# Patient Record
Sex: Male | Born: 1950
Health system: Southern US, Community
[De-identification: ages and names within clinical notes are randomized; demographics above are authoritative.]

## PROBLEM LIST (undated history)

## (undated) DIAGNOSIS — F419 Anxiety disorder, unspecified: Secondary | ICD-10-CM

## (undated) DIAGNOSIS — N529 Male erectile dysfunction, unspecified: Secondary | ICD-10-CM

## (undated) DIAGNOSIS — M47819 Spondylosis without myelopathy or radiculopathy, site unspecified: Secondary | ICD-10-CM

## (undated) DIAGNOSIS — M1711 Unilateral primary osteoarthritis, right knee: Secondary | ICD-10-CM

## (undated) DIAGNOSIS — I10 Essential (primary) hypertension: Secondary | ICD-10-CM

## (undated) HISTORY — DX: Unilateral primary osteoarthritis, right knee: M17.11

## (undated) HISTORY — DX: Anxiety disorder, unspecified: F41.9

## (undated) HISTORY — DX: Male erectile dysfunction, unspecified: N52.9

## (undated) HISTORY — DX: Spondylosis without myelopathy or radiculopathy, site unspecified: M47.819

## (undated) HISTORY — DX: Essential (primary) hypertension: I10

---

## 1998-09-15 ENCOUNTER — Emergency Department (HOSPITAL_COMMUNITY): Admission: EM | Admit: 1998-09-15 | Discharge: 1998-09-15 | Payer: Self-pay | Admitting: Emergency Medicine

## 2004-09-17 ENCOUNTER — Ambulatory Visit: Payer: Self-pay | Admitting: Family Medicine

## 2004-11-23 ENCOUNTER — Encounter: Admission: RE | Admit: 2004-11-23 | Discharge: 2004-11-23 | Payer: Self-pay | Admitting: Family Medicine

## 2004-12-04 ENCOUNTER — Ambulatory Visit: Payer: Self-pay | Admitting: Family Medicine

## 2005-02-26 ENCOUNTER — Ambulatory Visit: Payer: Self-pay | Admitting: Family Medicine

## 2005-06-03 ENCOUNTER — Ambulatory Visit: Payer: Self-pay | Admitting: Family Medicine

## 2005-08-19 ENCOUNTER — Ambulatory Visit: Payer: Self-pay | Admitting: Family Medicine

## 2005-11-12 ENCOUNTER — Ambulatory Visit: Payer: Self-pay | Admitting: Family Medicine

## 2006-01-28 ENCOUNTER — Ambulatory Visit: Payer: Self-pay | Admitting: Family Medicine

## 2006-04-28 ENCOUNTER — Ambulatory Visit: Payer: Self-pay | Admitting: Family Medicine

## 2006-06-30 ENCOUNTER — Ambulatory Visit: Payer: Self-pay | Admitting: Family Medicine

## 2006-08-04 ENCOUNTER — Ambulatory Visit (HOSPITAL_BASED_OUTPATIENT_CLINIC_OR_DEPARTMENT_OTHER): Admission: RE | Admit: 2006-08-04 | Discharge: 2006-08-04 | Payer: Self-pay | Admitting: Family Medicine

## 2006-08-05 ENCOUNTER — Encounter: Payer: Self-pay | Admitting: Pulmonary Disease

## 2006-08-12 ENCOUNTER — Ambulatory Visit: Payer: Self-pay | Admitting: Pulmonary Disease

## 2006-08-20 ENCOUNTER — Ambulatory Visit: Payer: Self-pay | Admitting: Family Medicine

## 2006-09-01 ENCOUNTER — Ambulatory Visit: Payer: Self-pay | Admitting: Pulmonary Disease

## 2006-10-01 ENCOUNTER — Ambulatory Visit: Payer: Self-pay | Admitting: Pulmonary Disease

## 2006-10-15 ENCOUNTER — Ambulatory Visit: Payer: Self-pay | Admitting: Family Medicine

## 2007-01-19 ENCOUNTER — Ambulatory Visit: Payer: Self-pay | Admitting: Family Medicine

## 2007-06-08 ENCOUNTER — Encounter: Payer: Self-pay | Admitting: Family Medicine

## 2007-06-28 DIAGNOSIS — I10 Essential (primary) hypertension: Secondary | ICD-10-CM | POA: Insufficient documentation

## 2007-06-29 ENCOUNTER — Ambulatory Visit: Payer: Self-pay | Admitting: Family Medicine

## 2007-06-29 DIAGNOSIS — M479 Spondylosis, unspecified: Secondary | ICD-10-CM | POA: Insufficient documentation

## 2007-06-29 DIAGNOSIS — F528 Other sexual dysfunction not due to a substance or known physiological condition: Secondary | ICD-10-CM

## 2007-08-04 ENCOUNTER — Telehealth: Payer: Self-pay | Admitting: Family Medicine

## 2007-09-14 ENCOUNTER — Telehealth: Payer: Self-pay | Admitting: Family Medicine

## 2007-10-28 ENCOUNTER — Telehealth: Payer: Self-pay | Admitting: Family Medicine

## 2007-11-05 ENCOUNTER — Encounter: Payer: Self-pay | Admitting: Family Medicine

## 2007-12-21 ENCOUNTER — Ambulatory Visit: Payer: Self-pay | Admitting: Family Medicine

## 2008-01-25 ENCOUNTER — Telehealth: Payer: Self-pay | Admitting: Family Medicine

## 2008-02-04 ENCOUNTER — Ambulatory Visit: Payer: Self-pay | Admitting: Pulmonary Disease

## 2008-02-04 DIAGNOSIS — G4733 Obstructive sleep apnea (adult) (pediatric): Secondary | ICD-10-CM

## 2008-02-10 ENCOUNTER — Encounter: Payer: Self-pay | Admitting: Pulmonary Disease

## 2008-07-13 ENCOUNTER — Ambulatory Visit: Payer: Self-pay | Admitting: Family Medicine

## 2008-07-13 DIAGNOSIS — M549 Dorsalgia, unspecified: Secondary | ICD-10-CM | POA: Insufficient documentation

## 2008-08-05 ENCOUNTER — Encounter: Payer: Self-pay | Admitting: Pulmonary Disease

## 2008-12-25 ENCOUNTER — Encounter: Payer: Self-pay | Admitting: Family Medicine

## 2009-01-31 ENCOUNTER — Ambulatory Visit: Payer: Self-pay | Admitting: Family Medicine

## 2009-01-31 DIAGNOSIS — F411 Generalized anxiety disorder: Secondary | ICD-10-CM | POA: Insufficient documentation

## 2009-02-19 ENCOUNTER — Ambulatory Visit: Payer: Self-pay | Admitting: Pulmonary Disease

## 2009-07-11 ENCOUNTER — Ambulatory Visit: Payer: Self-pay | Admitting: Family Medicine

## 2009-07-11 DIAGNOSIS — M25569 Pain in unspecified knee: Secondary | ICD-10-CM

## 2009-11-02 ENCOUNTER — Telehealth: Payer: Self-pay | Admitting: Family Medicine

## 2009-11-14 ENCOUNTER — Ambulatory Visit: Payer: Self-pay | Admitting: Family Medicine

## 2009-11-14 LAB — CONVERTED CEMR LAB
Bilirubin Urine: NEGATIVE
Blood in Urine, dipstick: NEGATIVE
Glucose, Urine, Semiquant: NEGATIVE
Specific Gravity, Urine: 1.02
WBC Urine, dipstick: NEGATIVE
pH: 5.5

## 2009-11-21 ENCOUNTER — Ambulatory Visit: Payer: Self-pay | Admitting: Family Medicine

## 2009-11-21 DIAGNOSIS — M545 Low back pain: Secondary | ICD-10-CM

## 2009-11-21 LAB — CONVERTED CEMR LAB
Albumin: 4 g/dL (ref 3.5–5.2)
BUN: 13 mg/dL (ref 6–23)
Basophils Absolute: 0 10*3/uL (ref 0.0–0.1)
CO2: 32 meq/L (ref 19–32)
Cholesterol: 187 mg/dL (ref 0–200)
Eosinophils Absolute: 0.1 10*3/uL (ref 0.0–0.7)
Glucose, Bld: 97 mg/dL (ref 70–99)
HCT: 41 % (ref 39.0–52.0)
HDL: 47.2 mg/dL (ref >39.00)
Hemoglobin: 13.7 g/dL (ref 13.0–17.0)
Hgb A1c MFr Bld: 5.6 % (ref 4.6–6.5)
Lymphs Abs: 1.3 10*3/uL (ref 0.7–4.0)
MCHC: 33.5 g/dL (ref 30.0–36.0)
Monocytes Absolute: 0.4 10*3/uL (ref 0.1–1.0)
Neutro Abs: 2 10*3/uL (ref 1.4–7.7)
Platelets: 242 10*3/uL (ref 150.0–400.0)
Potassium: 3.9 meq/L (ref 3.5–5.1)
RDW: 12.6 % (ref 11.5–14.6)
Sodium: 141 meq/L (ref 135–145)
TSH: 2.58 microintl units/mL (ref 0.35–5.50)

## 2010-03-21 ENCOUNTER — Encounter: Payer: Self-pay | Admitting: Pulmonary Disease

## 2010-04-03 ENCOUNTER — Ambulatory Visit: Payer: Self-pay | Admitting: Pulmonary Disease

## 2010-05-06 ENCOUNTER — Encounter: Payer: Self-pay | Admitting: Pulmonary Disease

## 2010-06-11 ENCOUNTER — Telehealth: Payer: Self-pay | Admitting: Family Medicine

## 2010-08-22 ENCOUNTER — Ambulatory Visit: Payer: Self-pay | Admitting: Family Medicine

## 2010-08-22 DIAGNOSIS — E782 Mixed hyperlipidemia: Secondary | ICD-10-CM | POA: Insufficient documentation

## 2010-10-21 ENCOUNTER — Telehealth: Payer: Self-pay | Admitting: Family Medicine

## 2010-11-14 NOTE — Progress Notes (Signed)
Summary: NEW RX  Phone Note Call from Patient Call back at Home Phone [phone]   Caller: Patient Call For: Carl Sheen MD Summary of Call: PT NEEDS NEW RX ALPRAZOLAM HE WILL PICK UP Initial call taken by: Heron Sabins,  June 11, 2010 11:02 AM  Follow-up for Phone Call        Please advise refill? Follow-up by: Josph Macho RMA,  June 11, 2010 11:35 AM  Additional Follow-up for Phone Call Additional follow up Details #1::        It does not appear he is early so keep same sig, give 90 tabs with 1 rf til his Dr gets back Additional Follow-up by: Danise Edge MD,  June 11, 2010 3:57 PM    Additional Follow-up for Phone Call Additional follow up Details #2::    Pt informed that RX is up front. Follow-up by: Josph Macho RMA,  June 11, 2010 4:01 PM  Prescriptions: ALPRAZOLAM 2 MG  TABS (ALPRAZOLAM) 1 by mouth three times a day as needed stress anxiety  #90 x 1   Entered by:   Josph Macho RMA   Authorized by:   Danise Edge MD   Signed by:   Josph Macho RMA on 06/11/2010   Method used:   Print then Give to Patient   RxID:   [id number]

## 2010-11-14 NOTE — Assessment & Plan Note (Signed)
Summary: rov for osa   CC:  1 year f/u osa - Wears cpap 8 -12 hrs per night - Mask fits good.  History of Present Illness: the pt comes in today for f/u of his known osa.  He has been wearing cpap compliantly, and reports no issues with his mask or cpap pressure.  He feels that he is sleeping well, and is satisfied with his daytime alertness.  He wears his cpap by report 8-12hrs a night.  Current Medications (verified): 1)  Alprazolam 2 Mg  Tabs (Alprazolam) .[name].. 1 By Mouth Three Times A Day As Needed Stress Anxiety 2)  Diclofenac Sodium 75 Mg  Tbec (Diclofenac Sodium) .... Two Times A Day--Alternates With Tylenol Arthritis 3)  Micardis Hct 80-25 Mg  Tabs (Telmisartan-Hctz) .... One Once Daily 4)  Tylenol Arthritis Pain 650 Mg  Tbcr (Acetaminophen) .... One Hs--Alternates With Diclofenac 5)  Centrum Performance   Tabs (Specialty Vitamins Products) .... Once Daily 6)  Albertsons Vitamin C 1000 Mg  Tabs (Ascorbic Acid) .... Two To Three Per Day 7)  Calcium 500 + D 500-125 Mg-Unit  Tabs (Calcium Carbonate-Vitamin D) .... Once Daily 8)  Vitamin B-12 Cr 1000 Mcg  Tbcr (Cyanocobalamin) .... Qd 9)  Bl Vitamin E Natural 400 Unit  Caps (Vitamin E) .... Once Daily 10)  Fish Oil   Oil (Fish Oil) .... Once Daily 11)  Viagra 100 Mg  Tabs (Sildenafil Citrate) .... One Tab As Instructed 12)  Cyclobenzaprine Hcl 10 Mg  Tabs (Cyclobenzaprine Hcl) .[name].. 1 By Mouth Three Times A Day As Needed Back Spasms  Allergies (verified): No Known Drug Allergies  Review of Systems  The patient denies shortness of breath with activity, shortness of breath at rest, productive cough, non-productive cough, coughing up blood, chest pain, irregular heartbeats, indigestion, loss of appetite, weight change, abdominal pain, difficulty swallowing, sore throat, tooth/dental problems, headaches, nasal congestion/difficulty breathing through nose, sneezing, itching, ear ache, anxiety, depression, hand/feet swelling, joint stiffness  or pain, rash, change in color of mucus, and fever.    Vital Signs:  Patient profile:   60 year old male Height:      70 inches Weight:      177 pounds O2 Sat:      98 % on Room air Temp:     98.3 degrees F oral Pulse rate:   97 / minute BP sitting:   138 / 80  (right arm) Cuff size:   regular  Vitals Entered By: Abigail Miyamoto RN (April 03, 2010 10:42 AM)  O2 Flow:  Room air  Physical Exam  General:  wd male in nad Nose:  no skin breakdown or pressure necrosis from cpap mask Extremities:  no edema or cyanosis Neurologic:  alert, not sleepy, moves all 4.   Impression & Recommendations:  Problem # 1:  OBSTRUCTIVE SLEEP APNEA (ICD-327.23)  the pt appears to be doing well with his cpap, and is sleeping well with no daytime alertness issues.  I have asked him to continue on cpap, and to keep up with mask changes and supplies.  He will followup with me in one year, or sooner if having sleep issues.  Medications Added to Medication List This Visit: 1)  Viagra 100 Mg Tabs (Sildenafil citrate) .... One tab as instructed  Other Orders: Est. Patient Level III (04540)  Patient Instructions: 1)  continue on cpap 2)  keep up with supplies 3)  followup with me in 12mos.

## 2010-11-14 NOTE — Progress Notes (Signed)
Summary: refill xanax  Phone Note Refill Request   Refills Requested: Medication #1:  ALPRAZOLAM 2 MG  TABS 1 by mouth three times a day as needed stress anxiety Initial call taken by: Alfred Levins, CMA,  October 21, 2010 3:15 PM  Follow-up for Phone Call        rx called in. Follow-up by: Romualdo Bolk, CMA (AAMA),  October 22, 2010 1:44 PM  Additional Follow-up for Phone Call Additional follow up Details #1::        refilled    Prescriptions: ALPRAZOLAM 2 MG  TABS (ALPRAZOLAM) 1 by mouth three times a day as needed stress anxiety  #90 x 4   Entered by:   Romualdo Bolk, CMA (AAMA)   Authorized by:   Judithann Sheen MD   Signed by:   Romualdo Bolk, CMA (AAMA) on 10/22/2010   Method used:   Telephoned to ...       [facility] [phone]* (retail)       [address] [place], [place]  [address]       Ph: [phone] or [phone]       Fax: [phone]   RxID:   [id number]

## 2010-11-14 NOTE — Progress Notes (Signed)
Summary: fasting lab/blood type  Phone Note Call from Patient Call back at Home Phone [phone]   Caller: Patient Call For: Judithann Sheen MD Summary of Call: pt would like to come in for fasting labs also pt is requesting blood typing to be done. Pt last ov 06-2009, pt stated doc req bloodwork. pls advised  Initial call taken by: Heron Sabins,  November 02, 2009 3:17 PM  Follow-up for Phone Call        ok he needs cpx  and he would need [facility]  Follow-up by: Pura Spice, RN,  November 06, 2009 8:57 AM  Additional Follow-up for Phone Call Additional follow up Details #1::        CPX AND CPX LABS HAS BEEN [facility] Additional Follow-up by: Heron Sabins,  November 07, 2009 5:11 PM

## 2010-11-14 NOTE — Assessment & Plan Note (Signed)
Summary: CPX/NJR   Vital Signs:  Patient profile:   60 year old male Height:      70 inches Weight:      181 pounds BMI:     26.06 O2 Sat:      95 % Temp:     98.1 degrees F Pulse rate:   72 / minute BP sitting:   120 / 80  (left arm)  Vitals Entered By: Pura Spice, RN (November 21, 2009 1:53 PM) CC: go over problems.  refill meds states had colonscopy in 2008 and to repeat in 5 yrs per Dr Berkley Harvey from [place]  Is Patient Diabetic? No Pain Assessment Patient in pain? 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 ESSENTIALLY NO COMPLAINTS, ON DISABILITY FROM BACK INJURY HAS PAIN BACK AT TIMES BUT NEEDS NOTHING MORE THAN  DICLOFENAC patient is active and has no limitations as far as his activities or anything he desires to do He has been having his physical examination by a cardiologist but has no cardiac problems but does have hypertension which is under good control He complained of some anxiety which is relieved with alprazolam as needed   Preventive Screening-Counseling & Management  Alcohol-Tobacco     Smoking Status: quit > 6 months     Year Quit: 1999  Allergies (verified): No Known Drug Allergies  Past History:  Past Surgical History: Last updated: 06/28/2007 Denies surgical history  Social History: Last updated: 06/28/2007 Retired Single Former Smoker  Past Medical History: Current Problems:  ERECTILE DYSFUNCTION, MILD (ICD-302.72) ARTHRITIS, SPINE (ICD-721.90) HYPERTENSION (ICD-401. ARTHRITIS RT KNEE    Past History:  Care Management: Gastroenterology:  Deboraha Sprang Dr Berkley Harvey Ophthalmology: Dr Nile Riggs  Urology:  Dr Vonita Moss   Social History: Smoking Status:  quit > 6 months  Review of Systems      See HPI General:  See HPI; Denies chills, fatigue, fever, loss of appetite, malaise, sleep disorder, sweats, weakness, and weight loss. Eyes:  Denies blurring, discharge, double vision, eye irritation, eye pain,  halos, itching, light sensitivity, red eye, vision loss-1 eye, and vision loss-both eyes. ENT:  Denies decreased hearing, difficulty swallowing, ear discharge, earache, hoarseness, nasal congestion, nosebleeds, postnasal drainage, ringing in ears, sinus pressure, and sore throat. CV:  Denies bluish discoloration of lips or nails, chest pain or discomfort, difficulty breathing at night, difficulty breathing while lying down, fainting, fatigue, leg cramps with exertion, lightheadness, near fainting, palpitations, shortness of breath with exertion, swelling of feet, swelling of hands, and weight gain. Resp:  Denies chest discomfort, chest pain with inspiration, cough, coughing up blood, excessive snoring, hypersomnolence, morning headaches, pleuritic, shortness of breath, sputum productive, and wheezing. GI:  Denies abdominal pain, bloody stools, change in bowel habits, constipation, dark tarry stools, diarrhea, excessive appetite, gas, hemorrhoids, indigestion, loss of appetite, nausea, vomiting, vomiting blood, and yellowish skin color. GU:  Complains of erectile dysfunction. MS:  Complains of low back pain. Derm:  Denies changes in color of skin, changes in nail beds, dryness, excessive perspiration, flushing, hair loss, insect bite(s), itching, lesion(s), poor wound healing, and rash. Neuro:  Denies brief paralysis, difficulty with concentration, disturbances in coordination, falling down, headaches, inability to speak, memory loss, numbness, poor balance, seizures, sensation of room spinning, tingling, tremors, visual disturbances, and weakness. Psych:  Complains of anxiety; denies alternate hallucination ( auditory/visual), depression, easily angered, easily tearful, irritability, mental problems, panic attacks, sense of great danger, suicidal thoughts/plans,  thoughts of violence, unusual visions or sounds, and thoughts /plans of harming others. Endo:  Denies cold intolerance, excessive hunger,  excessive thirst, excessive urination, heat intolerance, polyuria, and weight change.  Physical Exam  General:  Well-developed,well-nourished,in no acute distress; alert,appropriate and cooperative throughout examination Head:  Normocephalic and atraumatic without obvious abnormalities. No apparent alopecia or balding. Eyes:  No corneal or conjunctival inflammation noted. EOMI. Perrla. Funduscopic exam benign, without hemorrhages, exudates or papilledema. Vision grossly normal. Ears:  External ear exam shows no significant lesions or deformities.  Otoscopic examination reveals clear canals, tympanic membranes are intact bilaterally without bulging, retraction, inflammation or discharge. Hearing is grossly normal bilaterally. Nose:  External nasal examination shows no deformity or inflammation. Nasal mucosa are pink and moist without lesions or exudates. Mouth:  Oral mucosa and oropharynx without lesions or exudates.  Teeth in good repair. Neck:  No deformities, masses, or tenderness noted. Chest Wall:  No deformities, masses, tenderness or gynecomastia noted. Breasts:  No masses or gynecomastia noted Lungs:  Normal respiratory effort, chest expands symmetrically. Lungs are clear to auscultation, no crackles or wheezes. Heart:  Normal rate and regular rhythm. S1 and S2 normal without gallop, murmur, click, rub or other extra sounds. Abdomen:  Bowel sounds positive,abdomen soft and non-tender without masses, organomegaly or hernias noted. Rectal:  examined by Dr. Carlene Coria:  Testes bilaterally descended without nodularity, tenderness or masses. No scrotal masses or lesions. No penis lesions or urethral discharge. Prostate:  examined by Dr. Shiela Mayer Msk:  No deformity or scoliosis noted of thoracic or lumbar spine.   Pulses:  R and L carotid,radial,femoral,dorsalis pedis and posterior tibial pulses are full and equal bilaterally Extremities:  No clubbing, cyanosis, edema, or deformity  noted with normal full range of motion of all joints.   Neurologic:  No cranial nerve deficits noted. Station and gait are normal. Plantar reflexes are down-going bilaterally. DTRs are symmetrical throughout. Sensory, motor and coordinative functions appear intact. Skin:  Intact without suspicious lesions or rashes Cervical Nodes:  No lymphadenopathy noted Axillary Nodes:  No palpable lymphadenopathy Inguinal Nodes:  No significant adenopathy Psych:  Cognition and judgment appear intact. Alert and cooperative with normal attention span and concentration. No apparent delusions, illusions, hallucinations   Impression & Recommendations:  Problem # 1:  LOW BACK PAIN, CHRONIC (ICD-724.2) Assessment Unchanged  His updated medication list for this problem includes:    Diclofenac Sodium 75 Mg Tbec (Diclofenac sodium) .[name]..[name] Two times a day--alternates with tylenol arthritis    Tylenol Arthritis Pain 650 Mg Tbcr (Acetaminophen) ..... One hs--alternates with diclofenac    Cyclobenzaprine Hcl 10 Mg Tabs (Cyclobenzaprine hcl) .[name]... 1 by mouth three times a day as needed back spasms  Problem # 2:  ARTHRITIS, KNEE (ICD-716.96) Assessment: Unchanged  Problem # 3:  ANXIETY (ICD-300.00) Assessment: Improved  His updated medication list for this problem includes:    Alprazolam 2 Mg Tabs (Alprazolam) .[name]... 1 by mouth three times a day as needed stress anxiety  Problem # 4:  MUSCLE SPASM, BACK (ICD-724.8) Assessment: Unchanged  His updated medication list for this problem includes:    Diclofenac Sodium 75 Mg Tbec (Diclofenac sodium) .[name]..[name] Two times a day--alternates with tylenol arthritis    Tylenol Arthritis Pain 650 Mg Tbcr (Acetaminophen) ..... One hs--alternates with diclofenac    Cyclobenzaprine Hcl 10 Mg Tabs (Cyclobenzaprine hcl) .[name]... 1 by mouth three times a day as needed back spasms  Problem # 5:  OBSTRUCTIVE SLEEP APNEA (ICD-327.23) Assessment: Improved CPAP  Problem # 6:  ERECTILE  DYSFUNCTION, MILD (ICD-302.72) Assessment: Unchanged  His updated medication list for this problem includes:    Viagra 100 Mg Tabs (Sildenafil citrate) ..... One tab as instructed  Problem # 7:  PHYSICAL EXAMINATION (ICD-V70.0) Assessment: New  Problem # 8:  HYPERTENSION (ICD-401.9) Assessment: Improved  His updated medication list for this problem includes:    Micardis Hct 80-25 Mg Tabs (Telmisartan-hctz) ..... One once daily  Orders: EKG w/ Interpretation ([address])  Complete Medication List: 1)  Alprazolam 2 Mg Tabs (Alprazolam) .[name].. 1 by mouth three times a day as needed stress anxiety 2)  Diclofenac Sodium 75 Mg Tbec (Diclofenac sodium) .... Two times a day--alternates with tylenol arthritis 3)  Micardis Hct 80-25 Mg Tabs (Telmisartan-hctz) .... One once daily 4)  Tylenol Arthritis Pain 650 Mg Tbcr (Acetaminophen) .... One hs--alternates with diclofenac 5)  Centrum Performance Tabs (Specialty vitamins products) .... Once daily 6)  [employer]ber[employer]ons Vitamin C 1000 Mg Tabs (Ascorbic acid) .... Two to three per day 7)  Calcium 500 + D 500-125 Mg-unit Tabs (Calcium carbonate-vitamin d) .... Once daily 8)  Vitamin B-12 Cr 1000 Mcg Tbcr (Cyanocobalamin) .... Qd 9)  Bl Vitamin E Natural 400 Unit Caps (Vitamin e) .... Once daily 10)  Fish Oil Oil (Fish oil) .... Once daily 11)  Viagra 100 Mg Tabs (Sildenafil citrate) .... One tab as instructed 12)  Cyclobenzaprine Hcl 10 Mg Tabs (Cyclobenzaprine hcl) .[name].. 1 by mouth three times a day as needed back spasms  Patient Instructions: 1)  posterior doing fine and we should continue the same medication after her studies are good 2)  We'll refill medicines 3)  blood pressure well controlled return in 6 months Prescriptions: CYCLOBENZAPRINE HCL 10 MG  TABS (CYCLOBENZAPRINE HCL) 1 by mouth three times a day as needed back spasms  #90 x 11   Entered and Authorized by:   Judithann Sheen MD   Signed by:   Judithann Sheen MD on 11/21/2009    Method used:   Electronically to        [facility] [phone]* (retail)       [address] [place], [place]  [address]       Ph: [phone] or [phone]       Fax: [phone]   RxID:   [id number] VIAGRA 100 MG  TABS (SILDENAFIL CITRATE) one tab as instructed  #4 x 11   Entered and Authorized by:   Judithann Sheen MD   Signed by:   Judithann Sheen MD on 11/21/2009   Method used:   Electronically to        [facility] [phone]* (retail)       [address] [place], [place]  [address]       Ph: [phone] or [phone]       Fax: [phone]   RxID:   [id number] MICARDIS HCT 80-25 MG  TABS (TELMISARTAN-HCTZ) one once daily  #30 x 11   Entered and Authorized by:   Judithann Sheen MD   Signed by:   Judithann Sheen MD on 11/21/2009   Method used:   Electronically to        [facility] [phone]* (retail)       [address] [place], [place]  [address]       Ph: [phone] or [phone]       Fax: [phone]   RxID:   [id number] DICLOFENAC SODIUM 75 MG  TBEC (DICLOFENAC SODIUM) two times a day--alternates with tylenol arthritis  #60 x 11   Entered and Authorized by:   Judithann Sheen MD   Signed by:   Judithann Sheen MD on 11/21/2009   Method used:   Electronically to        [facility] [phone]* (retail)       [address] [place], [place]  [address]       Ph: [phone] or [phone]       Fax: [phone]   RxID:   [id number] ALPRAZOLAM 2 MG  TABS (ALPRAZOLAM) 1 by mouth three times a day as needed stress anxiety  #90 x 5   Entered and Authorized by:   Judithann Sheen MD   Signed by:   Judithann Sheen MD on 11/21/2009   Method used:   Print then Give to Patient   RxID:   [id number]

## 2010-11-14 NOTE — Letter (Signed)
Summary: CMN request for CPAP Supplies/[facility]  CMN request for CPAP Supplies/[facility]   Imported By: Sherian Rein 03/25/2010 12:19:47  _____________________________________________________________________  External Attachment:    Type:   Image     Comment:   External Document

## 2010-11-14 NOTE — Progress Notes (Signed)
Summary: REFILLS  Phone Note Refill Request   Refills Requested: Medication #1:  CYCLOBENZAPRINE HCL 10 MG  TABS 1 by mouth three times a day as needed back spasms.  Medication #2:  DICLOFENAC SODIUM 75 MG  TBEC two times a day--alternates with tylenol arthritis  Medication #3:  MICARDIS HCT 80-25 MG  TABS one once daily PT. WANTS REFILLS ON THE MEDICATION.   Initial call taken by: Georgian Co,  October 21, 2010 12:49 PM    Prescriptions: CYCLOBENZAPRINE HCL 10 MG  TABS (CYCLOBENZAPRINE HCL) 1 by mouth three times a day as needed back spasms  #90 x 1   Entered by:   Alfred Levins, CMA   Authorized by:   Judithann Sheen MD   Signed by:   Alfred Levins, CMA on 10/21/2010   Method used:   Electronically to        [facility] [phone]* (retail)       [address] [place], [place]  [address]       Ph: [phone] or [phone]       Fax: [phone]   RxID:   [id number] MICARDIS HCT 80-25 MG  TABS (TELMISARTAN-HCTZ) one once daily  #30 x 11   Entered by:   Alfred Levins, CMA   Authorized by:   Judithann Sheen MD   Signed by:   Alfred Levins, CMA on 10/21/2010   Method used:   Electronically to        [facility] [phone]* (retail)       [address] [place], [place]  [address]       Ph: [phone] or [phone]       Fax: [phone]   RxID:   [id number] DICLOFENAC SODIUM 75 MG  TBEC (DICLOFENAC SODIUM) two times a day--alternates with tylenol arthritis  #60 x 5   Entered by:   Alfred Levins, CMA   Authorized by:   Judithann Sheen MD   Signed by:   Alfred Levins, CMA on 10/21/2010   Method used:   Electronically to        [facility] [phone]* (retail)       [address] [place], [place]  [address]       Ph: [phone] or [phone]       Fax: [phone]   RxID:   [id number]

## 2010-11-14 NOTE — Assessment & Plan Note (Signed)
Summary: MED CK / REFILL  // RS   Vital Signs:  Patient profile:   60 year old male Height:      70 inches (177.80 cm) Weight:      170.31 pounds (77.41 kg) BMI:     24.53 O2 Sat:      92 % on Room air Temp:     97.7 degrees F (36.50 degrees C) oral Pulse rate:   74 / minute BP sitting:   122 / 80  (left arm) Cuff size:   regular  Vitals Entered By: Josph Macho RMA (August 22, 2010 8:26 AM)  O2 Flow:  Room air CC: Med check/ refill/ CF Is Patient Diabetic? No   History of Present Illness: Patient is 60 yo AA male in today for medication refills and follow up on Hypertension. He reports he feels well. No recent illness/f/c/CP/palp/SOB/GI or GU c/o.  Preventive Screening-Counseling & Management  Alcohol-Tobacco     Smoking Status: current  Current Medications (verified): 1)  Alprazolam 2 Mg  Tabs (Alprazolam) .[name].. 1 By Mouth Three Times A Day As Needed Stress Anxiety 2)  Diclofenac Sodium 75 Mg  Tbec (Diclofenac Sodium) .... Two Times A Day--Alternates With Tylenol Arthritis 3)  Micardis Hct 80-25 Mg  Tabs (Telmisartan-Hctz) .... One Once Daily 4)  Tylenol Arthritis Pain 650 Mg  Tbcr (Acetaminophen) .... One Hs--Alternates With Diclofenac 5)  Centrum Performance   Tabs (Specialty Vitamins Products) .... Once Daily 6)  [employer] Vitamin C 1000 Mg  Tabs (Ascorbic Acid) .... Two To Three Per Day 7)  Calcium 500 + D 500-125 Mg-Unit  Tabs (Calcium Carbonate-Vitamin D) .... Once Daily 8)  Vitamin B-12 Cr 1000 Mcg  Tbcr (Cyanocobalamin) .... Qd 9)  Bl Vitamin E Natural 400 Unit  Caps (Vitamin E) .... Once Daily 10)  Fish Oil   Oil (Fish Oil) .... Once Daily 11)  Viagra 100 Mg  Tabs (Sildenafil Citrate) .... One Tab As Instructed 12)  Cyclobenzaprine Hcl 10 Mg  Tabs (Cyclobenzaprine Hcl) .[name].. 1 By Mouth Three Times A Day As Needed Back Spasms  Allergies (verified): No Known Drug Allergies  Past History:  Past medical history reviewed for relevance to current acute and  chronic problems. Social history (including risk factors) reviewed for relevance to current acute and chronic problems.  Past Medical History: Reviewed history from 11/21/2009 and no changes required. Current Problems:  ERECTILE DYSFUNCTION, MILD (ICD-302.72) ARTHRITIS, SPINE (ICD-721.90) HYPERTENSION (ICD-401. ARTHRITIS RT KNEE    Social History: Reviewed history from 06/28/2007 and no changes required. Retired Single Former Smoker, stopped cigarettes 10 years ago Current Smoker of cigars in pmSmoking Status:  current  Review of Systems      See HPI  Physical Exam  General:  Well-developed,well-nourished,in no acute distress; alert,appropriate and cooperative throughout examination Head:  Normocephalic and atraumatic without obvious abnormalities. No apparent alopecia or balding. Mouth:  Oral mucosa and oropharynx without lesions or exudates.  Teeth in good repair. Neck:  No deformities, masses, or tenderness noted. Lungs:  Normal respiratory effort, chest expands symmetrically. Lungs are clear to auscultation, no crackles or wheezes. Heart:  Normal rate and regular rhythm. S1 and S2 normal without gallop, murmur, click, rub or other extra sounds. Abdomen:  Bowel sounds positive,abdomen soft and non-tender without masses, organomegaly or hernias noted. Msk:  No deformity or scoliosis noted of thoracic or lumbar spine.   Extremities:  No clubbing, cyanosis, edema, or deformity noted .   Cervical Nodes:  No lymphadenopathy noted Psych:  Cognition and judgment appear intact. Alert and cooperative with normal attention span and concentration. No apparent delusions, illusions, hallucinations   Impression & Recommendations:  Problem # 1:  HYPERTENSION (ICD-401.9)  His updated medication list for this problem includes:    Micardis Hct 80-25 Mg Tabs (Telmisartan-hctz) ..... One once daily Well controlled, no changes to current meds  Problem # 2:  OBSTRUCTIVE SLEEP APNEA  (ICD-327.23) Uses his CPAP every night.  Problem # 3:  MUSCLE SPASM, BACK (ICD-724.8)  His updated medication list for this problem includes:    Diclofenac Sodium 75 Mg Tbec (Diclofenac sodium) .[name]..[name] Two times a day--alternates with tylenol arthritis    Tylenol Arthritis Pain 650 Mg Tbcr (Acetaminophen) ..... One hs--alternates with diclofenac    Cyclobenzaprine Hcl 10 Mg Tabs (Cyclobenzaprine hcl) .[name]... 1 by mouth three times a day as needed back spasms Stable on current meds, no changes to meds  Complete Medication List: 1)  Alprazolam 2 Mg Tabs (Alprazolam) .[name].. 1 by mouth three times a day as needed stress anxiety 2)  Diclofenac Sodium 75 Mg Tbec (Diclofenac sodium) .... Two times a day--alternates with tylenol arthritis 3)  Micardis Hct 80-25 Mg Tabs (Telmisartan-hctz) .... One once daily 4)  Tylenol Arthritis Pain 650 Mg Tbcr (Acetaminophen) .... One hs--alternates with diclofenac 5)  Centrum Performance Tabs (Specialty vitamins products) .... Once daily 6)  [employer]ons Vitamin C 1000 Mg Tabs (Ascorbic acid) .... Two to three per day 7)  Calcium 500 + D 500-125 Mg-unit Tabs (Calcium carbonate-vitamin d) .... Once daily 8)  Vitamin B-12 Cr 1000 Mcg Tbcr (Cyanocobalamin) .... Qd 9)  Fish Oil Oil (Fish oil) .... Once daily 10)  Viagra 100 Mg Tabs (Sildenafil citrate) .... One tab as instructed 11)  Cyclobenzaprine Hcl 10 Mg Tabs (Cyclobenzaprine hcl) .[name].. 1 by mouth three times a day as needed back spasms  Patient Instructions: 1)  Please schedule a follow-up appointment in 6 months for annual with Dr Scotty Court 2)  Hepatic Panel prior to visit ICD-9: 401.1 3)  Lipid panel prior to visit ICD-9 : 272.4 4)  TSH prior to visit ICD-9 : 401.1 5)  CBC w/ Diff prior to visit ICD-9 : 401.1 6)  Urine- dip prior to visit ICD-9 : 401.1 7)  PSA prior to visit ICD-9: v70.0 8)  HgBA1c prior to visit  ICD-9:  9)  Stop smoking tips: Choose a quit date. Cut down before the quit date. Decide what you  will do as a substitute when you feel the urge to smoke(gum, toothpick, exercise).  Prescriptions: CYCLOBENZAPRINE HCL 10 MG  TABS (CYCLOBENZAPRINE HCL) 1 by mouth three times a day as needed back spasms  #90 x 1   Entered and Authorized by:   Danise Edge MD   Signed by:   Danise Edge MD on 08/22/2010   Method used:   Electronically to        [facility] [phone]* (retail)       [address] [place], [place]  [address]       Ph: [phone] or [phone]       Fax: [phone]   RxID:   [id number] VIAGRA 100 MG  TABS (SILDENAFIL CITRATE) one tab as instructed  #9 x 2   Entered and Authorized by:   Danise Edge MD   Signed by:   Danise Edge MD on 08/22/2010   Method used:   Electronically to  [facility]  [address] [phone]* (retail)       [address] [place], [place]  [address]       Ph: [phone] or [phone]       Fax: [phone]   RxID:   [id number] DICLOFENAC SODIUM 75 MG  TBEC (DICLOFENAC SODIUM) two times a day--alternates with tylenol arthritis  #60 x 1   Entered and Authorized by:   Danise Edge MD   Signed by:   Danise Edge MD on 08/22/2010   Method used:   Electronically to        [facility] [phone]* (retail)       [address] [place], [place]  [address]       Ph: [phone] or [phone]       Fax: [phone]   RxID:   [id number] MICARDIS HCT 80-25 MG  TABS (TELMISARTAN-HCTZ) one once daily  #30 x 2   Entered and Authorized by:   Danise Edge MD   Signed by:   Danise Edge MD on 08/22/2010   Method used:   Electronically to        [facility] [phone]* (retail)       [address] [place], [place]  [address]       Ph: [phone] or [phone]       Fax: [phone]   RxID:   [id number] ALPRAZOLAM 2 MG  TABS (ALPRAZOLAM) 1 by mouth three times a day as needed stress anxiety  #90 x 1   Entered and Authorized by:   Danise Edge MD   Signed by:   Danise Edge MD on 08/22/2010   Method  used:   Print then Give to Patient   RxID:   [id number]    Orders Added: 1)  Est. Patient Level IV [82423]

## 2011-02-17 ENCOUNTER — Telehealth: Payer: Self-pay | Admitting: Family Medicine

## 2011-02-17 NOTE — Telephone Encounter (Signed)
Refill Alprazolam at [facility].

## 2011-02-19 ENCOUNTER — Other Ambulatory Visit (INDEPENDENT_AMBULATORY_CARE_PROVIDER_SITE_OTHER): Payer: Medicare Other | Admitting: Family Medicine

## 2011-02-19 DIAGNOSIS — R972 Elevated prostate specific antigen [PSA]: Secondary | ICD-10-CM

## 2011-02-19 DIAGNOSIS — E039 Hypothyroidism, unspecified: Secondary | ICD-10-CM

## 2011-02-19 DIAGNOSIS — I10 Essential (primary) hypertension: Secondary | ICD-10-CM

## 2011-02-19 DIAGNOSIS — N390 Urinary tract infection, site not specified: Secondary | ICD-10-CM

## 2011-02-19 DIAGNOSIS — IMO0001 Reserved for inherently not codable concepts without codable children: Secondary | ICD-10-CM

## 2011-02-19 DIAGNOSIS — D649 Anemia, unspecified: Secondary | ICD-10-CM

## 2011-02-19 DIAGNOSIS — T887XXA Unspecified adverse effect of drug or medicament, initial encounter: Secondary | ICD-10-CM

## 2011-02-19 DIAGNOSIS — E785 Hyperlipidemia, unspecified: Secondary | ICD-10-CM

## 2011-02-19 LAB — HEPATIC FUNCTION PANEL
ALT: 27 U/L (ref 0–53)
AST: 23 U/L (ref 0–37)
Bilirubin, Direct: 0.2 mg/dL (ref 0.0–0.3)
Total Bilirubin: 1.1 mg/dL (ref 0.3–1.2)

## 2011-02-19 LAB — POCT URINALYSIS DIPSTICK
Leukocytes, UA: NEGATIVE
Nitrite, UA: NEGATIVE
Protein, UA: NEGATIVE
Urobilinogen, UA: 0.2

## 2011-02-19 LAB — CBC WITH DIFFERENTIAL/PLATELET
Eosinophils Relative: 9.4 % — ABNORMAL HIGH (ref 0.0–5.0)
HCT: 41.6 % (ref 39.0–52.0)
Lymphs Abs: 2 10*3/uL (ref 0.7–4.0)
Monocytes Relative: 10.4 % (ref 3.0–12.0)
Platelets: 253 10*3/uL (ref 150.0–400.0)
RBC: 4.45 Mil/uL (ref 4.22–5.81)
WBC: 4.9 10*3/uL (ref 4.5–10.5)

## 2011-02-19 LAB — BASIC METABOLIC PANEL
BUN: 14 mg/dL (ref 6–23)
GFR: 73.62 mL/min (ref >60.00)
Potassium: 4.2 mEq/L (ref 3.5–5.1)
Sodium: 140 mEq/L (ref 135–145)

## 2011-02-19 LAB — LIPID PANEL
LDL Cholesterol: 102 mg/dL — ABNORMAL HIGH (ref 0–99)
VLDL: 26.4 mg/dL (ref 0.0–40.0)

## 2011-02-19 LAB — PSA: PSA: 0.72 ng/mL (ref 0.10–4.00)

## 2011-02-19 LAB — HEMOGLOBIN A1C: Hgb A1c MFr Bld: 5.6 % (ref 4.6–6.5)

## 2011-02-20 ENCOUNTER — Other Ambulatory Visit: Payer: Self-pay

## 2011-02-20 MED ORDER — ALPRAZOLAM 2 MG PO TABS: 2.0000 mg | ORAL_TABLET | Freq: Three times a day (TID) | ORAL | Status: DC | PRN

## 2011-02-20 NOTE — Telephone Encounter (Signed)
Refill alprazolam 

## 2011-02-20 NOTE — Telephone Encounter (Signed)
rx phoned in to [facility] for alprazolam 5 refills---ok per Dr. Scotty Court

## 2011-02-24 ENCOUNTER — Other Ambulatory Visit: Payer: Self-pay | Admitting: Family Medicine

## 2011-02-27 ENCOUNTER — Encounter: Payer: Self-pay | Admitting: Family Medicine

## 2011-02-27 ENCOUNTER — Ambulatory Visit (INDEPENDENT_AMBULATORY_CARE_PROVIDER_SITE_OTHER): Payer: Medicare Other | Admitting: Family Medicine

## 2011-02-27 VITALS — BP 102/74 | HR 76 | Temp 97.9°F | Wt 167.0 lb

## 2011-02-27 DIAGNOSIS — M1990 Unspecified osteoarthritis, unspecified site: Secondary | ICD-10-CM

## 2011-02-27 DIAGNOSIS — M129 Arthropathy, unspecified: Secondary | ICD-10-CM

## 2011-02-27 DIAGNOSIS — I10 Essential (primary) hypertension: Secondary | ICD-10-CM

## 2011-02-27 DIAGNOSIS — N529 Male erectile dysfunction, unspecified: Secondary | ICD-10-CM

## 2011-02-27 DIAGNOSIS — M549 Dorsalgia, unspecified: Secondary | ICD-10-CM

## 2011-02-27 DIAGNOSIS — F411 Generalized anxiety disorder: Secondary | ICD-10-CM

## 2011-02-27 DIAGNOSIS — G8929 Other chronic pain: Secondary | ICD-10-CM

## 2011-02-27 DIAGNOSIS — F419 Anxiety disorder, unspecified: Secondary | ICD-10-CM

## 2011-02-27 MED ORDER — CYCLOBENZAPRINE HCL 10 MG PO TABS: 10.0000 mg | ORAL_TABLET | Freq: Three times a day (TID) | ORAL | Status: DC | PRN

## 2011-02-27 MED ORDER — DICLOFENAC SODIUM 75 MG PO TBEC: 75.0000 mg | DELAYED_RELEASE_TABLET | Freq: Two times a day (BID) | ORAL | Status: DC

## 2011-02-27 MED ORDER — SILDENAFIL CITRATE 100 MG PO TABS: 100.0000 mg | ORAL_TABLET | Freq: Every day | ORAL | Status: DC | PRN

## 2011-02-27 NOTE — Patient Instructions (Signed)
My impression is that you're doing very well on your present medications Anxiety and controlled with alprazolam also hypertension is well-controlled with myocardial Your chronic back pain is controlled with Flexeril and Tylenol,also at times use ibuprofen 80 mg 3 times daily Laboratory studies were  good

## 2011-02-27 NOTE — Progress Notes (Signed)
  Subjective:    Patient ID: Carl Trujillo, male    DOB: May 15, 1951, 60 y.o.   MRN: [id number] This 60 year old black male is in to discuss his medical problems and his laboratory studies as well as refill his necessary medicationsHe relates he continues to have problem with low back pain her chronic back pain and at times radiation of pain from the left SI joint down his left leg. But this is infrequent. He states taking Tylenol or ibuprofen and at times Flexeril helped his problem Hypertension is well controlled 102/74 he is on Micardis HCT He relates in general he is doing her well is very active and has several girl friends HPI    Review of Systemssee history of present illness     Objective:   Physical Exam  The patient is a well-developed well-nourished healthy-appearing black male who is alert talkative and in no distress HEENT negative carotids palpable no bruits thyroid normal Chest lungs clear to palpation percussion and mild rotation no rales no dullness no wheezing or Heart heart normal size regular rhythm no murmurs abdomen liver spleen kidneys are nonpalpable no masses felt no tenderness Genitalia normal rectal exam reveals normal prostate no other normality is Spine tenderness over the lower lumbar spine is especially the left SI joint no limitation of straight leg raising no muscle spasm at this time Neurological examination negative Skin no abnormalities noted       Assessment & Plan:  Patient is doing very well his problems were under control laboratory studies a good Hypertension minimal no change in treatment Chronic back pain no change in treatment to continue Tylenol ibuprofen and Flexeril Erectile dysfunction and mild to continue Viagra Anxiety episodic to be controlled with alprazolam

## 2011-02-28 NOTE — Assessment & Plan Note (Signed)
[facility]                             PULMONARY OFFICE NOTE   MASSIMO, HARTLAND                         MRN:          [id number]  DATE:09/01/2006                            DOB:          December 15, 1950    SLEEP MEDICINE CONSULTATION   HISTORY OF PRESENT ILLNESS:  The patient is a 60 year old, black male  who I have been asked to see for obstructive sleep apnea.  The patient  recently underwent nocturnal polysomnography on August 04, 2006, where  he was found to have moderate obstructive sleep apnea.  He had a  respiratory disturbance index of 33 events per hour and O2 saturation as  low as 70%.  The patient states that he typically goes to bed between 10  and 11:30 and gets up between 9 a.m. and 10 a.m. to start his day.  He  has been noted to have snoring as well as pauses in his breathing during  sleep.  The patient feels that he is fairly rested whenever he arises  but definitely notes sleep pressure with period of inactivity during the  day such as TV and reading.  The patient denies any sleepiness with  driving.  Of note, his weight is fairly neutral.   PAST MEDICAL HISTORY:  Significant for:  1. Hypertension.  2. History of multiple orthopedic complaints.   MEDICATIONS:  1. Micardis 80/25 one half tablet daily.  2. Xanax 2 mg t.i.d.  3. Diclofenac p.r.n.  4. Flexeril p.r.n.   The patient has no known drug allergies.   SOCIAL HISTORY:  He is single and does have children.  He has a history  of smoking cigarettes as well as cigars.   FAMILY HISTORY:  Remarkable for heart disease as well as cancer.   REVIEW OF SYMPTOMS:  As per the history of present illness.  Also see  patient intake form documented in the chart.   PHYSICAL EXAM:  GENERAL:  He is a thin, black male in no acute distress.  Blood pressure is 110/74, pulse 70, temperature is 97.8, weight is 169  pounds.  O2 saturation on room air is 96%.  HEENT:  Pupils equal round and  reactive to light and accommodation.  Extraocular muscles are intact.  Nares are mildly narrowed but patent.  Oropharynx does show mild elongation of soft palate and uvula.  There is  clearly a very small lower jaw with definite overbite.  NECK:  Supple without JVD or lymphadenopathy.  There is no palpable  thyromegaly.  CHEST:  Totally clear.  CARDIAC:  Regular rate and rhythm.  No murmurs, rubs, or gallops.  ABDOMEN:  Soft, nontender with good bowel sounds.  GENITAL:  Not done and not indicated.  RECTAL:  Not done and not indicated.  BREASTS:  Not done and not indicated.  Lower extremities are without edema.  Good pulses distally.  There is no  calf tenderness.  Neurologically, alert and oriented with no obvious observable motor  defects.   IMPRESSION:  Moderate obstructive sleep apnea documented by nocturnal  polysomnography.  The patient is not morbidly  obese nor is his upper  airway overly abnormal.  He does have a very small lower jaw with  overbite and states that he had major jaw surgery after trauma that was  never properly reconstructed.  At this point in time, I really think  that continuous positive airway pressure is his best treatment option  unless he wants to consider mandibular advancement.  At this point in  time, he thinks that he will try continuous positive airway pressure  first.   PLAN:  1. Initiate CPAP starting at 10 cm.  The patient will ultimately need      pressure optimization.  2. Followup in 4 weeks or sooner if there are problems.     Barbaraann Share, MD,FCCP  Electronically Signed    [facility]  DD: 09/01/2006  DT: 09/02/2006  Job #: [id number]   cc:   Ellin Saba., MD

## 2011-02-28 NOTE — Procedures (Signed)
NAME:  Carl Trujillo, Carl Trujillo NO.:  [id number]   MEDICAL RECORD NO.:  [id number]          PATIENT TYPE:  OUT   LOCATION:  SLEEP CENTER                 FACILITY:  [facility]   PHYSICIAN:  Barbaraann Share, MD,FCCPDATE OF BIRTH:  July 10[birth year] 1950   DATE OF STUDY:  08/04/2006                              NOCTURNAL POLYSOMNOGRAM   INDICATIONS FOR STUDY:  Hypersomnia with sleep apnea.   EPWORTH SCORE:  5.   SLEEP ARCHITECTURE:  The patient had total sleep time of 400 minutes with  decreased REM and never achieved slow wave sleep.  Sleep onset latency was  normal and REM onset was at the upper limits of normal.  Sleep efficiency  was decreased at 90%.   RESPIRATORY DATA:  The patient was found to have 134 apneas and 84 hypopneas  from a respiratory disturbance index of 33 events per hour.  The events were  not positional, but there was loud snoring throughout.   OXYGEN DATA:  There was O2 desaturation as low as 70% with the patient's  obstructive events.   CARDIAC DATA:  Rare PAC was noted without clinical significance.   MOVEMENT/PARASOMNIA:  Small numbers of leg jerks that were not clinically  significant.   IMPRESSION/RECOMMENDATION:  1. Moderate-to-severe obstructive sleep apnea/hypopnea syndrome with a      respiratory disturbance index of 33 events/hour and O2 desaturation as      low as 70%.  Optimal treatment for this degree of sleep apnea includes      weight loss if applicable, as well as CPAP therapy.  2. It should be noted that the patient took Xanax 2 mg at 9:30 p.m. the      night of the study in order to help him sleep.           ______________________________  Barbaraann Share, MD,FCCP  Diplomate, American Board of Sleep  Medicine     [facility]  D:  08/13/2006 17:04:22  T:  08/14/2006 09:47:57  Job:  [id number]

## 2011-03-29 ENCOUNTER — Encounter: Payer: Self-pay | Admitting: *Deleted

## 2011-04-03 ENCOUNTER — Ambulatory Visit (INDEPENDENT_AMBULATORY_CARE_PROVIDER_SITE_OTHER): Payer: Medicare Other | Admitting: Pulmonary Disease

## 2011-04-03 ENCOUNTER — Encounter: Payer: Self-pay | Admitting: Pulmonary Disease

## 2011-04-03 VITALS — BP 108/60 | HR 82 | Temp 98.1°F | Ht 70.0 in | Wt 175.2 lb

## 2011-04-03 DIAGNOSIS — G4733 Obstructive sleep apnea (adult) (pediatric): Secondary | ICD-10-CM

## 2011-04-03 NOTE — Patient Instructions (Signed)
Continue with cpap, keep up with mask changes and supplies Will need to consider replacing your cpap machine next year followup with me in one year.

## 2011-04-03 NOTE — Progress Notes (Signed)
  Subjective:    Patient ID: Carl Trujillo, male    DOB: Oct 06, 1951, 60 y.o.   MRN: [id number]  HPI The pt comes in today for f/u of his known osa.  He is wearing cpap compliantly, and denies any issues with mask or pressure.  He is sleeping well, and has no daytime sleepiness issues. His weight is stable since the last visit.    Review of Systems  Constitutional: Negative for fever and unexpected weight change.  HENT: Negative for ear pain, nosebleeds, congestion, sore throat, rhinorrhea, sneezing, trouble swallowing, dental problem, postnasal drip and sinus pressure.   Eyes: Negative for redness and itching.  Respiratory: Negative for cough, chest tightness, shortness of breath and wheezing.   Cardiovascular: Negative for palpitations and leg swelling.  Gastrointestinal: Negative for nausea and vomiting.  Genitourinary: Negative for dysuria.  Musculoskeletal: Negative for joint swelling.  Skin: Negative for rash.  Neurological: Negative for headaches.  Hematological: Does not bruise/bleed easily.  Psychiatric/Behavioral: Negative for dysphoric mood. The patient is not nervous/anxious.        Objective:   Physical Exam Thin male in nad No skin breakdown or pressure necrosis from cpap mask LE without edema, no cyanosis Alert, does not appear sleepy, moves all 4        Assessment & Plan:

## 2011-04-03 NOTE — Assessment & Plan Note (Signed)
The pt is doing well with cpap, and reports no issues with tolerance.  He feels he sleeps well with normal daytime alertness.  I have asked him to keep up with supplies and mask changes, and will probably be due for a new machine next year?

## 2011-08-18 ENCOUNTER — Other Ambulatory Visit: Payer: Self-pay | Admitting: Family Medicine

## 2011-08-22 ENCOUNTER — Other Ambulatory Visit: Payer: Self-pay | Admitting: Family Medicine

## 2011-08-22 MED ORDER — DICLOFENAC SODIUM 75 MG PO TBEC: 75.0000 mg | DELAYED_RELEASE_TABLET | Freq: Two times a day (BID) | ORAL | Status: DC

## 2011-08-22 MED ORDER — CYCLOBENZAPRINE HCL 10 MG PO TABS: 10.0000 mg | ORAL_TABLET | Freq: Three times a day (TID) | ORAL | Status: DC | PRN

## 2011-08-22 MED ORDER — ALPRAZOLAM 2 MG PO TABS: 2.0000 mg | ORAL_TABLET | Freq: Three times a day (TID) | ORAL | Status: DC | PRN

## 2011-08-22 NOTE — Telephone Encounter (Signed)
Pt req refill of alprazolam (XANAX ) 2 MG tablet, cyclobenzaprine  (FLEXERIL) 10 MG tablet, diclofenac (VOLTAREN) 75 MG EC tablet to be called in to [facility] on [address]

## 2011-08-22 NOTE — Telephone Encounter (Signed)
rx sent to pharmacy

## 2011-10-30 ENCOUNTER — Encounter: Payer: Self-pay | Admitting: Family Medicine

## 2011-10-30 ENCOUNTER — Ambulatory Visit (INDEPENDENT_AMBULATORY_CARE_PROVIDER_SITE_OTHER): Payer: Medicare Other | Admitting: Family Medicine

## 2011-10-30 DIAGNOSIS — M545 Low back pain, unspecified: Secondary | ICD-10-CM

## 2011-10-30 DIAGNOSIS — R972 Elevated prostate specific antigen [PSA]: Secondary | ICD-10-CM

## 2011-10-30 DIAGNOSIS — M479 Spondylosis, unspecified: Secondary | ICD-10-CM

## 2011-10-30 DIAGNOSIS — F528 Other sexual dysfunction not due to a substance or known physiological condition: Secondary | ICD-10-CM

## 2011-10-30 DIAGNOSIS — M1710 Unilateral primary osteoarthritis, unspecified knee: Secondary | ICD-10-CM

## 2011-10-30 DIAGNOSIS — I10 Essential (primary) hypertension: Secondary | ICD-10-CM

## 2011-10-30 MED ORDER — SILDENAFIL CITRATE 100 MG PO TABS: 100.0000 mg | ORAL_TABLET | Freq: Every day | ORAL | Status: DC | PRN

## 2011-10-30 MED ORDER — DICLOFENAC SODIUM 75 MG PO TBEC: 75.0000 mg | DELAYED_RELEASE_TABLET | Freq: Two times a day (BID) | ORAL | Status: DC

## 2011-10-30 MED ORDER — CYCLOBENZAPRINE HCL 10 MG PO TABS: ORAL_TABLET | ORAL | Status: DC

## 2011-10-30 MED ORDER — LOSARTAN POTASSIUM-HCTZ 100-25 MG PO TABS: 1.0000 | ORAL_TABLET | Freq: Every day | ORAL | Status: DC

## 2011-10-30 NOTE — Patient Instructions (Signed)
Switched to Hyzaar one daily for hypertension.  Continue current medications.  Set up an appointment in June for your annual complete physical exam

## 2011-10-30 NOTE — Progress Notes (Signed)
  Subjective:    Patient ID: Carl Trujillo, male    DOB: 1950-12-23, 61 y.o.   MRN: [id number]  HPI Carl Trujillo is a 61 year old male, nonsmoker, who comes in today to get established as a new patient.  He takes micardis for hypertension.  BP 110/74, was switched to Hyzaar because of cost issues.  He states he takes Flexeril, 10 mg twice daily, and dalteparin 75 mg twice daily for chronic low back pain.  However, he states on some occasions.  His pectus and hurt it comes and goes.  He states he was seen in 2008 and had an MRI which showed degenerative disease L4-5 and 3.  He does not recall who he saw.  He was given disability.  He does not wish to pursue the back pain.  Any further.  He also takes fiber 100 mg as needed for recti dysfunction.  He takes vitamin C, calcium, vitamin D, and over-the-counter vitamin B12.  I explained to him that none of these have any value however, he says he has sickle cell trait, and it if he doesn't take his vitamins.  It alters his immune system????????????????   Review of Systems General and cardiovascular review of systems otherwise negative    Objective:   Physical Exam  Well-developed well-nourished, male in no acute distress.  BP 110/74      Assessment & Plan:  Hypertension switched to Hyzaar 100 -- 25 daily.  Chronic low back pain.  Continue Flexeril, 10 mg b.i.d., and Voltaren, 75 mg b.i.d.  History of erectile dysfunction Viagra 100 mg p.r.n.  Sickle cell trait asymptomatic.  Carl Favre Threatt his right knee referred to Dr. Eulah Pont.  Return in June for complete physical

## 2011-11-07 ENCOUNTER — Telehealth: Payer: Self-pay | Admitting: *Deleted

## 2011-11-07 ENCOUNTER — Other Ambulatory Visit: Payer: Self-pay | Admitting: Family Medicine

## 2011-11-07 NOTE — Telephone Encounter (Signed)
Pt states bp medication was changed from micardis to hyzaar at his ov with Dr. Tawanna Cooler on 10/30/11.  Pt feels micardis is best for him since his bp has remained stable on this med for several years now.  Pt requesting a refill of micardis and alprazolam sent to [facility] on [address].

## 2011-11-24 MED ORDER — TELMISARTAN-HCTZ 80-25 MG PO TABS: 1.0000 | ORAL_TABLET | Freq: Every day | ORAL | Status: DC

## 2011-11-24 NOTE — Telephone Encounter (Signed)
Pt called to check on status of his refill for Micardis and Alprazolam to [facility] on [address]. Pls call in today. Pt is completely out of med. Pt req this on 11/07/11.

## 2011-11-24 NOTE — Telephone Encounter (Signed)
Ok to fill alprazolam?

## 2011-11-25 MED ORDER — LORAZEPAM 0.5 MG PO TABS: 0.5000 mg | ORAL_TABLET | Freq: Two times a day (BID) | ORAL | Status: AC | PRN

## 2011-11-25 NOTE — Telephone Encounter (Signed)
Returned called to patient in regards to refill of alprazolam and advised him per Dr. Tawanna Cooler instructions that he needed to schedule an appt with a psychiatrist to treat his anxiety.  However Dr. Tawanna Cooler would call in a rx for ativan .5mg  #30 1 po bid prn to [facility].

## 2011-11-25 NOTE — Telephone Encounter (Signed)
Addended by: Kern Reap B on: 11/25/2011 04:30 PM   Modules accepted: Orders

## 2011-11-25 NOTE — Telephone Encounter (Signed)
Spoke with patient.

## 2011-11-25 NOTE — Telephone Encounter (Signed)
R. please call.............. Hyzaar / myocarditis are in the same family of blood pressure lowering drugs. Have him check his blood pressure daily in the morning for 3 weeks to determine if there is a difference between the 2 medications  If he feels like he needs a tranquilizer I would recommend he see a psychiatrist explain we do not prescribe these medications  Also the current recommended dose of Flexeril is no more than 5 mg once daily. We would recommend he see an orthopedist for further evaluation of his back pain

## 2012-03-25 ENCOUNTER — Encounter: Payer: Medicare Other | Admitting: Family Medicine

## 2012-04-02 ENCOUNTER — Ambulatory Visit (INDEPENDENT_AMBULATORY_CARE_PROVIDER_SITE_OTHER): Payer: Medicare Other | Admitting: Pulmonary Disease

## 2012-04-02 ENCOUNTER — Encounter: Payer: Self-pay | Admitting: Pulmonary Disease

## 2012-04-02 VITALS — BP 132/88 | HR 78 | Temp 97.7°F | Ht 71.0 in | Wt 173.0 lb

## 2012-04-02 DIAGNOSIS — G4733 Obstructive sleep apnea (adult) (pediatric): Secondary | ICD-10-CM

## 2012-04-02 NOTE — Patient Instructions (Addendum)
Stay on cpap, keep up with supplies and filters on machine If doing well, followup with me in one year.

## 2012-04-02 NOTE — Progress Notes (Signed)
  Subjective:    Patient ID: Carl Trujillo, male    DOB: 02-03-1951, 61 y.o.   MRN: [id number]  HPI The patient comes in today for followup of his known obstructive sleep apnea.  He is wearing CPAP compliantly, and is having no issues with his mask fit or machine.  He feels that he is sleeping well, and denies any issues with daytime sleepiness.  He is keeping his weight well controlled.   Review of Systems  Constitutional: Negative for fever and unexpected weight change.  HENT: Negative for ear pain, nosebleeds, congestion, sore throat, rhinorrhea, sneezing, trouble swallowing, dental problem, postnasal drip and sinus pressure.   Eyes: Negative for redness and itching.  Respiratory: Negative for cough, chest tightness, shortness of breath and wheezing.   Cardiovascular: Negative for palpitations and leg swelling.  Gastrointestinal: Negative for nausea and vomiting.  Genitourinary: Negative for dysuria.  Musculoskeletal: Negative for joint swelling.  Skin: Negative for rash.  Neurological: Negative for headaches.  Hematological: Does not bruise/bleed easily.  Psychiatric/Behavioral: Negative for dysphoric mood. The patient is not nervous/anxious.   All other systems reviewed and are negative.       Objective:   Physical Exam Thin male in no acute distress No skin breakdown or pressure necrosis from the CPAP mask Lower extremities without edema, no cyanosis Alert and oriented, moves all 4 extremities.       Assessment & Plan:

## 2012-04-02 NOTE — Assessment & Plan Note (Signed)
The patient is doing very well from a sleep apnea standpoint.  He is wearing CPAP compliantly, and has no issues with his mask fit or pressure.  He is sleeping well, and is satisfied with his daytime alertness.

## 2012-04-06 ENCOUNTER — Telehealth: Payer: Self-pay | Admitting: Pulmonary Disease

## 2012-04-06 NOTE — Telephone Encounter (Signed)
I spoke with pt and he states he has had his cpap machine x 6 years. He is requesting a new one. Pt currently goes through [facility] but i advise dhim they will no longer accept medicare starting July 1. Pt states he is going to call medicare and will call back once he decides who he wants to go through. Will sign off messaege

## 2012-04-07 ENCOUNTER — Telehealth: Payer: Self-pay | Admitting: Pulmonary Disease

## 2012-04-07 NOTE — Telephone Encounter (Signed)
Pt states he can be reached at [phone].Carl Trujillo

## 2012-04-07 NOTE — Telephone Encounter (Signed)
KC, please advise if you are okay with putting order in for new CPAP machine as his is 62 years old; he called and was informed that [facility] does accept Medicare. Thanks.

## 2012-04-09 ENCOUNTER — Other Ambulatory Visit: Payer: Self-pay | Admitting: Pulmonary Disease

## 2012-04-09 DIAGNOSIS — G4733 Obstructive sleep apnea (adult) (pediatric): Secondary | ICD-10-CM

## 2012-04-09 NOTE — Telephone Encounter (Signed)
Order sent to [facility].  

## 2012-04-12 NOTE — Telephone Encounter (Signed)
I spoke with pt and is aware of this-he states rhonda called and told him on Friday. Nothing further was needed

## 2012-04-14 ENCOUNTER — Telehealth: Payer: Self-pay | Admitting: Pulmonary Disease

## 2012-04-14 NOTE — Telephone Encounter (Signed)
Carl Trujillo has this form in Dr. Teddy Spike CMN folder. I advsied Carl Trujillo of this. Carron Curie, CMA

## 2012-04-16 ENCOUNTER — Telehealth: Payer: Self-pay | Admitting: Pulmonary Disease

## 2012-04-16 NOTE — Telephone Encounter (Signed)
Spoke with Darel Hong with [facility] - checking on the status of CMN that was faxed on pt last week.  Alida, per phone msg from 04/13/12, you had this form for Dr. Shelle Iron to sign.  Has this been done yet?  Pls advise.  Thank you.

## 2012-04-16 NOTE — Telephone Encounter (Signed)
We have order for cpap, it is in Dr Shelle Iron folder for signature, spoke with [facility] and informed him of this, Dr Shelle Iron will be signing this weekend. Once signed we will fax, Kipp Brood will inform [place] of the same.  Kandice Hams

## 2013-08-03 ENCOUNTER — Ambulatory Visit (INDEPENDENT_AMBULATORY_CARE_PROVIDER_SITE_OTHER): Payer: Medicare Other | Admitting: Pulmonary Disease

## 2013-08-03 ENCOUNTER — Encounter: Payer: Self-pay | Admitting: Pulmonary Disease

## 2013-08-03 VITALS — BP 124/86 | HR 72 | Temp 97.7°F | Ht 70.5 in | Wt 162.4 lb

## 2013-08-03 DIAGNOSIS — G4733 Obstructive sleep apnea (adult) (pediatric): Secondary | ICD-10-CM

## 2013-08-03 NOTE — Progress Notes (Signed)
  Subjective:    Patient ID: Carl Trujillo, male    DOB: 21-Sep-1951, 62 y.o.   MRN: [id number]  HPI Patient comes in today for followup of his known obstructive sleep apnea.  He is wearing CPAP compliance, and is having no issues with his mask fit or pressure.  He sleeps very well at night, and has excellent daytime alertness.  He has lost significant weight since the last visit.   Review of Systems  Constitutional: Negative for fever and unexpected weight change.  HENT: Negative for congestion, dental problem, ear pain, nosebleeds, postnasal drip, rhinorrhea, sinus pressure, sneezing, sore throat and trouble swallowing.   Eyes: Negative for redness and itching.  Respiratory: Negative for cough, chest tightness, shortness of breath and wheezing.   Cardiovascular: Negative for palpitations and leg swelling.  Gastrointestinal: Negative for nausea and vomiting.  Genitourinary: Negative for dysuria.  Musculoskeletal: Negative for joint swelling.  Skin: Negative for rash.  Neurological: Negative for headaches.  Hematological: Does not bruise/bleed easily.  Psychiatric/Behavioral: Negative for dysphoric mood. The patient is not nervous/anxious.        Objective:   Physical Exam Well-developed male in no acute distress Nose with purulent discharge noted No skin breakdown or pressure necrosis from a CPAP mask Neck without lymphadenopathy or thyromegaly Lower extremities without edema, no cyanosis Alert and oriented, moves all 4 extremities.       Assessment & Plan:

## 2013-08-03 NOTE — Patient Instructions (Signed)
Continue with cpap, and keep up with mask changes and supplies. followup with me in one year if doing well.

## 2013-08-03 NOTE — Assessment & Plan Note (Signed)
The pt is doing very well with cpap, and is satisfied with his sleep and daytime alertness.  I have asked him to keep up with supplies and mask cushion changes.  He will f/u with me in one year if doing well.

## 2014-08-02 ENCOUNTER — Ambulatory Visit: Payer: Medicare Other | Admitting: Pulmonary Disease

## 2014-08-02 ENCOUNTER — Ambulatory Visit (INDEPENDENT_AMBULATORY_CARE_PROVIDER_SITE_OTHER): Payer: Medicare Other | Admitting: Pulmonary Disease

## 2014-08-02 ENCOUNTER — Encounter: Payer: Self-pay | Admitting: Pulmonary Disease

## 2014-08-02 ENCOUNTER — Encounter (INDEPENDENT_AMBULATORY_CARE_PROVIDER_SITE_OTHER): Payer: Self-pay

## 2014-08-02 VITALS — BP 138/76 | HR 78 | Temp 97.6°F | Ht 70.0 in | Wt 170.4 lb

## 2014-08-02 DIAGNOSIS — G4733 Obstructive sleep apnea (adult) (pediatric): Secondary | ICD-10-CM

## 2014-08-02 NOTE — Assessment & Plan Note (Signed)
The patient is wearing CPAP compliantly, and feels that he is sleeping well with the device.  He may want to change to a nasal pillows system or a nasal mask with a chin strap if he continues to have dental issues. He will let me know if this continues to be a problem

## 2014-08-02 NOTE — Progress Notes (Signed)
   Subjective:    Patient ID: Carl Trujillo, male    DOB: 12/28/1950, 63 y.o.   MRN: [id number]  HPI The patient comes in today for followup of his obstructive sleep apnea. He is wearing CPAP compliantly, and feels that it is helping his sleep and daytime alertness. He has had issues with losing teeth, and is unsure if it is due to his dentition versus the CPAP mask pressing on his gums.  He is going to have all his teeth pulled and replaced with dentures.  He does not feel the mask is fitting poorly, and would like to continue with this for now. He feels that he sleeps well with the CPAP device.   Review of Systems  Constitutional: Negative for fever and unexpected weight change.  HENT: Negative for congestion, dental problem, ear pain, nosebleeds, postnasal drip, rhinorrhea, sinus pressure, sneezing, sore throat and trouble swallowing.   Eyes: Negative for redness and itching.  Respiratory: Negative for cough, chest tightness, shortness of breath and wheezing.   Cardiovascular: Negative for palpitations and leg swelling.  Gastrointestinal: Negative for nausea and vomiting.  Genitourinary: Negative for dysuria.  Musculoskeletal: Negative for joint swelling.  Skin: Negative for rash.  Neurological: Negative for headaches.  Hematological: Does not bruise/bleed easily.  Psychiatric/Behavioral: Negative for dysphoric mood. The patient is not nervous/anxious.        Objective:   Physical Exam Well-developed male in no acute distress Nose without purulence or discharge noted No skin breakdown or pressure necrosis from the CPAP mask Neck without lymphadenopathy or thyromegaly Lower extremities without edema, no cyanosis Alert and oriented, moves all 4 extremities.       Assessment & Plan:

## 2014-08-02 NOTE — Patient Instructions (Signed)
Stay on cpap, and keep up with mask changes and supplies.  If you or your dentist thinks the full face mask is affecting jaw structure/teeth, let us know and we can discuss trying a nasal mask. followup with me again in one year.

## 2015-08-01 ENCOUNTER — Ambulatory Visit: Payer: Medicare Other | Admitting: Pulmonary Disease

## 2015-09-12 ENCOUNTER — Encounter: Payer: Self-pay | Admitting: Internal Medicine

## 2015-09-12 ENCOUNTER — Ambulatory Visit (INDEPENDENT_AMBULATORY_CARE_PROVIDER_SITE_OTHER): Payer: Medicare Other | Admitting: Internal Medicine

## 2015-09-12 VITALS — BP 112/70 | HR 87 | Ht 70.0 in | Wt 174.6 lb

## 2015-09-12 DIAGNOSIS — G4733 Obstructive sleep apnea (adult) (pediatric): Secondary | ICD-10-CM

## 2015-09-12 NOTE — Progress Notes (Signed)
   Subjective:    Patient ID: Carl Trujillo, male    DOB: 04/05/1951, 64 y.o.   MRN: [id number]  HPI  08/02/2014 Dr Shelle Ironlance The patient comes in today for followup of his obstructive sleep apnea. He is wearing CPAP compliantly, and feels that it is helping his sleep and daytime alertness. He has had issues with losing teeth, and is unsure if it is due to his dentition versus the CPAP mask pressing on his gums.  He is going to have all his teeth pulled and replaced with dentures.  He does not feel the mask is fitting poorly, and would like to continue with this for now. He feels that he sleeps well with the CPAP device.  09/12/2015-64 year old male former smoker followed for OSA, former [employer] N[employer]G 2007 AHI 33/ hr CPAP         [facility] FOLLOWS FOR:  Former [facility] pt. Wears CPAP nightly x 7-8hrs. Denies any issues with mask/pressure. DME: [facility] Very comfortable with CPAP used all night every night. Feels he sleeps well. Machine is 672 or 65 years old. Fullface mask.  Review of Systems  Constitutional: Negative for fever and unexpected weight change.  HENT: Negative for congestion, dental problem, ear pain, nosebleeds, postnasal drip, rhinorrhea, sinus pressure, sneezing, sore throat and trouble swallowing.   Eyes: Negative for redness and itching.  Respiratory: Negative for cough, chest tightness, shortness of breath and wheezing.   Cardiovascular: Negative for palpitations and leg swelling.  Gastrointestinal: Negative for nausea and vomiting.  Genitourinary: Negative for dysuria.  Musculoskeletal: Negative for joint swelling.  Skin: Negative for rash.  Neurological: Negative for headaches.  Hematological: Does not bruise/bleed easily.  Psychiatric/Behavioral: Negative for dysphoric mood. The patient is not nervous/anxious.      Objective:  OBJ- Physical Exam General- Alert, Oriented, Affect-appropriate, Distress- none acute, + trim Skin- rash-none, lesions- none, excoriation-  none Lymphadenopathy- none Head- atraumatic            Eyes- Gross vision intact, PERRLA, conjunctivae and secretions clear            Ears- Hearing, canals-normal            Nose- Clear, no-Septal dev, mucus, polyps, erosion, perforation             Throat- Mallampati II , mucosa clear , drainage- none, tonsils- atrophic, + many missing teeth Neck- flexible , trachea midline, no stridor , thyroid nl, carotid no bruit Chest - symmetrical excursion , unlabored           Heart/CV- RRR , no murmur , no gallop  , no rub, nl s1 s2                           - JVD- none , edema- none, stasis changes- none, varices- none           Lung- clear to P&A, wheeze- none, cough- none , dullness-none, rub- none           Chest wall-  Abd-  Br/ Gen/ Rectal- Not done, not indicated Extrem- cyanosis- none, clubbing, none, atrophy- none, strength- nl Neuro- grossly intact to observation         Assessment & Plan:

## 2015-09-12 NOTE — Assessment & Plan Note (Signed)
We need to clarify current pressure but he describes good compliance and control with CPAP used all night every night and he definitely feels that her using it. Plan-DME to provide download and add [employer]

## 2015-09-12 NOTE — Patient Instructions (Signed)
Order- [facility]-download CPAP for pressure compliance, Air View  continue current pressure, mask of choice, humidifier, supplies  DX OSA  Please call as needed

## 2015-10-31 ENCOUNTER — Encounter: Payer: Self-pay | Admitting: Internal Medicine

## 2015-11-23 DIAGNOSIS — R252 Cramp and spasm: Secondary | ICD-10-CM | POA: Diagnosis not present

## 2015-11-23 DIAGNOSIS — F419 Anxiety disorder, unspecified: Secondary | ICD-10-CM | POA: Diagnosis not present

## 2015-11-23 DIAGNOSIS — M1990 Unspecified osteoarthritis, unspecified site: Secondary | ICD-10-CM | POA: Diagnosis not present

## 2015-11-23 DIAGNOSIS — I10 Essential (primary) hypertension: Secondary | ICD-10-CM | POA: Diagnosis not present

## 2016-02-26 DIAGNOSIS — F419 Anxiety disorder, unspecified: Secondary | ICD-10-CM | POA: Diagnosis not present

## 2016-02-26 DIAGNOSIS — I10 Essential (primary) hypertension: Secondary | ICD-10-CM | POA: Diagnosis not present

## 2016-02-26 DIAGNOSIS — E038 Other specified hypothyroidism: Secondary | ICD-10-CM | POA: Diagnosis not present

## 2016-02-26 DIAGNOSIS — R5383 Other fatigue: Secondary | ICD-10-CM | POA: Diagnosis not present

## 2016-05-23 DIAGNOSIS — F419 Anxiety disorder, unspecified: Secondary | ICD-10-CM | POA: Diagnosis not present

## 2016-06-03 DIAGNOSIS — N400 Enlarged prostate without lower urinary tract symptoms: Secondary | ICD-10-CM | POA: Diagnosis not present

## 2016-06-03 DIAGNOSIS — N5201 Erectile dysfunction due to arterial insufficiency: Secondary | ICD-10-CM | POA: Diagnosis not present

## 2016-08-18 DIAGNOSIS — Z23 Encounter for immunization: Secondary | ICD-10-CM | POA: Diagnosis not present

## 2016-09-12 ENCOUNTER — Ambulatory Visit (INDEPENDENT_AMBULATORY_CARE_PROVIDER_SITE_OTHER): Payer: Medicare Other | Admitting: Internal Medicine

## 2016-09-12 ENCOUNTER — Encounter: Payer: Self-pay | Admitting: Internal Medicine

## 2016-09-12 VITALS — BP 126/70 | HR 88 | Ht 70.0 in | Wt 165.0 lb

## 2016-09-12 DIAGNOSIS — G4733 Obstructive sleep apnea (adult) (pediatric): Secondary | ICD-10-CM | POA: Diagnosis not present

## 2016-09-12 NOTE — Patient Instructions (Addendum)
Order- DME [facility] continue CPAP 12,  Mask of choice, humidifier, supplies, AirView      Dx OSA                     Please send download for pressure compliance      Please call as needed

## 2016-09-12 NOTE — Progress Notes (Signed)
Subjective:    Patient ID: Carl Trujillo, male    DOB: 02/21/1951, 65 y.o.   MRN: [id number]  HPI  Male former smoker followed for OSA (former [employer]), complicated by HBP, arthritis   HPI  08/02/2014 Dr Shelle Ironlance The patient comes in today for followup of his obstructive sleep apnea. He is wearing CPAP compliantly, and feels that it is helping his sleep and daytime alertness. He has had issues with losing teeth, and is unsure if it is due to his dentition versus the CPAP mask pressing on his gums.  He is going to have all his teeth pulled and replaced with dentures.  He does not feel the mask is fitting poorly, and would like to continue with this for now. He feels that he sleeps well with the CPAP device.  09/12/2015-65 year old male former smoker followed for OSA, former [employer] N[employer] 2007 AHI 33/ hr CPAP         [facility] FOLLOWS FOR:  Former [facility] pt. Wears CPAP nightly x 7-8hrs. Denies any issues with mask/pressure. DME: [facility] Very comfortable with CPAP used all night every night. Feels he sleeps well. Machine is 552 or 66 years old. Fullface mask.  09/12/2016-65 year old male former smoker followed for OSA, former [employer] CPAP 12/[facility] FOLLOWS FOR: pt doing well on cpap, denies any complaints today.  requesting new supplies order.  He feels he is sleeping well and states he is using CPAP every night. No download available at this visit. Previous downloads have not supported claim of every night use.  Review of Systems  Constitutional: Negative for fever and unexpected weight change.  HENT: Negative for congestion, dental problem, ear pain, nosebleeds, postnasal drip, rhinorrhea, sinus pressure, sneezing, sore throat and trouble swallowing.   Eyes: Negative for redness and itching.  Respiratory: Negative for cough, chest tightness, shortness of breath and wheezing.   Cardiovascular: Negative for palpitations and leg swelling.    Gastrointestinal: Negative for nausea and vomiting.  Genitourinary: Negative for dysuria.  Musculoskeletal: Negative for joint swelling.  Skin: Negative for rash.  Neurological: Negative for headaches.  Hematological: Does not bruise/bleed easily.  Psychiatric/Behavioral: Negative for dysphoric mood. The patient is not nervous/anxious.      Objective:  OBJ- Physical Exam General- Alert, Oriented, Affect-appropriate, Distress- none acute, + Not obese Skin- rash-none, lesions- none, excoriation- none Lymphadenopathy- none Head- atraumatic            Eyes- Gross vision intact, PERRLA, conjunctivae and secretions clear            Ears- Hearing, canals-normal            Nose- Clear, no-Septal dev, mucus, polyps, erosion, perforation             Throat- Mallampati II , mucosa clear , drainage- none, tonsils- atrophic, + many missing teeth Neck- flexible , trachea midline, no stridor , thyroid nl, carotid no bruit Chest - symmetrical excursion , unlabored           Heart/CV- RRR , no murmur , no gallop  , no rub, nl s1 s2                           - JVD- none , edema- none, stasis changes- none, varices- none           Lung- clear to P&A, wheeze- none, cough- none , dullness-none, rub- none  Chest wall-  Abd-  Br/ Gen/ Rectal- Not done, not indicated Extrem- cyanosis- none, clubbing, none, atrophy- none, strength- nl Neuro- grossly intact to observation    Assessment & Plan:

## 2016-09-12 NOTE — Assessment & Plan Note (Signed)
We again emphasized compliance and comfort goals and medical indications for treating OSA. Plan-request download from his DME company. Continue present settings.

## 2016-09-16 ENCOUNTER — Other Ambulatory Visit (INDEPENDENT_AMBULATORY_CARE_PROVIDER_SITE_OTHER): Payer: Medicare Other

## 2016-09-16 ENCOUNTER — Encounter: Payer: Self-pay | Admitting: Family

## 2016-09-16 ENCOUNTER — Ambulatory Visit (INDEPENDENT_AMBULATORY_CARE_PROVIDER_SITE_OTHER): Payer: Medicare Other | Admitting: Family

## 2016-09-16 DIAGNOSIS — F528 Other sexual dysfunction not due to a substance or known physiological condition: Secondary | ICD-10-CM

## 2016-09-16 DIAGNOSIS — F411 Generalized anxiety disorder: Secondary | ICD-10-CM

## 2016-09-16 DIAGNOSIS — G8929 Other chronic pain: Secondary | ICD-10-CM

## 2016-09-16 DIAGNOSIS — M5442 Lumbago with sciatica, left side: Secondary | ICD-10-CM

## 2016-09-16 DIAGNOSIS — I10 Essential (primary) hypertension: Secondary | ICD-10-CM

## 2016-09-16 LAB — COMPREHENSIVE METABOLIC PANEL
ALT: 23 U/L (ref 0–53)
AST: 21 U/L (ref 0–37)
Albumin: 4.2 g/dL (ref 3.5–5.2)
Alkaline Phosphatase: 69 U/L (ref 39–117)
BUN: 11 mg/dL (ref 6–23)
CO2: 31 meq/L (ref 19–32)
Calcium: 9.4 mg/dL (ref 8.4–10.5)
Chloride: 102 mEq/L (ref 96–112)
Creatinine, Ser: 1.01 mg/dL (ref 0.40–1.50)
GFR: 95.05 mL/min (ref >60.00)
GLUCOSE: 96 mg/dL (ref 70–99)
POTASSIUM: 3.8 meq/L (ref 3.5–5.1)
Sodium: 139 mEq/L (ref 135–145)
Total Bilirubin: 0.7 mg/dL (ref 0.2–1.2)
Total Protein: 6.8 g/dL (ref 6.0–8.3)

## 2016-09-16 LAB — CBC
HCT: 40.1 % (ref 39.0–52.0)
HEMOGLOBIN: 13.8 g/dL (ref 13.0–17.0)
MCHC: 34.4 g/dL (ref 30.0–36.0)
MCV: 91.7 fl (ref 78.0–100.0)
PLATELETS: 287 10*3/uL (ref 150.0–400.0)
RBC: 4.38 Mil/uL (ref 4.22–5.81)
RDW: 12.9 % (ref 11.5–15.5)
WBC: 4.4 10*3/uL (ref 4.0–10.5)

## 2016-09-16 MED ORDER — CYCLOBENZAPRINE HCL 10 MG PO TABS
10.0000 mg | ORAL_TABLET | Freq: Three times a day (TID) | ORAL | 0 refills | Status: DC | PRN
Start: 2016-09-16 — End: 2016-10-17

## 2016-09-16 MED ORDER — ALPRAZOLAM 2 MG PO TABS: 2.0000 mg | ORAL_TABLET | Freq: Three times a day (TID) | ORAL | 0 refills | Status: DC | PRN

## 2016-09-16 MED ORDER — LOSARTAN POTASSIUM-HCTZ 100-25 MG PO TABS: 1.0000 | ORAL_TABLET | Freq: Every day | ORAL | 0 refills | Status: DC

## 2016-09-16 NOTE — Assessment & Plan Note (Signed)
Blood pressure adequately controlled with current regimen and no adverse side effects. Continue current dosage of losartan-hydrochlorothiazide. Denies worse headache of life with no new symptoms of end organ damage noted on physical exam.Continue to monitor blood pressure at home and follow a low-sodium diet.

## 2016-09-16 NOTE — Assessment & Plan Note (Signed)
Stable with current medication regimen and no adverse side effects or priapism. Continue current dosage of sildenafil.

## 2016-09-16 NOTE — Assessment & Plan Note (Signed)
Chronic low back pain appears stable with current dosage of Flexeril and diclofenac.continue current medication regimen with home exercise therapy provided.

## 2016-09-16 NOTE — Progress Notes (Signed)
Subjective:    Patient ID: Carl Trujillo, male    DOB: [birth year], 65 y.o.   MRN: [id number]  Chief Complaint  Patient presents with  . Establish Care    no concerns     HPI:  Carl Trujillo is a 65 y.o. male who  has a past medical history of Anxiety; Arthritis of right knee; Arthritis of spine [facility]); ED (erectile dysfunction); and Hypertension. and presents today for an office visit to establish care.  1.) Hypertension - currently maintained on losartan-hydrochlorothiazide. Reports taking the medication as prescribed and denies adverse side effects or hypotensive readings. Denies worst headache of life or new symptoms of end organ damage. Blood pressures at home  BP Readings from Last 3 Encounters:  09/16/16 138/80  09/12/16 126/70  09/12/15 112/70    2.) Erectile dysfunction - Currently maintained on sildenafil. Reports taking the medication as prescribed and denies adverse side effects or priapism. Symptoms are generally well controlled with the current medication regimen.    3.) Anxiety - Currently maintained on alprazolam. Reports taking the medication as prescribed and denies adverse side effects. Symptoms of anxiety are generally well controlled with the medication.   4.) Chronic Back pain - L3-L5 disc pathology and currently maitnained on diclofenac and cyclobenzaprine. Reports taking the medication as prescribed and denies adverse side effects. States it is not recommended for him to do regular stretching /exercise.    No Known Allergies    Outpatient Medications Prior to Visit  Medication Sig Dispense Refill  . Cascara Sagrada 450 MG CAPS Take by mouth.    . diclofenac (VOLTAREN) 75 MG EC tablet Take 1 tablet (75 mg total) by mouth 2 (two) times daily. Alternates with Tylenol arthritis 200 tablet 2  . Omega-3 Fatty Acids (OMEGA 3 PO) Take by mouth. Omega XL - 2 capsules daily    . pyridoxine (B-6) 100 MG tablet Take 100 mg by mouth daily.    . sildenafil (VIAGRA) 100  MG tablet Take 1 tablet (100 mg total) by mouth daily as needed. Use as instructed 5 tablet 11  . Specialty Vitamins Products (CENTRUM PERFORMANCE) TABS Take by mouth.      . vitamin B-12 (CYANOCOBALAMIN) 1000 MCG tablet Take 1,000 mcg by mouth daily.      [name] alprazolam (XANAX) 2 MG tablet 1 by mouth three times daily as needed    . cyclobenzaprine (FLEXERIL) 10 MG tablet One p.o. B.i.d. 200 tablet 2  . losartan-hydrochlorothiazide (HYZAAR) 100-25 MG per tablet Take 1 tablet by mouth daily.     No facility-administered medications prior to visit.      Past Medical History:  Diagnosis Date  . Anxiety   . Arthritis of right knee   . Arthritis of spine ([place])   . ED (erectile dysfunction)   . Hypertension       No past surgical history on file.    Family History  Problem Relation Age of Onset  . Cancer Other   . Coronary artery disease Other       Social History   Social History  . Marital status: Single    Spouse name: N/A  . Number of children: N/A  . Years of education: N/A   Occupational History  . retired    Social History Main Topics  . Smoking status: Former Smoker    Years: 23.00    Types: Cigars    Quit date: 10/13/1997  . Smokeless tobacco: Never Used     Comment: current  smoker of cigars in the pm - stopped cigarettes 52yr ago  . Alcohol use No  . Drug use: No  . Sexual activity: Not on file   Other Topics Concern  . Not on file   Social History Narrative  . No narrative on file     Review of Systems  Constitutional: Negative for chills and fever.  Eyes:       Negative for changes in vision  Respiratory: Negative for cough, chest tightness and wheezing.   Cardiovascular: Negative for chest pain, palpitations and leg swelling.  Neurological: Negative for dizziness, weakness and light-headedness.       Objective:    BP 138/80 (BP Location: Left Arm, Patient Position: Sitting, Cuff Size: Normal)   Pulse (!) 50   Temp 98.7 F (37.1 C)  (Oral)   Resp 16   Ht _0  (1.778 m)   Wt 165 lb (74.8 kg)   SpO2 98%   BMI 23.68 kg/m  Nursing note and vital signs reviewed.  Physical Exam  Constitutional: He is oriented to person, place, and time. He appears well-developed and well-nourished. No distress.  Cardiovascular: Normal rate, regular rhythm, normal heart sounds and intact distal pulses.   Pulmonary/Chest: Effort normal and breath sounds normal.  Musculoskeletal:  Low back - no obvious deformity, discoloration, or edema. Mild tenderness and muscle spasm of left lumbar paraspinal musculature. Range of motion appears within normal ranges with no significant discomfort. Distal pulses and sensation are intact and appropriate. Negative straight leg raise; negative Faber's.  Neurological: He is alert and oriented to person, place, and time.  Skin: Skin is warm and dry.  Psychiatric: He has a normal mood and affect. His behavior is normal. Judgment and thought content normal.        Assessment & Plan:   Problem List Items Addressed This Visit      Cardiovascular and Mediastinum   Essential hypertension    Blood pressure adequately controlled with current regimen and no adverse side effects. Continue current dosage of losartan-hydrochlorothiazide. Denies worse headache of life with no new symptoms of end organ damage noted on physical exam.Continue to monitor blood pressure at home and follow a low-sodium diet.      Relevant Medications   losartan-hydrochlorothiazide (HYZAAR) 100-25 MG tablet   Other Relevant Orders   Comp Met (CMET) (Completed)   CBC (Completed)     Other   Generalized anxiety disorder    Generalized anxiety disorder currently maintained on alprazolam and appears stable Refer to psychology for cognitive behavioral therapy/counseling. [place] with no irregularities.      Relevant Medications   alprazolam (XANAX) 2 MG  tablet   Other Relevant Orders   Ambulatory referral to Psychology   ERECTILE DYSFUNCTION, MILD    Stable with current medication regimen and no adverse side effects or priapism. Continue current dosage of sildenafil.      LOW BACK PAIN, CHRONIC    Chronic low back pain appears stable with current dosage of Flexeril and diclofenac.continue current medication regimen with home exercise therapy provided.      Relevant Medications   cyclobenzaprine (FLEXERIL) 10 MG tablet       I have changed Mr. Kutsch's losartan-hydrochlorothiazide, alprazolam, and cyclobenzaprine. I am also having him maintain his CENTRUM PERFORMANCE, vitamin B-12, pyridoxine, Cascara Sagrada, diclofenac, sildenafil, and Omega-3 Fatty Acids (OMEGA 3 PO).   Meds ordered this encounter  Medications  . losartan-hydrochlorothiazide (HYZAAR) 100-25 MG  tablet    Sig: Take 1 tablet by mouth daily.    Dispense:  90 tablet    Refill:  0    Order Specific Question:   Supervising Provider    Answer:   Pricilla Holm A [[id number]]  . alprazolam (XANAX) 2 MG tablet    Sig: Take 1 tablet (2 mg total) by mouth 3 (three) times daily as needed for sleep.    Dispense:  90 tablet    Refill:  0    Order Specific Question:   Supervising Provider    Answer:   Pricilla Holm A [[id number]]  . cyclobenzaprine (FLEXERIL) 10 MG tablet    Sig: Take 1 tablet (10 mg total) by mouth 3 (three) times daily as needed for muscle spasms.    Dispense:  90 tablet    Refill:  0    Order Specific Question:   Supervising Provider    Answer:   Pricilla Holm A [[id number]]     Follow-up: Return in about 2 months (around 11/17/2016), or if symptoms worsen or fail to improve.  Mauricio Po, FNP

## 2016-09-16 NOTE — Assessment & Plan Note (Signed)
Generalized anxiety disorder currently maintained on alprazolam and appears stable Refer to psychology for cognitive behavioral therapy/counseling. Continue current dosage of alprazolam.[place] reviewed with no irregularities.

## 2016-09-16 NOTE — Patient Instructions (Signed)
Thank you for choosing [facility].  SUMMARY AND INSTRUCTIONS:  They will call to schedule your appointment with psychology.  Please follow up for annual wellness at your convenience.   Medication:  Please continue to take your medications as prescribed.    Your prescription(s) have been submitted to your pharmacy or been printed and provided for you. Please take as directed and contact our office if you believe you are having problem(s) with the medication(s) or have any questions.  Labs:  Please stop by the lab on the lower level of the building for your blood work. Your results will be released to MyChart (or called to you) after review, usually within 72 hours after test completion. If any changes need to be made, you will be notified at that same time.  1.) The lab is open from 7:30am to 5:30 pm Monday-Friday 2.) No appointment is necessary 3.) Fasting (if needed) is 6-8 hours after food and drink; black coffee and water are okay    Follow up:  If your symptoms worsen or fail to improve, please contact our office for further instruction, or in case of emergency go directly to the emergency room at the closest medical facility.

## 2016-09-19 ENCOUNTER — Telehealth: Payer: Self-pay | Admitting: Family

## 2016-09-19 NOTE — Telephone Encounter (Signed)
Please call patient with lab results. He refused to allow me to advise on notes.

## 2016-09-22 NOTE — Telephone Encounter (Signed)
Tried calling pt back. Phone was off and VM was full.

## 2016-10-17 ENCOUNTER — Other Ambulatory Visit: Payer: Self-pay | Admitting: Family

## 2016-10-17 DIAGNOSIS — F411 Generalized anxiety disorder: Secondary | ICD-10-CM

## 2016-10-17 DIAGNOSIS — G8929 Other chronic pain: Secondary | ICD-10-CM

## 2016-10-17 DIAGNOSIS — M5442 Lumbago with sciatica, left side: Secondary | ICD-10-CM

## 2016-10-20 NOTE — Telephone Encounter (Signed)
Faxed (xanax) [facility]...Raechel Chute/lmb

## 2016-10-21 ENCOUNTER — Other Ambulatory Visit: Payer: Self-pay

## 2016-10-21 DIAGNOSIS — M1990 Unspecified osteoarthritis, unspecified site: Secondary | ICD-10-CM

## 2016-10-21 MED ORDER — DICLOFENAC SODIUM 75 MG PO TBEC: 75.0000 mg | DELAYED_RELEASE_TABLET | Freq: Two times a day (BID) | ORAL | 0 refills | Status: DC

## 2016-11-11 ENCOUNTER — Ambulatory Visit (INDEPENDENT_AMBULATORY_CARE_PROVIDER_SITE_OTHER): Payer: Medicare Other | Admitting: Psychiatry

## 2016-11-11 DIAGNOSIS — F22 Delusional disorders: Secondary | ICD-10-CM

## 2016-11-17 ENCOUNTER — Other Ambulatory Visit: Payer: Self-pay | Admitting: Family

## 2016-11-17 DIAGNOSIS — G8929 Other chronic pain: Secondary | ICD-10-CM

## 2016-11-17 DIAGNOSIS — I10 Essential (primary) hypertension: Secondary | ICD-10-CM

## 2016-11-17 DIAGNOSIS — M5442 Lumbago with sciatica, left side: Principal | ICD-10-CM

## 2016-11-17 DIAGNOSIS — F411 Generalized anxiety disorder: Secondary | ICD-10-CM

## 2016-11-18 NOTE — Telephone Encounter (Signed)
Routing to greg, please advise, thanks 

## 2016-11-18 NOTE — Telephone Encounter (Signed)
Please have patient make follow up appointment for refills of alprazolam.

## 2016-11-24 ENCOUNTER — Encounter: Payer: Self-pay | Admitting: Family

## 2016-11-24 ENCOUNTER — Other Ambulatory Visit: Payer: Medicare Other

## 2016-11-24 ENCOUNTER — Ambulatory Visit (INDEPENDENT_AMBULATORY_CARE_PROVIDER_SITE_OTHER): Payer: Medicare Other | Admitting: Family

## 2016-11-24 VITALS — BP 136/82 | HR 63 | Temp 98.6°F | Resp 16 | Ht 70.0 in | Wt 168.0 lb

## 2016-11-24 DIAGNOSIS — Z7289 Other problems related to lifestyle: Secondary | ICD-10-CM

## 2016-11-24 DIAGNOSIS — F411 Generalized anxiety disorder: Secondary | ICD-10-CM | POA: Diagnosis not present

## 2016-11-24 MED ORDER — ALPRAZOLAM 2 MG PO TABS: 2.0000 mg | ORAL_TABLET | Freq: Three times a day (TID) | ORAL | 0 refills | Status: DC | PRN

## 2016-11-24 NOTE — Assessment & Plan Note (Signed)
Generalized anxiety currently maintained on alprazolam with no adverse side effects. There is concern for the amount of medication which he indicates he does not take every day. Psychology recommends referral to psychiatry for assistance in medication management with referral placed. Consider reduction of alprazolam at next refill. Continue to monitor.

## 2016-11-24 NOTE — Patient Instructions (Signed)
Thank you for choosing [facility].  SUMMARY AND INSTRUCTIONS:  Continue to take your medications as prescribed.   They will call to schedule your appointment with psychiatry.  Medication:  Your prescription(s) have been submitted to your pharmacy or been printed and provided for you. Please take as directed and contact our office if you believe you are having problem(s) with the medication(s) or have any questions.  Labs:  Please stop by the lab on the lower level of the building for your blood work. Your results will be released to MyChart (or called to you) after review, usually within 72 hours after test completion. If any changes need to be made, you will be notified at that same time.  1.) The lab is open from 7:30am to 5:30 pm Monday-Friday 2.) No appointment is necessary 3.) Fasting (if needed) is 6-8 hours after food and drink; black coffee and water are okay   Imaging / Radiology:  Please stop by radiology on the basement level of the building for your x-rays. Your results will be released to MyChart (or called to you) after review, usually within 72 hours after test completion. If any treatments or changes are necessary, you will be notified at that same time.  Referrals:  Referrals have been made during this visit. You should expect to hear back from our schedulers in about 7-10 days in regards to establishing an appointment with the specialists we discussed.   Follow up:  If your symptoms worsen or fail to improve, please contact our office for further instruction, or in case of emergency go directly to the emergency room at the closest medical facility.

## 2016-11-24 NOTE — Progress Notes (Signed)
Subjective:    Patient ID: Carl Trujillo, male    DOB: [birth year], 66 y.o.   MRN: [id number]  Chief Complaint  Patient presents with  . Follow-up    HPI:  Carl Trujillo is a 66 y.o. male who  has a past medical history of Anxiety; Arthritis of right knee; Arthritis of spine [facility]); ED (erectile dysfunction); and Hypertension. and presents today for a follow up office visit.   1.) Generalized anxiety disorder - Currently maintained on alprazolam taken 3 times per day. Reports taking the medication as prescribed and denies adverse side effects. Recently evaluated by psychology with concerns for the increased dosage of alprazolam with the recommendation for follow up with psychiatry. Reports that he has been taking 1-2 per day on average with occasional third dosing.   2.) Health maintenance -   Health Maintenance  Topic Date Due  . Hepatitis C Screening  [birth year]  . COLONOSCOPY  09/19/2000  . ZOSTAVAX  09/19/2010  . PNA vac Low Risk Adult (2 of 2 - PCV13) 08/22/2017  . TETANUS/TDAP  07/13/2018  . INFLUENZA VACCINE  Completed    No Known Allergies    Outpatient Medications Prior to Visit  Medication Sig Dispense Refill  . Cascara Sagrada 450 MG CAPS Take by mouth.    . cyclobenzaprine (FLEXERIL) 10 MG tablet TAKE ONE TABLET BY MOUTH THREE TIMES DAILY AS NEEDED FOR MUSCLE SPASM 90 tablet 0  . diclofenac (VOLTAREN) 75 MG EC tablet Take 1 tablet (75 mg total) by mouth 2 (two) times daily. Alternates with Tylenol arthritis 180 tablet 0  . losartan-hydrochlorothiazide (HYZAAR) 100-25 MG tablet TAKE ONE TABLET BY MOUTH ONCE DAILY 90 tablet 0  . Omega-3 Fatty Acids (OMEGA 3 PO) Take by mouth. Omega XL - 2 capsules daily    . pyridoxine (B-6) 100 MG tablet Take 100 mg by mouth daily.    . sildenafil (VIAGRA) 100 MG tablet Take 1 tablet (100 mg total) by mouth daily as needed. Use as instructed 5 tablet 11  . Specialty Vitamins Products (CENTRUM PERFORMANCE) TABS Take by mouth.      .  vitamin B-12 (CYANOCOBALAMIN) 1000 MCG tablet Take 1,000 mcg by mouth daily.      [name] alprazolam (XANAX) 2 MG tablet TAKE ONE TABLET BY MOUTH THREE TIMES DAILY AS NEEDED 90 tablet 0   No facility-administered medications prior to visit.     Review of Systems  Constitutional: Negative for chills and fever.  Respiratory: Negative for chest tightness and shortness of breath.   Gastrointestinal: Negative for abdominal pain, blood in stool, constipation, diarrhea, nausea and rectal pain.  Psychiatric/Behavioral: Negative for behavioral problems, decreased concentration, dysphoric mood and sleep disturbance. The patient is not nervous/anxious.       Objective:    BP 136/82 (BP Location: Left Arm, Patient Position: Sitting, Cuff Size: Normal)   Pulse 63   Temp 98.6 F (37 C) (Oral)   Resp 16   Ht 5\' 10"  (1.778 m)   Wt 168 lb (76.2 kg)   SpO2 95%   BMI 24.11 kg/m  Nursing note and vital signs reviewed.  Physical Exam  Constitutional: He is oriented to person, place, and time. He appears well-developed and well-nourished. No distress.  Cardiovascular: Normal rate, regular rhythm, normal heart sounds and intact distal pulses.   Pulmonary/Chest: Effort normal and breath sounds normal.  Neurological: He is alert and oriented to person, place, and time.  Skin: Skin is warm and dry.  Psychiatric: He has  a normal mood and affect. His behavior is normal. Judgment and thought content normal.       Assessment & Plan:   Problem List Items Addressed This Visit      Other   Generalized anxiety disorder - Primary    Generalized anxiety currently maintained on alprazolam with no adverse side effects. There is concern for the amount of medication which he indicates he does not take every day. Psychology recommends referral to psychiatry for assistance in medication management with referral placed. Consider reduction of alprazolam at next refill. Continue to monitor.       Relevant Medications    alprazolam (XANAX) 2 MG tablet   Other Relevant Orders   Ambulatory referral to Psychiatry    Other Visit Diagnoses    Other problems related to lifestyle       Relevant Orders   Hepatitis C antibody       I have changed Carl Trujillo's alprazolam. I am also having him maintain his CENTRUM PERFORMANCE, vitamin B-12, pyridoxine, Cascara Sagrada, sildenafil, Omega-3 Fatty Acids (OMEGA 3 PO), diclofenac, cyclobenzaprine, and losartan-hydrochlorothiazide.   Meds ordered this encounter  Medications  . alprazolam (XANAX) 2 MG tablet    Sig: Take 1 tablet (2 mg total) by mouth 3 (three) times daily as needed.    Dispense:  90 tablet    Refill:  0    Order Specific Question:   Supervising Provider    Answer:   Hillard DankerRAWFORD, ELIZABETH A [[id number]]     Follow-up: Return in about 1 month (around 12/22/2016), or if symptoms worsen or fail to improve.  Carl Trujillo, Gregory, FNP

## 2016-11-25 LAB — HEPATITIS C ANTIBODY: HCV Ab: NEGATIVE

## 2016-12-09 ENCOUNTER — Encounter: Payer: Self-pay | Admitting: Family

## 2016-12-22 ENCOUNTER — Other Ambulatory Visit: Payer: Self-pay | Admitting: Family

## 2016-12-22 DIAGNOSIS — F411 Generalized anxiety disorder: Secondary | ICD-10-CM

## 2016-12-23 NOTE — Telephone Encounter (Signed)
Routing to greg, please advise, thanks 

## 2016-12-24 ENCOUNTER — Other Ambulatory Visit: Payer: Self-pay

## 2016-12-24 DIAGNOSIS — F411 Generalized anxiety disorder: Secondary | ICD-10-CM

## 2016-12-29 MED ORDER — ALPRAZOLAM 1 MG PO TABS: 1.0000 mg | ORAL_TABLET | Freq: Three times a day (TID) | ORAL | 0 refills | Status: DC | PRN

## 2017-01-05 DIAGNOSIS — F419 Anxiety disorder, unspecified: Secondary | ICD-10-CM | POA: Diagnosis not present

## 2017-03-30 DIAGNOSIS — F419 Anxiety disorder, unspecified: Secondary | ICD-10-CM | POA: Diagnosis not present

## 2017-05-11 ENCOUNTER — Telehealth: Payer: Self-pay | Admitting: Family

## 2017-05-11 NOTE — Telephone Encounter (Signed)
Marchelle Folksmanda from [facility] called checking on the status of an orthotics order that was faxed over on 05/08/2017. She wanted to make sure that we received it.

## 2017-05-11 NOTE — Telephone Encounter (Signed)
Returned call to [place]. She was not at her phone. LVM for her to call back in regards. Did also call pt to see if this was something he wanted. He does want the rx written for back brace and knee brace. Will you be willing to write an rx for both to fax over to [facility] for pt.

## 2017-05-15 NOTE — Telephone Encounter (Signed)
Received paperwork and will be completed.

## 2017-06-22 DIAGNOSIS — F419 Anxiety disorder, unspecified: Secondary | ICD-10-CM | POA: Diagnosis not present

## 2017-07-03 ENCOUNTER — Ambulatory Visit (INDEPENDENT_AMBULATORY_CARE_PROVIDER_SITE_OTHER): Payer: Medicare Other | Admitting: Family

## 2017-07-03 ENCOUNTER — Encounter: Payer: Self-pay | Admitting: Family

## 2017-07-03 VITALS — BP 138/80 | HR 66 | Temp 98.1°F | Resp 16 | Ht 70.0 in | Wt 160.0 lb

## 2017-07-03 DIAGNOSIS — M5442 Lumbago with sciatica, left side: Secondary | ICD-10-CM | POA: Diagnosis not present

## 2017-07-03 DIAGNOSIS — I10 Essential (primary) hypertension: Secondary | ICD-10-CM | POA: Diagnosis not present

## 2017-07-03 DIAGNOSIS — G8929 Other chronic pain: Secondary | ICD-10-CM | POA: Diagnosis not present

## 2017-07-03 MED ORDER — DICLOFENAC SODIUM 75 MG PO TBEC: 75.0000 mg | DELAYED_RELEASE_TABLET | Freq: Two times a day (BID) | ORAL | 0 refills | Status: DC

## 2017-07-03 MED ORDER — CYCLOBENZAPRINE HCL 10 MG PO TABS: 10.0000 mg | ORAL_TABLET | Freq: Three times a day (TID) | ORAL | 1 refills | Status: DC | PRN

## 2017-07-03 MED ORDER — LOSARTAN POTASSIUM-HCTZ 100-25 MG PO TABS: 1.0000 | ORAL_TABLET | Freq: Every day | ORAL | 1 refills | Status: DC

## 2017-07-03 NOTE — Patient Instructions (Signed)
Thank you for choosing [facility].  SUMMARY AND INSTRUCTIONS:  Please continue to take your medications as prescribed.  We will check your kidney function, liver function, and electrolytes.   Medication:  Your prescription(s) have been submitted to your pharmacy or been printed and provided for you. Please take as directed and contact our office if you believe you are having problem(s) with the medication(s) or have any questions.  Labs:  Please stop by the lab on the lower level of the building for your blood work. Your results will be released to MyChart (or called to you) after review, usually within 72 hours after test completion. If any changes need to be made, you will be notified at that same time.  1.) The lab is open from 7:30am to 5:30 pm Monday-Friday 2.) No appointment is necessary 3.) Fasting (if needed) is 6-8 hours after food and drink; black coffee and water are okay   Follow up:  If your symptoms worsen or fail to improve, please contact our office for further instruction, or in case of emergency go directly to the emergency room at the closest medical facility.

## 2017-07-03 NOTE — Progress Notes (Signed)
Subjective:    Patient ID: Carl Trujillo, male    DOB: [birth year], 66 y.o.   MRN: [id number]  Chief Complaint  Patient presents with  . Follow-up    wants to see about getting 90 supply of meds until he finds another provider    HPI:  Carl Trujillo is a 66 y.o. male who  has a past medical history of Anxiety; Arthritis of right knee; Arthritis of spine [facility]); ED (erectile dysfunction); and Hypertension. and presents today for a follow up office visit.   1.) Hypertension - Currently maintained on losartan-hydrochlorothiazide and reports taking the medications as prescribed and denies adverse side effects or hypotensive readings. Blood pressures at home have been well controlled. Denies changes in vision, worst headache of life or new symptoms of end organ damage. Working on following a low sodium diet and eating a limited red meat intake.   BP Readings from Last 3 Encounters:  07/03/17 138/80  11/24/16 136/82  09/16/16 138/80    2.) Chronic low back pain - Currently maintained on diclofenac and cyclobenzaprine. Reports taking the medication as prescribed and denies adverse side effects. Symptoms are generally well controlled with the current medication regimen and able to complete his activities of daily living.   No Known Allergies    Outpatient Medications Prior to Visit  Medication Sig Dispense Refill  . alprazolam (XANAX) 1 MG tablet Take 1 tablet (1 mg total) by mouth 3 (three) times daily as needed. 90 tablet 0  . Cascara Sagrada 450 MG CAPS Take by mouth.    . Omega-3 Fatty Acids (OMEGA 3 PO) Take by mouth. Omega XL - 2 capsules daily    . pyridoxine (B-6) 100 MG tablet Take 100 mg by mouth daily.    . sildenafil (VIAGRA) 100 MG tablet Take 1 tablet (100 mg total) by mouth daily as needed. Use as instructed 5 tablet 11  . Specialty Vitamins Products (CENTRUM PERFORMANCE) TABS Take by mouth.      . vitamin B-12 (CYANOCOBALAMIN) 1000 MCG tablet Take 1,000 mcg by mouth daily.       . cyclobenzaprine (FLEXERIL) 10 MG tablet TAKE ONE TABLET BY MOUTH THREE TIMES DAILY AS NEEDED FOR MUSCLE SPASM 90 tablet 0  . diclofenac (VOLTAREN) 75 MG EC tablet Take 1 tablet (75 mg total) by mouth 2 (two) times daily. Alternates with Tylenol arthritis 180 tablet 0  . losartan-hydrochlorothiazide (HYZAAR) 100-25 MG tablet TAKE ONE TABLET BY MOUTH ONCE DAILY 90 tablet 0   No facility-administered medications prior to visit.      Past Medical History:  Diagnosis Date  . Anxiety   . Arthritis of right knee   . Arthritis of spine ([place])   . ED (erectile dysfunction)   . Hypertension     Review of Systems  Constitutional: Negative for chills and fever.  Eyes:       Negative for changes in vision  Respiratory: Negative for cough, chest tightness and wheezing.   Cardiovascular: Negative for chest pain, palpitations and leg swelling.  Neurological: Negative for dizziness, weakness and light-headedness.      Objective:    BP 138/80 (BP Location: Left Arm, Patient Position: Sitting, Cuff Size: Normal)   Pulse 66   Temp 98.1 F (36.7 C) (Oral)   Resp 16   Ht _0  (1.778 m)   Wt 160 lb (72.6 kg)   SpO2 98%   BMI 22.96 kg/m  Nursing note and vital signs reviewed.  Physical Exam  Constitutional:  He is oriented to person, place, and time. He appears well-developed and well-nourished. No distress.  Cardiovascular: Normal rate, regular rhythm, normal heart sounds and intact distal pulses.  Exam reveals no gallop and no friction rub.   No murmur heard. Pulmonary/Chest: Effort normal and breath sounds normal. No respiratory distress. He has no wheezes. He has no rales. He exhibits no tenderness.  Neurological: He is alert and oriented to person, place, and time.  Skin: Skin is warm and dry.  Psychiatric: He has a normal mood and affect. His behavior is normal. Judgment and thought content normal.       Assessment & Plan:   Problem List Items Addressed This Visit       Cardiovascular and Mediastinum   Essential hypertension - Primary    Blood pressure well controlled with current medication regimen and no adverse side effects or hypotensive readings. Denies worse headache of life. Continue current dosage of losartan-hydrochlorothiazide. Continue to monitor blood pressure at home and follow low-sodium diet. Follow-up in 6 months or sooner if needed.      Relevant Medications   losartan-hydrochlorothiazide (HYZAAR) 100-25 MG tablet   Other Relevant Orders   Comp Met (CMET)     Other   LOW BACK PAIN, CHRONIC    Stable with current medication regimen and no adverse side effects. Able to function and complete activities of daily living with current medication regimen. Continue current dosage of cyclobenzaprine and diclofenac. Obtain complete metabolic profile for therapeutic drug monitoring. Continue nonpharmacological treatment including ice/heat and home exercise therapy.      Relevant Medications   cyclobenzaprine (FLEXERIL) 10 MG tablet   diclofenac (VOLTAREN) 75 MG EC tablet       I have changed Mr. Hruska's losartan-hydrochlorothiazide and cyclobenzaprine. I am also having him maintain his CENTRUM PERFORMANCE, vitamin B-12, pyridoxine, Cascara Sagrada, sildenafil, Omega-3 Fatty Acids (OMEGA 3 PO), ALPRAZolam, and diclofenac.   Meds ordered this encounter  Medications  . losartan-hydrochlorothiazide (HYZAAR) 100-25 MG tablet    Sig: Take 1 tablet by mouth daily.    Dispense:  90 tablet    Refill:  1    Order Specific Question:   Supervising Provider    Answer:   Pricilla Holm A [[id number]]  . cyclobenzaprine (FLEXERIL) 10 MG tablet    Sig: Take 1 tablet (10 mg total) by mouth 3 (three) times daily as needed. for muscle spams    Dispense:  180 tablet    Refill:  1    Order Specific Question:   Supervising Provider    Answer:   Pricilla Holm A [[id number]]  . diclofenac (VOLTAREN) 75 MG EC tablet    Sig: Take 1 tablet (75 mg total) by mouth 2  (two) times daily. Alternates with Tylenol arthritis    Dispense:  180 tablet    Refill:  0    Order Specific Question:   Supervising Provider    Answer:   Pricilla Holm A [[id number]]     Follow-up: Return in about 6 months (around 12/31/2017), or if symptoms worsen or fail to improve.  Mauricio Po, FNP

## 2017-07-03 NOTE — Assessment & Plan Note (Signed)
Stable with current medication regimen and no adverse side effects. Able to function and complete activities of daily living with current medication regimen. Continue current dosage of cyclobenzaprine and diclofenac. Obtain complete metabolic profile for therapeutic drug monitoring. Continue nonpharmacological treatment including ice/heat and home exercise therapy.

## 2017-07-03 NOTE — Assessment & Plan Note (Signed)
Blood pressure well controlled with current medication regimen and no adverse side effects or hypotensive readings. Denies worse headache of life. Continue current dosage of losartan-hydrochlorothiazide. Continue to monitor blood pressure at home and follow low-sodium diet. Follow-up in 6 months or sooner if needed.

## 2017-07-09 ENCOUNTER — Other Ambulatory Visit (INDEPENDENT_AMBULATORY_CARE_PROVIDER_SITE_OTHER): Payer: Medicare Other

## 2017-07-09 DIAGNOSIS — I10 Essential (primary) hypertension: Secondary | ICD-10-CM | POA: Diagnosis not present

## 2017-07-09 LAB — COMPREHENSIVE METABOLIC PANEL
ALBUMIN: 3.9 g/dL (ref 3.5–5.2)
ALT: 14 U/L (ref 0–53)
AST: 17 U/L (ref 0–37)
Alkaline Phosphatase: 78 U/L (ref 39–117)
BILIRUBIN TOTAL: 0.7 mg/dL (ref 0.2–1.2)
BUN: 12 mg/dL (ref 6–23)
CHLORIDE: 103 meq/L (ref 96–112)
CO2: 33 meq/L — AB (ref 19–32)
CREATININE: 0.91 mg/dL (ref 0.40–1.50)
Calcium: 9.1 mg/dL (ref 8.4–10.5)
GFR: 106.94 mL/min (ref >60.00)
Glucose, Bld: 124 mg/dL — ABNORMAL HIGH (ref 70–99)
Potassium: 4.1 mEq/L (ref 3.5–5.1)
SODIUM: 140 meq/L (ref 135–145)
Total Protein: 6.6 g/dL (ref 6.0–8.3)

## 2017-08-06 DIAGNOSIS — Z23 Encounter for immunization: Secondary | ICD-10-CM | POA: Diagnosis not present

## 2017-09-14 ENCOUNTER — Ambulatory Visit (INDEPENDENT_AMBULATORY_CARE_PROVIDER_SITE_OTHER): Payer: Medicare Other | Admitting: Internal Medicine

## 2017-09-14 ENCOUNTER — Encounter: Payer: Self-pay | Admitting: Internal Medicine

## 2017-09-14 ENCOUNTER — Ambulatory Visit (INDEPENDENT_AMBULATORY_CARE_PROVIDER_SITE_OTHER)
Admission: RE | Admit: 2017-09-14 | Discharge: 2017-09-14 | Disposition: A | Discharge status: Home / Self Care | Payer: Medicare Other | Source: Ambulatory Visit | Attending: Internal Medicine | Admitting: Internal Medicine

## 2017-09-14 VITALS — BP 126/70 | HR 80 | Ht 70.0 in | Wt 157.4 lb

## 2017-09-14 DIAGNOSIS — Z87891 Personal history of nicotine dependence: Secondary | ICD-10-CM

## 2017-09-14 DIAGNOSIS — I10 Essential (primary) hypertension: Secondary | ICD-10-CM | POA: Diagnosis not present

## 2017-09-14 DIAGNOSIS — G4733 Obstructive sleep apnea (adult) (pediatric): Secondary | ICD-10-CM

## 2017-09-14 NOTE — Progress Notes (Signed)
   Subjective:    Patient ID: Carl Trujillo, male    DOB: 11/05/1950, 66 y.o.   MRN: [id number]  HPI  Male former smoker followed for OSA (former [employer]), complicated by HBP, arthritis NPSG 2007 AHI 33/ hr --------------------------------------------------------------------------------------- 09/12/2016-66 year old male former smoker followed for OSA, former [employer] CPAP 12/[facility] FOLLOWS FOR: pt doing well on cpap, denies any complaints today.  requesting new supplies order.  He feels he is sleeping well and states he is using CPAP every night. No download available at this visit. Previous downloads have not supported claim of every night use.  09/14/17- 66 year old male former smoker followed for OSA, former [employer] CPAP 12/[facility] Download-many missed days, compliance 24%, AHI 2.5/hour Dieting weight loss-in 2016 174 pounds, today 157 pounds He says CPAP hose began leaking at the connection to the machine and he has not been using it.  Lives alone but is not aware of snoring or daytime sleepiness.  Review of Systems + = Positive Constitutional: Negative for fever and unexpected weight change.  HENT: Negative for congestion, dental problem, ear pain, nosebleeds, postnasal drip, rhinorrhea, sinus pressure, sneezing, sore throat and trouble swallowing.   Eyes: Negative for redness and itching.  Respiratory: Negative for cough, chest tightness, shortness of breath and wheezing.   Cardiovascular: Negative for palpitations and leg swelling.  Gastrointestinal: Negative for nausea and vomiting.  Genitourinary: Negative for dysuria.  Musculoskeletal: Negative for joint swelling.  Skin: Negative for rash.  Neurological: Negative for headaches.  Hematological: Does not bruise/bleed easily.  Psychiatric/Behavioral: Negative for dysphoric mood. The patient is not nervous/anxious.      Objective:  OBJ- Physical Exam General- Alert,  Oriented, Affect-appropriate, Distress- none acute, + slender now after 17 pound weight loss  Skin- rash-none, lesions- none, excoriation- none Lymphadenopathy- none Head- atraumatic            Eyes- Gross vision intact, PERRLA, conjunctivae and secretions clear            Ears- Hearing, canals-normal            Nose- Clear, no-Septal dev, mucus, polyps, erosion, perforation             Throat- Mallampati II , mucosa clear , drainage- none, tonsils- atrophic, + many missing teeth Neck- flexible , trachea midline, no stridor , thyroid nl, carotid no bruit Chest - symmetrical excursion , unlabored           Heart/CV- RRR , no murmur , no gallop  , no rub, nl s1 s2                           - JVD- none , edema- none, stasis changes- none, varices- none           Lung- clear to P&A, wheeze- none, cough- none , dullness-none, rub- none           Chest wall-  Abd-  Br/ Gen/ Rectal- Not done, not indicated Extrem- cyanosis- none, clubbing, none, atrophy- none, strength- nl Neuro- grossly intact to observation    Assessment & Plan:

## 2017-09-14 NOTE — Patient Instructions (Signed)
Order- DME [facility]-    Please help with hose leak- service machine.   Continue 12 cwp, mask of choice, humidifier, supplies, AirView    Dx OSA  Order- Prevnar 13  Pneumonia vaccine  Order- CXR   Dx former smoker  Please call if we can help

## 2017-09-15 DIAGNOSIS — N5201 Erectile dysfunction due to arterial insufficiency: Secondary | ICD-10-CM | POA: Diagnosis not present

## 2017-09-15 DIAGNOSIS — N400 Enlarged prostate without lower urinary tract symptoms: Secondary | ICD-10-CM | POA: Diagnosis not present

## 2017-09-16 ENCOUNTER — Telehealth: Payer: Self-pay | Admitting: Internal Medicine

## 2017-09-16 NOTE — Telephone Encounter (Signed)
Notes recorded by Waymon BudgeYoung, Clinton D, MD on 09/14/2017 at 3:44 PM EST CXR-some changes of chronic bronchitis but no acute process. Incidental note of atherosclerosis in large arteries.   Spoke with patient regarding results. He verbalized understanding. Nothing further needed at time of call.

## 2017-09-18 DIAGNOSIS — Z87891 Personal history of nicotine dependence: Secondary | ICD-10-CM | POA: Insufficient documentation

## 2017-09-18 NOTE — Assessment & Plan Note (Addendum)
He has not been using CPAP and does not think he needs it.  Describes daytime alertness without recognized snoring.  After 17 pound weight loss, it is appropriate to let him see how he feels without CPAP.  We will ask his DME to check the machine for mechanical problem-leak at hose junction.

## 2017-09-18 NOTE — Assessment & Plan Note (Signed)
No acute symptoms.  Has not had CXR in a long time. Plan-CXR, Prevnar 13 pneumonia vaccine

## 2017-11-12 ENCOUNTER — Ambulatory Visit (INDEPENDENT_AMBULATORY_CARE_PROVIDER_SITE_OTHER): Payer: Medicare HMO | Admitting: Nurse Practitioner

## 2017-11-12 ENCOUNTER — Encounter: Payer: Self-pay | Admitting: Nurse Practitioner

## 2017-11-12 ENCOUNTER — Other Ambulatory Visit (INDEPENDENT_AMBULATORY_CARE_PROVIDER_SITE_OTHER): Payer: Self-pay

## 2017-11-12 VITALS — BP 118/68 | HR 68 | Temp 98.0°F | Resp 16 | Ht 70.0 in | Wt 156.0 lb

## 2017-11-12 DIAGNOSIS — R739 Hyperglycemia, unspecified: Secondary | ICD-10-CM | POA: Diagnosis not present

## 2017-11-12 DIAGNOSIS — M5442 Lumbago with sciatica, left side: Secondary | ICD-10-CM | POA: Diagnosis not present

## 2017-11-12 DIAGNOSIS — I10 Essential (primary) hypertension: Secondary | ICD-10-CM

## 2017-11-12 DIAGNOSIS — M25562 Pain in left knee: Secondary | ICD-10-CM

## 2017-11-12 DIAGNOSIS — G8929 Other chronic pain: Secondary | ICD-10-CM

## 2017-11-12 DIAGNOSIS — M25561 Pain in right knee: Secondary | ICD-10-CM | POA: Diagnosis not present

## 2017-11-12 LAB — BASIC METABOLIC PANEL
BUN: 11 mg/dL (ref 6–23)
CALCIUM: 9.4 mg/dL (ref 8.4–10.5)
CO2: 33 mEq/L — ABNORMAL HIGH (ref 19–32)
CREATININE: 0.89 mg/dL (ref 0.40–1.50)
Chloride: 100 mEq/L (ref 96–112)
GFR: 109.6 mL/min (ref >60.00)
Glucose, Bld: 109 mg/dL — ABNORMAL HIGH (ref 70–99)
Potassium: 3.8 mEq/L (ref 3.5–5.1)
Sodium: 137 mEq/L (ref 135–145)

## 2017-11-12 LAB — HEMOGLOBIN A1C: Hgb A1c MFr Bld: 5.2 % (ref 4.6–6.5)

## 2017-11-12 MED ORDER — LOSARTAN POTASSIUM-HCTZ 100-25 MG PO TABS: 1.0000 | ORAL_TABLET | Freq: Every day | ORAL | 1 refills | Status: DC

## 2017-11-12 MED ORDER — CYCLOBENZAPRINE HCL 10 MG PO TABS: 10.0000 mg | ORAL_TABLET | Freq: Three times a day (TID) | ORAL | 1 refills | Status: DC | PRN

## 2017-11-12 MED ORDER — DICLOFENAC SODIUM 75 MG PO TBEC: 75.0000 mg | DELAYED_RELEASE_TABLET | Freq: Two times a day (BID) | ORAL | 1 refills | Status: DC

## 2017-11-12 NOTE — Progress Notes (Signed)
Name: Carl Trujillo   MRN: [id number]    DOB: 28-Sep-1951   Date:11/12/2017       Progress Note  Subjective  Chief Complaint  Chief Complaint  Patient presents with  . Establish Care    medication refills     HPI He is coming in today to establish care. He is requesting refills of hyzaar, diclofenac, and flexeril. He is following with pulmonology for OSA and [facility] for management of his anxiety.  Hypertension -maintained on hyzaar 100-25 daily Reports daily medication compliance without adverse medication effects. Reports he does not check his blood presure at home regulaly  Denies headaches, vision changes, chest pain, shortness of breath, edema. He does try to follow a healthy diet and does not eat meat or drink sodas  BP Readings from Last 3 Encounters:  11/12/17 118/68  09/14/17 126/70  07/03/17 138/80   Back pain- This is a chronic problem. He describes as pain and muscle spasms in his upper and lower back. He takes flexeril prn to help with relief of muscle spasms. He is receiving pain injections from [facility]. The pain radiates into his legs at times. He denies weakness, numbness, tingling, incontinence, falls.  Knee pain- This is a chronic problem. He describes as aching and swelling in both knees, worse on the right. He takes diclofenac for relief of the pain. He denies weakness, numbness, heartburn, abdominal pain, erythema, bruising, falls.   Patient Active Problem List   Diagnosis Date Noted  . History of tobacco use 09/18/2017  . MIXED HYPERLIPIDEMIA 08/22/2010  . LOW BACK PAIN, CHRONIC 11/21/2009  . ARTHRITIS, KNEE 07/11/2009  . Generalized anxiety disorder 01/31/2009  . MUSCLE SPASM, BACK 07/13/2008  . Obstructive sleep apnea 02/04/2008  . ERECTILE DYSFUNCTION, MILD 06/29/2007  . ARTHRITIS, SPINE 06/29/2007  . Essential hypertension 06/28/2007    No past surgical history on file.  Family History  Problem Relation Age of  Onset  . Cancer Other   . Coronary artery disease Other     Social History   Socioeconomic History  . Marital status: Single    Spouse name: Not on file  . Number of children: Not on file  . Years of education: Not on file  . Highest education level: Not on file  Social Needs  . Financial resource strain: Not on file  . Food insecurity - worry: Not on file  . Food insecurity - inability: Not on file  . Transportation needs - medical: Not on file  . Transportation needs - non-medical: Not on file  Occupational History  . Occupation: retired  Tobacco Use  . Smoking status: Former Smoker    Years: 23.00    Types: Cigars    Last attempt to quit: 10/13/1997    Years since quitting: 20.0  . Smokeless tobacco: Never Used  . Tobacco comment: current smoker of cigars in the pm - stopped cigarettes 41yrs ago  Substance and Sexual Activity  . Alcohol use: No  . Drug use: No  . Sexual activity: Not on file  Other Topics Concern  . Not on file  Social History Narrative  . Not on file     Current Outpatient Medications:  .  alprazolam (XANAX) 1 MG tablet, Take 1 tablet (1 mg total) by mouth 3 (three) times daily as needed., Disp: 90 tablet, Rfl: 0 .  [place] 450 MG CAPS, Take by mouth., Disp: , Rfl:  .  cyclobenzaprine (FLEXERIL) 10 MG tablet, Take 1 tablet (  10 mg total) by mouth 3 (three) times daily as needed. for muscle spams, Disp: 180 tablet, Rfl: 1 .  diclofenac (VOLTAREN) 75 MG EC tablet, Take 1 tablet (75 mg total) by mouth 2 (two) times daily. Alternates with Tylenol arthritis, Disp: 180 tablet, Rfl: 0 .  losartan-hydrochlorothiazide (HYZAAR) 100-25 MG tablet, Take 1 tablet by mouth daily., Disp: 90 tablet, Rfl: 1 .  Omega-3 Fatty Acids (OMEGA 3 PO), Take by mouth. Omega XL - 2 capsules daily, Disp: , Rfl:  .  pyridoxine (B-6) 100 MG tablet, Take 100 mg by mouth daily., Disp: , Rfl:  .  sildenafil (VIAGRA) 100 MG tablet, Take 1 tablet (100 mg total) by mouth daily as  needed. Use as instructed, Disp: 5 tablet, Rfl: 11 .  Specialty Vitamins Products (CENTRUM PERFORMANCE) TABS, Take by mouth.  , Disp: , Rfl:  .  vitamin B-12 (CYANOCOBALAMIN) 1000 MCG tablet, Take 1,000 mcg by mouth daily.  , Disp: , Rfl:   No Known Allergies   ROS See HPI  Objective  Vitals:   11/12/17 1402  BP: 118/68  Pulse: 68  Resp: 16  Temp: 98 F (36.7 C)  TempSrc: Oral  SpO2: 96%  Weight: 156 lb (70.8 kg)  Height: 5\' 10"  (1.778 m)    Body mass index is 22.38 kg/m.  Physical Exam Vital signs reviewed. Constitutional: Patient appears well-developed and well-nourished. No distress.  HENT: Head: Normocephalic and atraumatic. Nose: Nose normal. Mouth/Throat: Oropharynx is clear and moist. No oropharyngeal exudate.  Eyes: Conjunctivae are normal.  No scleral icterus.  Neck: Normal range of motion. Neck supple.  Cardiovascular: Normal rate, regular rhythm and normal heart sounds.  No murmur heard. No BLE edema. Distal pulses intact. Pulmonary/Chest: Effort normal and breath sounds normal. No respiratory distress. Abdominal: Soft. Bowel sounds are normal, no distension. There is no tenderness. no masses Musculoskeletal: Normal range of motion, no joint effusions. No gross deformities Neurological: He is alert and oriented to person, place, and time. No cranial nerve deficit. Coordination, balance, strength, speech and gait are normal.  Skin: Skin is warm and dry. No rash noted. No erythema.  Psychiatric: Patient has a normal mood and affect. behavior is normal. Judgment and thought content normal.   PHQ2/9: Depression screen PHQ 2/9 07/03/2017  Decreased Interest 0  Down, Depressed, Hopeless 0  PHQ - 2 Score 0    Fall Risk: Fall Risk  07/03/2017  Falls in the past year? No   Assessment & Plan RTC for CPE   Hyperglycemia - Hemoglobin A1c; Future

## 2017-11-12 NOTE — Patient Instructions (Addendum)
I have sent your medication refills.  Please head downstairs for lab work.  Keep up the good work on [employer] diet and cooking!

## 2017-11-12 NOTE — Assessment & Plan Note (Signed)
Stable, continue meds discussed the role of healthy diet and exercise in the maintenance of blood pressure and improvement in overall health - Basic metabolic panel; Future - losartan-hydrochlorothiazide (HYZAAR) 100-25 MG tablet; Take 1 tablet by mouth daily.  Dispense: 90 tablet; Refill: 1

## 2017-11-12 NOTE — Assessment & Plan Note (Signed)
-   cyclobenzaprine (FLEXERIL) 10 MG tablet; Take 1 tablet (10 mg total) by mouth 3 (three) times daily as needed. for muscle spams  Dispense: 60 tablet; Refill: 1

## 2017-11-12 NOTE — Assessment & Plan Note (Signed)
-   diclofenac (VOLTAREN) 75 MG EC tablet; Take 1 tablet (75 mg total) by mouth 2 (two) times daily. Alternates with Tylenol arthritis  Dispense: 60 tablet; Refill: 1

## 2017-12-30 ENCOUNTER — Telehealth: Payer: Self-pay

## 2017-12-30 NOTE — Telephone Encounter (Signed)
Received a PA for cyclobenzaprine. On cover my meds it states that it is recommended for pts 65 and over not take this medication because it is not safe. They gave alternatives baclofen or tizanidine. Please advise.

## 2017-12-31 MED ORDER — TIZANIDINE HCL 2 MG PO TABS: 2.0000 mg | ORAL_TABLET | Freq: Three times a day (TID) | ORAL | 1 refills | Status: DC | PRN

## 2017-12-31 NOTE — Telephone Encounter (Signed)
Sent tizanidine 2mg  TID. Please tell him to use this medication only as needed and as infrequently as possible to manage his symptoms

## 2018-01-01 NOTE — Telephone Encounter (Signed)
Spoke with pt to let him know response below. Pt states that he has taken the flexeril for 19 years and it works well for him and never has caused any problems. He stated that he will try the tizanidine but he prefers the flexeril. He is going to bring a letter by that states that he needs a letter from his PCP stating that flexeril works for him and he can not take anything else. I advised that he try the tizanidine before a letter like that could possibly be written. Pt understood.

## 2018-01-07 ENCOUNTER — Other Ambulatory Visit: Payer: Self-pay | Admitting: Nurse Practitioner

## 2018-01-07 DIAGNOSIS — M25562 Pain in left knee: Principal | ICD-10-CM

## 2018-01-07 DIAGNOSIS — M25561 Pain in right knee: Principal | ICD-10-CM

## 2018-01-07 DIAGNOSIS — G8929 Other chronic pain: Secondary | ICD-10-CM

## 2018-01-28 ENCOUNTER — Ambulatory Visit: Payer: Self-pay | Admitting: Nurse Practitioner

## 2018-03-16 NOTE — Progress Notes (Addendum)
Subjective:   Carl Trujillo is a 67 y.o. male who presents for an Initial Medicare Annual Wellness Visit.  Review of Systems  No ROS.  Medicare Wellness Visit. Additional risk factors are reflected in the social history.  Cardiac Risk Factors include: dyslipidemia;hypertension;male gender Sleep patterns: feels rested on waking, gets up 1-2 times nightly to void and sleeps 7-8 hours nightly.    Home Safety/Smoke Alarms: Feels safe in home. Smoke alarms in place.  Living environment; residence and Firearm Safety: 1-story house/ trailer, no firearms, Lives alone, no needs for DME, good support system. Seat Belt Safety/Bike Helmet: Wears seat belt.    Objective:    There were no vitals filed for this visit. There is no height or weight on file to calculate BMI.  Advanced Directives 03/17/2018  Does Patient Have a Medical Advance Directive? Yes  Type of Estate agent of [place];Living will  Copy of Healthcare Power of Attorney in Chart? No - copy requested    Current Medications (verified) Outpatient Encounter Medications as of 03/17/2018  Medication Sig  . alprazolam (XANAX) 1 MG tablet Take 1 tablet (1 mg total) by mouth 3 (three) times daily as needed.  Carl Trujillo 450 MG CAPS Take by mouth.  . diclofenac (VOLTAREN) 75 MG EC tablet TAKE 1 TABLET BY MOUTH TWICE DAILY ALTERNATES  WITH  TYLENOL  ARTHRITIS  . losartan-hydrochlorothiazide (HYZAAR) 100-25 MG tablet Take 1 tablet by mouth daily.  . Omega-3 Fatty Acids (OMEGA 3 PO) Take by mouth. Omega XL - 2 capsules daily  . pyridoxine (B-6) 100 MG tablet Take 100 mg by mouth daily.  . sildenafil (VIAGRA) 100 MG tablet Take 1 tablet (100 mg total) by mouth daily as needed. Use as instructed  . Specialty Vitamins Products (CENTRUM PERFORMANCE) TABS Take by mouth.    [name] tiZANidine (ZANAFLEX) 2 MG tablet Take 1 tablet (2 mg total) by mouth every 8 (eight) hours as needed for muscle spasms.  . vitamin B-12  (CYANOCOBALAMIN) 1000 MCG tablet Take 1,000 mcg by mouth daily.     No facility-administered encounter medications on file as of 03/17/2018.     Allergies (verified) Patient has no known allergies.   History: Past Medical History:  Diagnosis Date  . Anxiety   . Arthritis of right knee   . Arthritis of spine   . ED (erectile dysfunction)   . Hypertension    No past surgical history on file. Family History  Problem Relation Age of Onset  . Cancer Other   . Coronary artery disease Other    Social History   Socioeconomic History  . Marital status: Single    Spouse name: Not on file  . Number of children: Not on file  . Years of education: Not on file  . Highest education level: Not on file  Occupational History  . Occupation: retired  Engineer, production  . Financial resource strain: Not hard at all  . Food insecurity:    Worry: Never true    Inability: Never true  . Transportation needs:    Medical: No    Non-medical: No  Tobacco Use  . Smoking status: Former Smoker    Years: 23.00    Types: Cigars    Last attempt to quit: 10/13/1997    Years since quitting: 20.4  . Smokeless tobacco: Never Used  . Tobacco comment: current smoker of cigars in the pm - stopped cigarettes 45yrs ago  Substance and Sexual Activity  . Alcohol use:  No  . Drug use: No  . Sexual activity: Not Currently  Lifestyle  . Physical activity:    Days per week: 5 days    Minutes per session: 50 min  . Stress: Not at all  Relationships  . Social connections:    Talks on phone: More than three times a week    Gets together: More than three times a week    Attends religious service: More than 4 times per year    Active member of club or organization: Yes    Attends meetings of clubs or organizations: More than 4 times per year    Relationship status: Not on file  Other Topics Concern  . Not on file  Social History Narrative  . Not on file   Tobacco Counseling Counseling given: Not  Answered Comment: current smoker of cigars in the pm - stopped cigarettes 5872yrs ago  Activities of Daily Living In your present state of health, do you have any difficulty performing the following activities: 03/17/2018  Hearing? N  Vision? N  Difficulty concentrating or making decisions? N  Walking or climbing stairs? N  Dressing or bathing? N  Doing errands, shopping? N  Preparing Food and eating ? N  Using the Toilet? N  In the past six months, have you accidently leaked urine? N  Do you have problems with loss of bowel control? N  Managing your Medications? N  Managing your Finances? N  Housekeeping or managing your Housekeeping? N  Some recent data might be hidden     Immunizations and Health Maintenance Immunization History  Administered Date(s) Administered  . Influenza Split 06/27/2013, 08/22/2016, 07/13/2017  . Influenza Whole 07/13/2008, 07/11/2009  . Influenza-Unspecified 07/18/2014  . Pneumococcal Polysaccharide-23 08/22/2016  . Td 07/13/2008   There are no preventive care reminders to display for this patient.  Patient Care Team: Evaristo BuryShambley, Ashleigh N, NP as PCP - General (Nurse Practitioner) Jerilee FieldEskridge, Matthew, MD as Consulting Physician (Urology)  Indicate any recent Medical Services you may have received from other than [facility] providers in the past year (date may be approximate).    Assessment:   This is a routine wellness examination for Carl Trujillo. Physical assessment deferred to PCP.   Hearing/Vision screen Hearing Screening Comments: Able to hear conversational tones w/o difficulty. No issues reported.  Passed whisper test Vision Screening Comments: appointment yearly   Dietary issues and exercise activities discussed: Current Exercise Habits: Home exercise routine, Type of exercise: walking, Time (Minutes): 50, Frequency (Times/Week): 4, Weekly Exercise (Minutes/Week): 200, Intensity: Mild, Exercise limited by: None identified  Diet (meal preparation, eat  out, water intake, caffeinated beverages, dairy products, fruits and vegetables): in general, a "healthy" diet  , well balanced, eats a variety of fruits and vegetables daily, limits salt, fat/cholesterol, sugar,carbohydrates,caffeine, drinks 6-8 glasses of water daily.  Goals    . Patient Stated     Stay as healthy and as independent as possible. Enjoy life, family, and travel.      Depression Screen PHQ 2/9 Scores 03/17/2018 07/03/2017  PHQ - 2 Score 0 0  PHQ- 9 Score 0 -    Fall Risk Fall Risk  03/17/2018 07/03/2017  Falls in the past year? No No   Cognitive Function: MMSE - Mini Mental State Exam 03/17/2018  Orientation to time 5  Orientation to Place 5  Registration 3  Attention/ Calculation 5  Recall 2  Language- name 2 objects 2  Language- repeat 1  Language- follow 3 step command 3  Language- read & follow direction 1  Write a sentence 1  Copy design 1  Total score 29        Screening Tests Health Maintenance  Topic Date Due  . PNA vac Low Risk Adult (2 of 2 - PCV13) 03/18/2019 (Originally 08/22/2017)  . INFLUENZA VACCINE  05/13/2018  . TETANUS/TDAP  07/13/2018  . COLONOSCOPY  01/22/2028  . Hepatitis C Screening  Completed      Plan:     Continue doing brain stimulating activities (puzzles, reading, adult coloring books, staying active) to keep memory sharp.   Continue to eat heart healthy diet (full of fruits, vegetables, whole grains, lean protein, water--limit salt, fat, and sugar intake) and increase physical activity as tolerated.  I have personally reviewed and noted the following in the patient's chart:   . Medical and social history . Use of alcohol, tobacco or illicit drugs  . Current medications and supplements . Functional ability and status . Nutritional status . Physical activity . Advanced directives . List of other physicians . Vitals . Screenings to include cognitive, depression, and falls . Referrals and appointments  In addition, I  have reviewed and discussed with patient certain preventive protocols, quality metrics, and best practice recommendations. A written personalized care plan for preventive services as well as general preventive health recommendations were provided to patient.     Wanda Plump, RN   03/17/2018     Medical screening examination/treatment/procedure(s) were performed by the [employer], RN. As primary care provider I was immediately available for consulation/collaboration. I agree with above documentation. Alphonse Guild, NP

## 2018-03-17 ENCOUNTER — Encounter: Payer: Self-pay | Admitting: Nurse Practitioner

## 2018-03-17 ENCOUNTER — Ambulatory Visit (INDEPENDENT_AMBULATORY_CARE_PROVIDER_SITE_OTHER): Payer: Medicare HMO | Admitting: Nurse Practitioner

## 2018-03-17 ENCOUNTER — Ambulatory Visit (INDEPENDENT_AMBULATORY_CARE_PROVIDER_SITE_OTHER): Payer: Medicare HMO | Admitting: *Deleted

## 2018-03-17 VITALS — BP 120/80 | HR 60 | Temp 98.3°F | Resp 16 | Ht 70.0 in | Wt 157.0 lb

## 2018-03-17 VITALS — BP 120/80 | HR 60 | Resp 18 | Ht 70.0 in | Wt 157.0 lb

## 2018-03-17 DIAGNOSIS — G8929 Other chronic pain: Secondary | ICD-10-CM

## 2018-03-17 DIAGNOSIS — T428X5A Adverse effect of antiparkinsonism drugs and other central muscle-tone depressants, initial encounter: Secondary | ICD-10-CM | POA: Diagnosis not present

## 2018-03-17 DIAGNOSIS — M5442 Lumbago with sciatica, left side: Secondary | ICD-10-CM | POA: Diagnosis not present

## 2018-03-17 DIAGNOSIS — Z0000 Encounter for general adult medical examination without abnormal findings: Secondary | ICD-10-CM | POA: Diagnosis not present

## 2018-03-17 MED ORDER — CYCLOBENZAPRINE HCL 10 MG PO TABS: 10.0000 mg | ORAL_TABLET | Freq: Two times a day (BID) | ORAL | 1 refills | Status: DC

## 2018-03-17 NOTE — Patient Instructions (Signed)
Please return in about 3 months for annual physical with fasting lab work.

## 2018-03-17 NOTE — Patient Instructions (Addendum)
Continue doing brain stimulating activities (puzzles, reading, adult coloring books, staying active) to keep memory sharp.   Continue to eat heart healthy diet (full of fruits, vegetables, whole grains, lean protein, water--limit salt, fat, and sugar intake) and increase physical activity as tolerated.   Carl Trujillo , Thank you for taking time to come for your Medicare Wellness Visit. I appreciate your ongoing commitment to your health goals. Please review the following plan we discussed and let me know if I can assist you in the future.   These are the goals we discussed: Goals    . Patient Stated     Stay as healthy and as independent as possible. Enjoy life, family, and travel.       This is a list of the screening recommended for you and due dates:  Health Maintenance  Topic Date Due  . Pneumonia vaccines (2 of 2 - PCV13) 03/18/2019*  . Flu Shot  05/13/2018  . Tetanus Vaccine  07/13/2018  . Colon Cancer Screening  01/22/2028  .  Hepatitis C: One time screening is recommended by Center for Disease Control  (CDC) for  adults born from [birth year] through 1966.   Completed  *Topic was postponed. The date shown is not the original due date.     Health Maintenance, Male A healthy lifestyle and preventive care is important for your health and wellness. Ask your health care provider about what schedule of regular examinations is right for you. What should I know about weight and diet? Eat a Healthy Diet  Eat plenty of vegetables, fruits, whole grains, low-fat dairy products, and lean protein.  Do not eat a lot of foods high in solid fats, added sugars, or salt.  Maintain a Healthy Weight Regular exercise can help you achieve or maintain a healthy weight. You should:  Do at least 150 minutes of exercise each week. The exercise should increase your heart rate and make you sweat (moderate-intensity exercise).  Do strength-training exercises at least twice a week.  Watch Your Levels of  Cholesterol and Blood Lipids  Have your blood tested for lipids and cholesterol every 5 years starting at 68 years of age. If you are at high risk for heart disease, you should start having your blood tested when you are 68 years old. You may need to have your cholesterol levels checked more often if: ? Your lipid or cholesterol levels are high. ? You are older than 68 years of age. ? You are at high risk for heart disease.  What should I know about cancer screening? Many types of cancers can be detected early and may often be prevented. Lung Cancer  You should be screened every year for lung cancer if: ? You are a current smoker who has smoked for at least 30 years. ? You are a former smoker who has quit within the past 15 years.  Talk to your health care provider about your screening options, when you should start screening, and how often you should be screened.  Colorectal Cancer  Routine colorectal cancer screening usually begins at 68 years of age and should be repeated every 5-10 years until you are 68 years old. You may need to be screened more often if early forms of precancerous polyps or small growths are found. Your health care provider may recommend screening at an earlier age if you have risk factors for colon cancer.  Your health care provider may recommend using home test kits to check for hidden blood in  the stool.  A small camera at the end of a tube can be used to examine your colon (sigmoidoscopy or colonoscopy). This checks for the earliest forms of colorectal cancer.  Prostate and Testicular Cancer  Depending on your age and overall health, your health care provider may do certain tests to screen for prostate and testicular cancer.  Talk to your health care provider about any symptoms or concerns you have about testicular or prostate cancer.  Skin Cancer  Check your skin from head to toe regularly.  Tell your health care provider about any new moles or changes  in moles, especially if: ? There is a change in a mole's size, shape, or color. ? You have a mole that is larger than a pencil eraser.  Always use sunscreen. Apply sunscreen liberally and repeat throughout the day.  Protect yourself by wearing long sleeves, pants, a wide-brimmed hat, and sunglasses when outside.  What should I know about heart disease, diabetes, and high blood pressure?  If you are 1718-68 years of age, have your blood pressure checked every 3-5 years. If you are 68 years of age or older, have your blood pressure checked every year. You should have your blood pressure measured twice-once when you are at a hospital or clinic, and once when you are not at a hospital or clinic. Record the average of the two measurements. To check your blood pressure when you are not at a hospital or clinic, you can use: ? An automated blood pressure machine at a pharmacy. ? A home blood pressure monitor.  Talk to your health care provider about your target blood pressure.  If you are between 445-68 years old, ask your health care provider if you should take aspirin to prevent heart disease.  Have regular diabetes screenings by checking your fasting blood sugar level. ? If you are at a normal weight and have a low risk for diabetes, have this test once every three years after the age of 68. ? If you are overweight and have a high risk for diabetes, consider being tested at a younger age or more often.  A one-time screening for abdominal aortic aneurysm (AAA) by ultrasound is recommended for men aged 65-75 years who are current or former smokers. What should I know about preventing infection? Hepatitis B If you have a higher risk for hepatitis B, you should be screened for this virus. Talk with your health care provider to find out if you are at risk for hepatitis B infection. Hepatitis C Blood testing is recommended for:  Everyone born from [birth year] through 1965.  Anyone with known risk factors  for hepatitis C.  Sexually Transmitted Diseases (STDs)  You should be screened each year for STDs including gonorrhea and chlamydia if: ? You are sexually active and are younger than 68 years of age. ? You are older than 68 years of age and your health care provider tells you that you are at risk for this type of infection. ? Your sexual activity has changed since you were last screened and you are at an increased risk for chlamydia or gonorrhea. Ask your health care provider if you are at risk.  Talk with your health care provider about whether you are at high risk of being infected with HIV. Your health care provider may recommend a prescription medicine to help prevent HIV infection.  What else can I do?  Schedule regular health, dental, and eye exams.  Stay current with your vaccines (immunizations).  Do not use any tobacco products, such as cigarettes, chewing tobacco, and e-cigarettes. If you need help quitting, ask your health care provider.  Limit alcohol intake to no more than 2 drinks per day. One drink equals 12 ounces of beer, 5 ounces of wine, or 1 ounces of hard liquor.  Do not use street drugs.  Do not share needles.  Ask your health care provider for help if you need support or information about quitting drugs.  Tell your health care provider if you often feel depressed.  Tell your health care provider if you have ever been abused or do not feel safe at home. This information is not intended to replace advice given to you by your health care provider. Make sure you discuss any questions you have with your health care provider. Document Released: 03/27/2008 Document Revised: 05/28/2016 Document Reviewed: 07/03/2015 Elsevier Interactive Patient Education  [employer].

## 2018-03-17 NOTE — Progress Notes (Signed)
Name: Carl Trujillo   MRN: [id number]    DOB: 06/15/1951   Date:03/17/2018       Progress Note  Subjective  Chief Complaint  Chief Complaint  Patient presents with  . Follow-up    wants to get back on cyclobenzaprine and off tizanidine, states that tizanidine give him a weird reaction    HPI Mr Carl Trujillo is here today to discuss medications for back pain. He has been maintained on flexeril prn, for some time now for relief of muscle spasms, but due to insurance request was switched to tizanidine in march. He has tried the tizanidine but says it makes him feel shaky and does not provide any relief of his back pain He is requesting insurance PA to resume flexeril, which he says he took on as needed basis  for many years with no adverse requests. He denies syncope, weakness, falls, bowel or bladder changes He says he is sleeping well, eating well and overall feels well   Patient Active Problem List   Diagnosis Date Noted  . History of tobacco use 09/18/2017  . MIXED HYPERLIPIDEMIA 08/22/2010  . LOW BACK PAIN, CHRONIC 11/21/2009  . Knee pain 07/11/2009  . Generalized anxiety disorder 01/31/2009  . Back pain 07/13/2008  . Obstructive sleep apnea 02/04/2008  . ERECTILE DYSFUNCTION, MILD 06/29/2007  . ARTHRITIS, SPINE 06/29/2007  . Essential hypertension 06/28/2007    History reviewed. No pertinent surgical history.  Family History  Problem Relation Age of Onset  . Cancer Other   . Coronary artery disease Other     Social History   Socioeconomic History  . Marital status: Single    Spouse name: Not on file  . Number of children: Not on file  . Years of education: Not on file  . Highest education level: Not on file  Occupational History  . Occupation: retired  Engineer, productionocial Needs  . Financial resource strain: Not hard at all  . Food insecurity:    Worry: Never true    Inability: Never true  . Transportation needs:    Medical: No    Non-medical: No  Tobacco Use  . Smoking  status: Former Smoker    Years: 23.00    Types: Cigars    Last attempt to quit: 10/13/1997    Years since quitting: 20.4  . Smokeless tobacco: Never Used  . Tobacco comment: current smoker of cigars in the pm - stopped cigarettes 4659yrs ago  Substance and Sexual Activity  . Alcohol use: No  . Drug use: No  . Sexual activity: Not Currently  Lifestyle  . Physical activity:    Days per week: 5 days    Minutes per session: 50 min  . Stress: Not at all  Relationships  . Social connections:    Talks on phone: More than three times a week    Gets together: More than three times a week    Attends religious service: More than 4 times per year    Active member of club or organization: Yes    Attends meetings of clubs or organizations: More than 4 times per year    Relationship status: Not on file  . Intimate partner violence:    Fear of current or ex partner: Not on file    Emotionally abused: Not on file    Physically abused: Not on file    Forced sexual activity: Not on file  Other Topics Concern  . Not on file  Social History Narrative  . Not on  file     Current Outpatient Medications:  .  alprazolam (XANAX) 1 MG tablet, Take 1 tablet (1 mg total) by mouth 3 (three) times daily as needed., Disp: 90 tablet, Rfl: 0 .  Cascara [place]rada 450 MG CAPS, Take by mouth., Disp: , Rfl:  .  diclofenac (VOLTAREN) 75 MG EC tablet, TAKE 1 TABLET BY MOUTH TWICE DAILY ALTERNATES  WITH  TYLENOL  ARTHRITIS, Disp: 60 tablet, Rfl: 1 .  losartan-hydrochlorothiazide (HYZAAR) 100-25 MG tablet, Take 1 tablet by mouth daily., Disp: 90 tablet, Rfl: 1 .  Omega-3 Fatty Acids (OMEGA 3 PO), Take by mouth. Omega XL - 2 capsules daily, Disp: , Rfl:  .  pyridoxine (B-6) 100 MG tablet, Take 100 mg by mouth daily., Disp: , Rfl:  .  sildenafil (VIAGRA) 100 MG tablet, Take 1 tablet (100 mg total) by mouth daily as needed. Use as instructed, Disp: 5 tablet, Rfl: 11 .  Specialty Vitamins Products (CENTRUM PERFORMANCE) TABS,  Take by mouth.  , Disp: , Rfl:  .  tiZANidine (ZANAFLEX) 2 MG tablet, Take 1 tablet (2 mg total) by mouth every 8 (eight) hours as needed for muscle spasms., Disp: 30 tablet, Rfl: 1 .  vitamin B-12 (CYANOCOBALAMIN) 1000 MCG tablet, Take 1,000 mcg by mouth daily.  , Disp: , Rfl:  .  cyclobenzaprine (FLEXERIL) 10 MG tablet, Take 1 tablet (10 mg total) by mouth 2 (two) times daily., Disp: 30 tablet, Rfl: 1  No Known Allergies   ROS See HPI  Objective  Vitals:   03/17/18 1450  BP: 120/80  Pulse: 60  Resp: 16  Temp: 98.3 F (36.8 C)  TempSrc: Oral  SpO2: 97%  Weight: 157 lb (71.2 kg)  Height: 5\' 10"  (1.778 m)    Body mass index is 22.53 kg/m.  Physical Exam Vital signs reviewed. Constitutional: Patient appears well-developed and well-nourished. No distress.  HENT: Head: Normocephalic and atraumatic. Nose: Nose normal. Mouth/Throat: Oropharynx is clear and moist. No oropharyngeal exudate.  Eyes: Conjunctivae are normal.  No scleral icterus.  Neck: Normal range of motion. Neck supple.  Cardiovascular: Normal rate, regular rhythm and normal heart sounds.  No murmur heard. No BLE edema. Distal pulses intact. Pulmonary/Chest: Effort normal and breath sounds normal. No respiratory distress. Musculoskeletal: Normal range of motion, no joint effusions. No gross deformities. Cervical, thoracic and lumbar back without tenderness, swelling or deformity Neurological: He is alert and oriented to person, place, and time. No cranial nerve deficit. Coordination, balance, strength, speech and gait are normal.  Skin: Skin is warm and dry. No rash noted. No erythema.  Psychiatric: Patient has a normal mood and affect. behavior is normal. Judgment and thought content normal.   PHQ2/9: Depression screen [facility] 2/9 03/17/2018 07/03/2017  Decreased Interest 0 0  Down, Depressed, Hopeless 0 0  PHQ - 2 Score 0 0  Altered sleeping 0 -  Tired, decreased energy 0 -  Change in appetite 0 -  Feeling bad  or failure about yourself  0 -  Trouble concentrating 0 -  Moving slowly or fidgety/restless 0 -  Suicidal thoughts 0 -  PHQ-9 Score 0 -  Difficult doing work/chores Not difficult at all -    Fall Risk: Fall Risk  03/17/2018 07/03/2017  Falls in the past year? No No    Assessment & Plan RTC in 3 months for CPE  Chronic bilateral low back pain with left-sided sciatica Have sent one refill of flexeril to local pharmacy Will complete prior auth for flexeril  PRN when needed We did discuss drowsiness as a side effect of flexeril and he was instructed not to drink alcohol, operate machinery, or take other sedating medications when taking flexeril and he verbalizes understanding - cyclobenzaprine (FLEXERIL) 10 MG tablet; Take 1 tablet (10 mg total) by mouth 2 (two) times daily.  Dispense: 30 tablet; Refill: 1

## 2018-03-18 ENCOUNTER — Telehealth: Payer: Self-pay

## 2018-03-18 NOTE — Telephone Encounter (Signed)
PA for flexeril has been initiated.  KEY: [id number]

## 2018-03-19 ENCOUNTER — Telehealth: Payer: Self-pay | Admitting: Nurse Practitioner

## 2018-03-19 DIAGNOSIS — M25562 Pain in left knee: Principal | ICD-10-CM

## 2018-03-19 DIAGNOSIS — G8929 Other chronic pain: Secondary | ICD-10-CM

## 2018-03-19 DIAGNOSIS — M25561 Pain in right knee: Principal | ICD-10-CM

## 2018-03-19 MED ORDER — DICLOFENAC SODIUM 75 MG PO TBEC: DELAYED_RELEASE_TABLET | ORAL | 1 refills | Status: DC

## 2018-03-19 NOTE — Telephone Encounter (Signed)
Copied from CRM [phone]. Topic: Quick Communication - Rx Refill/Question >> Mar 19, 2018  3:04 PM Gerrianne ScalePayne, Maile Linford L wrote: Medication: diclofenac (VOLTAREN) 75 MG EC tablet  Has the patient contacted their pharmacy? Yes.  Call practice (Agent: If no, request that the patient contact the pharmacy for the refill.) (Agent: If yes, when and what did the pharmacy advise?)  Preferred Pharmacy (with phone number or street name):     [facility] [address] - [place], [place] - [id number] [phone] (Phone) [phone] (Fax)      Agent: Please be advised that RX refills may take up to 3 business days. We ask that you follow-up with your pharmacy.

## 2018-03-19 NOTE — Telephone Encounter (Signed)
LOV 03/15/18 NOV 06/17/18  [facility] #[id number]  Alphonse GuildAshleigh Shambley, NP

## 2018-03-22 NOTE — Telephone Encounter (Signed)
PA has been approved. Pharmacy is aware.

## 2018-06-17 ENCOUNTER — Encounter: Payer: Self-pay | Admitting: Nurse Practitioner

## 2018-06-24 ENCOUNTER — Encounter: Payer: Self-pay | Admitting: Nurse Practitioner

## 2018-06-24 ENCOUNTER — Other Ambulatory Visit (INDEPENDENT_AMBULATORY_CARE_PROVIDER_SITE_OTHER): Payer: Medicare HMO

## 2018-06-24 ENCOUNTER — Telehealth: Payer: Self-pay | Admitting: *Deleted

## 2018-06-24 ENCOUNTER — Ambulatory Visit (INDEPENDENT_AMBULATORY_CARE_PROVIDER_SITE_OTHER): Payer: Medicare HMO | Admitting: Nurse Practitioner

## 2018-06-24 VITALS — BP 130/80 | HR 71 | Ht 70.0 in | Wt 153.8 lb

## 2018-06-24 DIAGNOSIS — M5442 Lumbago with sciatica, left side: Secondary | ICD-10-CM

## 2018-06-24 DIAGNOSIS — M25562 Pain in left knee: Secondary | ICD-10-CM

## 2018-06-24 DIAGNOSIS — Z23 Encounter for immunization: Secondary | ICD-10-CM | POA: Diagnosis not present

## 2018-06-24 DIAGNOSIS — Z0001 Encounter for general adult medical examination with abnormal findings: Secondary | ICD-10-CM | POA: Insufficient documentation

## 2018-06-24 DIAGNOSIS — G8929 Other chronic pain: Secondary | ICD-10-CM

## 2018-06-24 DIAGNOSIS — Z0000 Encounter for general adult medical examination without abnormal findings: Secondary | ICD-10-CM | POA: Diagnosis not present

## 2018-06-24 DIAGNOSIS — Z13220 Encounter for screening for lipoid disorders: Secondary | ICD-10-CM

## 2018-06-24 DIAGNOSIS — E782 Mixed hyperlipidemia: Secondary | ICD-10-CM

## 2018-06-24 DIAGNOSIS — M25561 Pain in right knee: Secondary | ICD-10-CM

## 2018-06-24 DIAGNOSIS — I10 Essential (primary) hypertension: Secondary | ICD-10-CM

## 2018-06-24 LAB — LIPID PANEL
CHOLESTEROL: 151 mg/dL (ref 0–200)
HDL: 52.6 mg/dL (ref >39.00)
LDL Cholesterol: 84 mg/dL (ref 0–99)
NonHDL: 98.24
Total CHOL/HDL Ratio: 3
Triglycerides: 70 mg/dL (ref 0.0–149.0)
VLDL: 14 mg/dL (ref 0.0–40.0)

## 2018-06-24 LAB — COMPREHENSIVE METABOLIC PANEL
ALK PHOS: 86 U/L (ref 39–117)
ALT: 20 U/L (ref 0–53)
AST: 20 U/L (ref 0–37)
Albumin: 4.1 g/dL (ref 3.5–5.2)
BUN: 11 mg/dL (ref 6–23)
CO2: 30 mEq/L (ref 19–32)
CREATININE: 1 mg/dL (ref 0.40–1.50)
Calcium: 9.3 mg/dL (ref 8.4–10.5)
Chloride: 101 mEq/L (ref 96–112)
GFR: 95.63 mL/min (ref >60.00)
GLUCOSE: 102 mg/dL — AB (ref 70–99)
Potassium: 3.6 mEq/L (ref 3.5–5.1)
Sodium: 139 mEq/L (ref 135–145)
TOTAL PROTEIN: 7.2 g/dL (ref 6.0–8.3)
Total Bilirubin: 1.2 mg/dL (ref 0.2–1.2)

## 2018-06-24 LAB — CBC
HCT: 41.5 % (ref 39.0–52.0)
Hemoglobin: 14.5 g/dL (ref 13.0–17.0)
MCHC: 34.8 g/dL (ref 30.0–36.0)
MCV: 90.4 fl (ref 78.0–100.0)
Platelets: 311 10*3/uL (ref 150.0–400.0)
RBC: 4.59 Mil/uL (ref 4.22–5.81)
RDW: 13.6 % (ref 11.5–15.5)
WBC: 5.3 10*3/uL (ref 4.0–10.5)

## 2018-06-24 MED ORDER — CYCLOBENZAPRINE HCL 10 MG PO TABS: 10.0000 mg | ORAL_TABLET | Freq: Two times a day (BID) | ORAL | 1 refills | Status: DC

## 2018-06-24 MED ORDER — DICLOFENAC SODIUM 75 MG PO TBEC: DELAYED_RELEASE_TABLET | ORAL | 1 refills | Status: DC

## 2018-06-24 MED ORDER — LOSARTAN POTASSIUM-HCTZ 100-25 MG PO TABS: 1.0000 | ORAL_TABLET | Freq: Every day | ORAL | 1 refills | Status: DC

## 2018-06-24 NOTE — Assessment & Plan Note (Addendum)
Discussed use of flexeril prn only, dosing and side effects discussed Continue flexeril prn RTC in 6 months for follow up or sooner for new, worsening symptoms - cyclobenzaprine (FLEXERIL) 10 MG tablet; Take 1 tablet (10 mg total) by mouth 2 (two) times daily.  Dispense: 30 tablet; Refill: 1

## 2018-06-24 NOTE — Progress Notes (Signed)
Name: Carl Trujillo   MRN: [id number]    DOB: 07/07/1951   Date:06/24/2018       Progress Note  Subjective  Chief Complaint CPE  HPI  Patient presents for annual CPE. He is also requesting refill for voltaren and dicofenac, taking as needed for knee and back pain. He says he does not take daily but does take several days a week without any noted adverse effects.  USPSTF grade A and B recommendations:  Diet, Exercise: walking, sit ups daily; cooks at home- baked and grilled options, vegetables daily  Depression: no concerns [facility] 03/17/2018 07/03/2017  Decreased Interest 0 0  Down, Depressed, Hopeless 0 0  PHQ - 2 Score 0 0  Altered sleeping 0 -  Tired, decreased energy 0 -  Change in appetite 0 -  Feeling bad or failure about yourself  0 -  Trouble concentrating 0 -  Moving slowly or fidgety/restless 0 -  Suicidal thoughts 0 -  PHQ-9 Score 0 -  Difficult doing work/chores Not difficult at all -   Hypertension: maintained on hyzaar 100-25 daily Taking daily as prescribed without noted adverse effects BP Readings from Last 3 Encounters:  06/24/18 130/80  03/17/18 120/80  03/17/18 120/80   Obesity: Wt Readings from Last 3 Encounters:  06/24/18 153 lb 12 oz (69.7 kg)  03/17/18 157 lb (71.2 kg)  03/17/18 157 lb (71.2 kg)   BMI Readings from Last 3 Encounters:  06/24/18 22.06 kg/m  03/17/18 22.53 kg/m  03/17/18 22.53 kg/m   Lipids: Lipid panel today, he Is taking omega 3 fish oil daily Lab Results  Component Value Date   CHOL 178 02/19/2011   CHOL 187 11/14/2009   Lab Results  Component Value Date   HDL 49.50 02/19/2011   HDL 47.20 11/14/2009   Lab Results  Component Value Date   LDLCALC 102 (H) 02/19/2011   LDLCALC 105 (H) 11/14/2009   Lab Results  Component Value Date   TRIG 132.0 02/19/2011   TRIG 175.0 (H) 11/14/2009   Lab Results  Component Value Date   CHOLHDL 4 02/19/2011   CHOLHDL 4 11/14/2009   No results found for:  LDLDIRECT  Glucose:  Glucose, Bld  Date Value Ref Range Status  11/12/2017 109 (H) 70 - 99 mg/dL Final  82/95/621309/27/2018 086124 (H) 70 - 99 mg/dL Final  57/84/696212/02/2016 96 70 - 99 mg/dL Final   Alcohol: occasional drinker Tobacco use: no, never  Hep C: screening done in past   Skin cancer: wearing sunscreen regularly   Colorectal cancer: colonoscopy up to date  Prostate cancer: sees urology with annual PSA done in urology office Lab Results  Component Value Date   PSA 0.72 02/19/2011   Aspirin: taking 81 mg daily ECG:  Not indicated  Vaccinations: influenza vacc done today  Advanced Care Planning: A voluntary discussion about advance care planning including the explanation and discussion of advance directives.  Discussed health care proxy and Living will, and the patient DOES have a living will at present time. If patient does have living will, I have requested they bring this to the clinic to be scanned in to their chart.  Patient Active Problem List   Diagnosis Date Noted  . History of tobacco use 09/18/2017  . MIXED HYPERLIPIDEMIA 08/22/2010  . LOW BACK PAIN, CHRONIC 11/21/2009  . Knee pain 07/11/2009  . Generalized anxiety disorder 01/31/2009  . Back pain 07/13/2008  . Obstructive sleep apnea 02/04/2008  . ERECTILE DYSFUNCTION, MILD 06/29/2007  .  ARTHRITIS, SPINE 06/29/2007  . Essential hypertension 06/28/2007    History reviewed. No pertinent surgical history.  Family History  Problem Relation Age of Onset  . Cancer Other   . Coronary artery disease Other     Social History   Socioeconomic History  . Marital status: Single    Spouse name: Not on file  . Number of children: Not on file  . Years of education: Not on file  . Highest education level: Not on file  Occupational History  . Occupation: retired  Engineer, production  . Financial resource strain: Not hard at all  . Food insecurity:    Worry: Never true    Inability: Never true  . Transportation needs:     Medical: No    Non-medical: No  Tobacco Use  . Smoking status: Former Smoker    Years: 23.00    Types: Cigars    Last attempt to quit: 10/13/1997    Years since quitting: 20.7  . Smokeless tobacco: Never Used  . Tobacco comment: current smoker of cigars in the pm - stopped cigarettes 71yrs ago  Substance and Sexual Activity  . Alcohol use: No  . Drug use: No  . Sexual activity: Not Currently  Lifestyle  . Physical activity:    Days per week: 5 days    Minutes per session: 50 min  . Stress: Not at all  Relationships  . Social connections:    Talks on phone: More than three times a week    Gets together: More than three times a week    Attends religious service: More than 4 times per year    Active member of club or organization: Yes    Attends meetings of clubs or organizations: More than 4 times per year    Relationship status: Not on file  . Intimate partner violence:    Fear of current or ex partner: Not on file    Emotionally abused: Not on file    Physically abused: Not on file    Forced sexual activity: Not on file  Other Topics Concern  . Not on file  Social History Narrative  . Not on file     Current Outpatient Medications:  .  alprazolam (XANAX) 2 MG tablet, Take 2 mg by mouth 3 (three) times daily as needed. for anxiety, Disp: , Rfl: 5 .  Cascara Sagrada 450 MG CAPS, Take by mouth., Disp: , Rfl:  .  cyclobenzaprine (FLEXERIL) 10 MG tablet, Take 1 tablet (10 mg total) by mouth 2 (two) times daily., Disp: 30 tablet, Rfl: 1 .  diclofenac (VOLTAREN) 75 MG EC tablet, TAKE 1 TABLET BY MOUTH TWICE DAILY ALTERNATES  WITH  TYLENOL  ARTHRITIS, Disp: 60 tablet, Rfl: 1 .  losartan-hydrochlorothiazide (HYZAAR) 100-25 MG tablet, Take 1 tablet by mouth daily., Disp: 90 tablet, Rfl: 1 .  Omega-3 Fatty Acids (OMEGA 3 PO), Take by mouth. Omega XL - 2 capsules daily, Disp: , Rfl:  .  pyridoxine (B-6) 100 MG tablet, Take 100 mg by mouth daily., Disp: , Rfl:  .  sildenafil (VIAGRA)  100 MG tablet, Take 1 tablet (100 mg total) by mouth daily as needed. Use as instructed, Disp: 5 tablet, Rfl: 11 .  Specialty Vitamins Products (CENTRUM PERFORMANCE) TABS, Take by mouth.  , Disp: , Rfl:  .  tiZANidine (ZANAFLEX) 2 MG tablet, Take 1 tablet (2 mg total) by mouth every 8 (eight) hours as needed for muscle spasms., Disp: 30 tablet, Rfl: 1 .  vitamin B-12 (CYANOCOBALAMIN) 1000 MCG tablet, Take 1,000 mcg by mouth daily.  , Disp: , Rfl:   No Known Allergies   ROS  Constitutional: Negative for fever or weight change.  Respiratory: Negative for cough and shortness of breath.   Cardiovascular: Negative for chest pain or palpitations.  Gastrointestinal: Negative for abdominal pain, no bowel changes.  Musculoskeletal: Negative for gait problem or joint swelling.  Skin: Negative for rash.  Neurological: Negative for dizziness or headache.  No other specific complaints in a complete review of systems (except as listed in HPI above).   Objective  Vitals:   06/24/18 1057  BP: 130/80  Pulse: 71  SpO2: 99%  Weight: 153 lb 12 oz (69.7 kg)  Height: 5\' 10"  (1.778 m)    Body mass index is 22.06 kg/m.  Physical Exam Vital signs reviewed. Constitutional: Patient appears well-developed and well-nourished. No distress.  HENT: Head: Normocephalic and atraumatic. Nose: Nose normal. Mouth/Throat: Oropharynx is clear and moist. No oropharyngeal exudate.  Eyes: Conjunctivae and EOM are normal. Pupils are equal, round, and reactive to light. No scleral icterus.  Neck: Normal range of motion. Neck supple.  Cardiovascular: Normal rate, regular rhythm and normal heart sounds.  No murmur heard. No BLE edema. Distal pulses intact. Pulmonary/Chest: Effort normal and breath sounds normal. No respiratory distress. Abdominal: Soft. No distension. Musculoskeletal: Normal range of motion, No gross deformities Neurological: he is alert and oriented to person, place, and time. No cranial nerve  deficit. Coordination, balance, strength, speech and gait are normal.  Skin: Skin is warm and dry. No rash noted. No erythema.  Psychiatric: Patient has a normal mood and affect. behavior is normal. Judgment and thought content normal.  Assessment & Plan RTC in 6 months for F/U: knee pain, back pain- update CMET, CBC for diclofenac A1c done on 11/12/17-5.2

## 2018-06-24 NOTE — Assessment & Plan Note (Signed)
-  Discussed importance of regiular exercise, healthy diet, and drink plenty of water and avoid sweet beverages.  -discussed annual health maintenance and prevention recommendations and screenings, healthy living at home- additional information printed on AVS  -Reviewed Health Maintenance: Need for influenza vaccination- Flu vaccine HIGH DOSE PF  Routine general medical examination at a health care facility - Lipid panel; Future - CBC; Future - Comprehensive metabolic panel; Future

## 2018-06-24 NOTE — Assessment & Plan Note (Signed)
Discussed long term risks associated with use of nsaids including MI, stroke, bleeding but he would ike to continue prn, does appear to be taking on prn basis only according to medication refills CBC, CMET updated today RTC in 6 months for follow up, labs, or sooner for new, worsening symptoms - diclofenac (VOLTAREN) 75 MG EC tablet; TAKE 1 TABLET BY MOUTH TWICE DAILY ALTERNATES  WITH  TYLENOL  ARTHRITIS  Dispense: 60 tablet; Refill: 1

## 2018-06-24 NOTE — Patient Instructions (Addendum)
Please head downstairs for lab work. If any of your test results are critically abnormal, you will be contacted right away. Otherwise, I will contact you within a week about your test results and follow up recommendations  I will plan to see you back in 6 months for routine follow up of chronic conditions.   Health Maintenance, Male A healthy lifestyle and preventive care is important for your health and wellness. Ask your health care provider about what schedule of regular examinations is right for you. What should I know about weight and diet? Eat a Healthy Diet  Eat plenty of vegetables, fruits, whole grains, low-fat dairy products, and lean protein.  Do not eat a lot of foods high in solid fats, added sugars, or salt.  Maintain a Healthy Weight Regular exercise can help you achieve or maintain a healthy weight. You should:  Do at least 150 minutes of exercise each week. The exercise should increase your heart rate and make you sweat (moderate-intensity exercise).  Do strength-training exercises at least twice a week.  Watch Your Levels of Cholesterol and Blood Lipids  Have your blood tested for lipids and cholesterol every 5 years starting at 68 years of age. If you are at high risk for heart disease, you should start having your blood tested when you are 68 years old. You may need to have your cholesterol levels checked more often if: ? Your lipid or cholesterol levels are high. ? You are older than 68 years of age. ? You are at high risk for heart disease.  What should I know about cancer screening? Many types of cancers can be detected early and may often be prevented. Lung Cancer  You should be screened every year for lung cancer if: ? You are a current smoker who has smoked for at least 30 years. ? You are a former smoker who has quit within the past 15 years.  Talk to your health care provider about your screening options, when you should start screening, and how often  you should be screened.  Colorectal Cancer  Routine colorectal cancer screening usually begins at 68 years of age and should be repeated every 5-10 years until you are 68 years old. You may need to be screened more often if early forms of precancerous polyps or small growths are found. Your health care provider may recommend screening at an earlier age if you have risk factors for colon cancer.  Your health care provider may recommend using home test kits to check for hidden blood in the stool.  A small camera at the end of a tube can be used to examine your colon (sigmoidoscopy or colonoscopy). This checks for the earliest forms of colorectal cancer.  Prostate and Testicular Cancer  Depending on your age and overall health, your health care provider may do certain tests to screen for prostate and testicular cancer.  Talk to your health care provider about any symptoms or concerns you have about testicular or prostate cancer.  Skin Cancer  Check your skin from head to toe regularly.  Tell your health care provider about any new moles or changes in moles, especially if: ? There is a change in a mole's size, shape, or color. ? You have a mole that is larger than a pencil eraser.  Always use sunscreen. Apply sunscreen liberally and repeat throughout the day.  Protect yourself by wearing long sleeves, pants, a wide-brimmed hat, and sunglasses when outside.  What should I know about heart  disease, diabetes, and high blood pressure?  If you are 86-83 years of age, have your blood pressure checked every 3-5 years. If you are 46 years of age or older, have your blood pressure checked every year. You should have your blood pressure measured twice-once when you are at a hospital or clinic, and once when you are not at a hospital or clinic. Record the average of the two measurements. To check your blood pressure when you are not at a hospital or clinic, you can use: ? An automated blood pressure  machine at a pharmacy. ? A home blood pressure monitor.  Talk to your health care provider about your target blood pressure.  If you are between 45-16 years old, ask your health care provider if you should take aspirin to prevent heart disease.  Have regular diabetes screenings by checking your fasting blood sugar level. ? If you are at a normal weight and have a low risk for diabetes, have this test once every three years after the age of 87. ? If you are overweight and have a high risk for diabetes, consider being tested at a younger age or more often.  A one-time screening for abdominal aortic aneurysm (AAA) by ultrasound is recommended for men aged 65-75 years who are current or former smokers. What should I know about preventing infection? Hepatitis B If you have a higher risk for hepatitis B, you should be screened for this virus. Talk with your health care provider to find out if you are at risk for hepatitis B infection. Hepatitis C Blood testing is recommended for:  Everyone born from [birth year] through 1966.  Anyone with known risk factors for hepatitis C.  Sexually Transmitted Diseases (STDs)  You should be screened each year for STDs including gonorrhea and chlamydia if: ? You are sexually active and are younger than 67 years of age. ? You are older than 68 years of age and your health care provider tells you that you are at risk for this type of infection. ? Your sexual activity has changed since you were last screened and you are at an increased risk for chlamydia or gonorrhea. Ask your health care provider if you are at risk.  Talk with your health care provider about whether you are at high risk of being infected with HIV. Your health care provider may recommend a prescription medicine to help prevent HIV infection.  What else can I do?  Schedule regular health, dental, and eye exams.  Stay current with your vaccines (immunizations).  Do not use any tobacco products,  such as cigarettes, chewing tobacco, and e-cigarettes. If you need help quitting, ask your health care provider.  Limit alcohol intake to no more than 2 drinks per day. One drink equals 12 ounces of beer, 5 ounces of wine, or 1 ounces of hard liquor.  Do not use street drugs.  Do not share needles.  Ask your health care provider for help if you need support or information about quitting drugs.  Tell your health care provider if you often feel depressed.  Tell your health care provider if you have ever been abused or do not feel safe at home. This information is not intended to replace advice given to you by your health care provider. Make sure you discuss any questions you have with your health care provider. Document Released: 03/27/2008 Document Revised: 05/28/2016 Document Reviewed: 07/03/2015 Elsevier Interactive Patient Education  [employer].

## 2018-06-24 NOTE — Telephone Encounter (Signed)
Called patient and LVM in response to a message from scheduling that the patient would like for nurse to call him. Provided direct contact number for patient to call nurse back and will call patient again at a later date.

## 2018-06-24 NOTE — Assessment & Plan Note (Signed)
Update labs He is fasting - Lipid panel; Future

## 2018-06-24 NOTE — Assessment & Plan Note (Signed)
Stable Continue current meds - losartan-hydrochlorothiazide (HYZAAR) 100-25 MG tablet; Take 1 tablet by mouth daily.  Dispense: 90 tablet; Refill: 1

## 2018-07-03 ENCOUNTER — Encounter: Payer: Self-pay | Admitting: *Deleted

## 2018-07-03 DIAGNOSIS — F419 Anxiety disorder, unspecified: Secondary | ICD-10-CM

## 2018-07-21 ENCOUNTER — Encounter: Payer: Self-pay | Admitting: Psychiatry

## 2018-07-21 ENCOUNTER — Ambulatory Visit (INDEPENDENT_AMBULATORY_CARE_PROVIDER_SITE_OTHER): Payer: Medicare HMO | Admitting: Psychiatry

## 2018-07-21 VITALS — BP 125/83 | HR 68 | Ht 71.0 in

## 2018-07-21 DIAGNOSIS — F411 Generalized anxiety disorder: Secondary | ICD-10-CM

## 2018-07-21 MED ORDER — ALPRAZOLAM 2 MG PO TABS: 2.0000 mg | ORAL_TABLET | Freq: Three times a day (TID) | ORAL | 5 refills | Status: DC | PRN

## 2018-07-21 NOTE — Progress Notes (Signed)
Argil Mahl [id number] [birth year] 67 y.o.  Subjective:   Patient ID:  Carl Trujillo is a 67 y.o. (DOB [birth year]) male.  Chief Complaint:  Chief Complaint  Patient presents with  . Follow-up    HPI Carl Trujillo presents to the office today for follow-up of anxiety. "I've been good." Reports that he has been trying to manage anxiety by "trying to take things as they come." Reports that he typically takes 1/2 tabs of Xanax throughout the day. Reports that he was able to take a vacation this summer and stay 2 weeks with his niece in [place]. Denies depressed mood. Reports that he continues to try to maintain a healthy weight. Appetite is good. Reports that he tries to eat a healthy diet. He reports that he sleeps well. He reports that his energy and motivation is good and reports that he is able to be as active as younger family members. Denies impaired concentration. Denies SI or HI.   Medications: I have reviewed the patient's current medications.  Allergies: No Known Allergies  Past Medical History:  Diagnosis Date  . Anxiety   . Arthritis of right knee   . Arthritis of spine   . ED (erectile dysfunction)   . Hypertension     No past surgical history on file.  Family History  Problem Relation Age of Onset  . Cancer Other   . Coronary artery disease Other   . CVA Sister   . Cancer Mother   . Post-traumatic stress disorder Brother     Social History   Socioeconomic History  . Marital status: Single    Spouse name: Not on file  . Number of children: Not on file  . Years of education: Not on file  . Highest education level: Not on file  Occupational History  . Occupation: retired  Engineer, production  . Financial resource strain: Not hard at all  . Food insecurity:    Worry: Never true    Inability: Never true  . Transportation needs:    Medical: No    Non-medical: No  Tobacco Use  . Smoking status: Former Smoker    Years: 23.00    Types: Cigars    Last attempt to  quit: 10/13/1997    Years since quitting: 20.7  . Smokeless tobacco: Never Used  . Tobacco comment: current smoker of cigars in the pm - stopped cigarettes 9yrs ago  Substance and Sexual Activity  . Alcohol use: Yes    Alcohol/week: 3.0 standard drinks    Types: 3 Glasses of wine per week  . Drug use: No  . Sexual activity: Not Currently  Lifestyle  . Physical activity:    Days per week: 5 days    Minutes per session: 50 min  . Stress: Not at all  Relationships  . Social connections:    Talks on phone: More than three times a week    Gets together: More than three times a week    Attends religious service: More than 4 times per year    Active member of club or organization: Yes    Attends meetings of clubs or organizations: More than 4 times per year    Relationship status: Not on file  . Intimate partner violence:    Fear of current or ex partner: Not on file    Emotionally abused: Not on file    Physically abused: Not on file    Forced sexual activity: Not on file  Other Topics Concern  .  Not on file  Social History Narrative  . Not on file    Past Medical History, Surgical history, Social history, and Family history were reviewed and updated as appropriate.   Please see review of systems for further details on the patient's review from today.   Review of Systems:  Review of Systems  HENT: Negative.   Respiratory: Negative.   Musculoskeletal: Negative for back pain and gait problem.       Reports improved knee pain.  Allergic/Immunologic: Negative.   Neurological: Negative for tremors.  Psychiatric/Behavioral: Negative for dysphoric mood, sleep disturbance and suicidal ideas.    Objective:   Physical Exam:  BP 125/83   Pulse 68   Ht 5\' 11"  (1.803 m)   BMI 21.44 kg/m   Physical Exam  Constitutional: He is oriented to person, place, and time. He appears well-developed. No distress.  Musculoskeletal: Normal range of motion.  Neurological: He is alert and  oriented to person, place, and time. Coordination normal.  Psychiatric: He has a normal mood and affect. His speech is normal and behavior is normal. Judgment and thought content normal. His mood appears not anxious. His affect is not angry, not blunt, not labile and not inappropriate. Cognition and memory are normal. He does not exhibit a depressed mood. He expresses no homicidal and no suicidal ideation. He expresses no suicidal plans and no homicidal plans.    Lab Review:     Component Value Date/Time   NA 139 06/24/2018 1159   K 3.6 06/24/2018 1159   CL 101 06/24/2018 1159   CO2 30 06/24/2018 1159   GLUCOSE 102 (H) 06/24/2018 1159   BUN 11 06/24/2018 1159   CREATININE 1.00 06/24/2018 1159   CALCIUM 9.3 06/24/2018 1159   PROT 7.2 06/24/2018 1159   ALBUMIN 4.1 06/24/2018 1159   AST 20 06/24/2018 1159   ALT 20 06/24/2018 1159   ALKPHOS 86 06/24/2018 1159   BILITOT 1.2 06/24/2018 1159   GFRNONAA 88.07 11/14/2009 1036       Component Value Date/Time   WBC 5.3 06/24/2018 1159   RBC 4.59 06/24/2018 1159   HGB 14.5 06/24/2018 1159   HCT 41.5 06/24/2018 1159   PLT 311.0 06/24/2018 1159   MCV 90.4 06/24/2018 1159   MCHC 34.8 06/24/2018 1159   RDW 13.6 06/24/2018 1159   LYMPHSABS 2.0 02/19/2011 0809   MONOABS 0.5 02/19/2011 0809   EOSABS 0.5 02/19/2011 0809   BASOSABS 0.0 02/19/2011 0809     Assessment: Plan:   Patient seen for 30 minutes and greater than 50% of exam spent counseling patient to include continuing self-care and activities to lower stress to help manage anxiety overall.  Discussed continuing to take the lowest possible effective dose of Xanax.  Informed patient that his current prescription of Xanax will expire later this month and that an additional prescription will be sent to his pharmacy.  Patient advised to contact office if he experiences any worsening signs and symptoms.  Patient follow-up in 6 months or sooner if clinically indicated. Anxiety state - Plan:  alprazolam Prudy Feeler) 2 MG tablet  Please see After Visit Summary for patient specific instructions.  Future Appointments  Date Time Provider Department Center  09/14/2018  9:30 AM Waymon Budge, MD [facility] None  01/20/2019 11:30 AM Corie Chiquito, PMHNP [facility] None    No orders of the defined types were placed in this encounter.     -------------------------------

## 2018-08-13 ENCOUNTER — Other Ambulatory Visit: Payer: Self-pay | Admitting: Nurse Practitioner

## 2018-08-13 DIAGNOSIS — G8929 Other chronic pain: Secondary | ICD-10-CM

## 2018-08-13 DIAGNOSIS — M5442 Lumbago with sciatica, left side: Principal | ICD-10-CM

## 2018-09-14 ENCOUNTER — Ambulatory Visit: Payer: Self-pay | Admitting: Internal Medicine

## 2018-11-17 ENCOUNTER — Ambulatory Visit (INDEPENDENT_AMBULATORY_CARE_PROVIDER_SITE_OTHER): Payer: Medicare HMO | Admitting: Nurse Practitioner

## 2018-11-17 ENCOUNTER — Encounter: Payer: Self-pay | Admitting: Nurse Practitioner

## 2018-11-17 ENCOUNTER — Other Ambulatory Visit (INDEPENDENT_AMBULATORY_CARE_PROVIDER_SITE_OTHER): Payer: Medicare HMO

## 2018-11-17 VITALS — BP 132/80 | HR 72 | Ht 71.0 in | Wt 156.0 lb

## 2018-11-17 DIAGNOSIS — G8929 Other chronic pain: Secondary | ICD-10-CM

## 2018-11-17 DIAGNOSIS — M25561 Pain in right knee: Secondary | ICD-10-CM | POA: Diagnosis not present

## 2018-11-17 DIAGNOSIS — Z79899 Other long term (current) drug therapy: Secondary | ICD-10-CM | POA: Diagnosis not present

## 2018-11-17 DIAGNOSIS — M25562 Pain in left knee: Secondary | ICD-10-CM

## 2018-11-17 DIAGNOSIS — I10 Essential (primary) hypertension: Secondary | ICD-10-CM | POA: Diagnosis not present

## 2018-11-17 DIAGNOSIS — M5442 Lumbago with sciatica, left side: Secondary | ICD-10-CM | POA: Diagnosis not present

## 2018-11-17 LAB — COMPREHENSIVE METABOLIC PANEL
ALT: 19 U/L (ref 0–53)
AST: 19 U/L (ref 0–37)
Albumin: 4.3 g/dL (ref 3.5–5.2)
Alkaline Phosphatase: 86 U/L (ref 39–117)
BUN: 14 mg/dL (ref 6–23)
CALCIUM: 9.2 mg/dL (ref 8.4–10.5)
CHLORIDE: 101 meq/L (ref 96–112)
CO2: 29 meq/L (ref 19–32)
CREATININE: 0.96 mg/dL (ref 0.40–1.50)
GFR: 94.21 mL/min (ref >60.00)
Glucose, Bld: 87 mg/dL (ref 70–99)
POTASSIUM: 4.2 meq/L (ref 3.5–5.1)
SODIUM: 139 meq/L (ref 135–145)
Total Bilirubin: 0.8 mg/dL (ref 0.2–1.2)
Total Protein: 6.8 g/dL (ref 6.0–8.3)

## 2018-11-17 LAB — CBC
HCT: 42 % (ref 39.0–52.0)
HEMOGLOBIN: 14.3 g/dL (ref 13.0–17.0)
MCHC: 34.1 g/dL (ref 30.0–36.0)
MCV: 91.7 fl (ref 78.0–100.0)
Platelets: 261 10*3/uL (ref 150.0–400.0)
RBC: 4.58 Mil/uL (ref 4.22–5.81)
RDW: 13 % (ref 11.5–15.5)
WBC: 4.9 10*3/uL (ref 4.0–10.5)

## 2018-11-17 MED ORDER — LOSARTAN POTASSIUM-HCTZ 100-25 MG PO TABS: 1.0000 | ORAL_TABLET | Freq: Every day | ORAL | 1 refills | Status: DC

## 2018-11-17 MED ORDER — CYCLOBENZAPRINE HCL 10 MG PO TABS: 10.0000 mg | ORAL_TABLET | Freq: Two times a day (BID) | ORAL | 1 refills | Status: DC

## 2018-11-17 MED ORDER — DICLOFENAC SODIUM 75 MG PO TBEC: DELAYED_RELEASE_TABLET | ORAL | 1 refills | Status: DC

## 2018-11-17 NOTE — Assessment & Plan Note (Signed)
Stable Continue diclofenac, flexeril prn Update labs F/u for new, worsening symptoms - CBC; Future - Comprehensive metabolic panel; Future - diclofenac (VOLTAREN) 75 MG EC tablet; TAKE 1 TABLET BY MOUTH TWICE DAILY ALTERNATES  WITH  TYLENOL  ARTHRITIS  Dispense: 60 tablet; Refill: 1

## 2018-11-17 NOTE — Assessment & Plan Note (Signed)
Stable Continue diclofenac, flexeril prn Update labs F/u for new, worsening symptoms - CBC; Future - Comprehensive metabolic panel; Future - cyclobenzaprine (FLEXERIL) 10 MG tablet; Take 1 tablet (10 mg total) by mouth 2 (two) times daily. As needed  Dispense: 30 tablet; Refill: 1

## 2018-11-17 NOTE — Assessment & Plan Note (Signed)
Stable Continue current medication Continue to monitor - losartan-hydrochlorothiazide (HYZAAR) 100-25 MG tablet; Take 1 tablet by mouth daily.  Dispense: 90 tablet; Refill: 1

## 2018-11-17 NOTE — Progress Notes (Signed)
Carl JarvisJames Trujillo is a 68 y.o. male with the following history as recorded in [employer]:  Patient Active Problem List   Diagnosis Date Noted  . Anxiety 07/03/2018  . Routine general medical examination at a health care facility 06/24/2018  . History of tobacco use 09/18/2017  . MIXED HYPERLIPIDEMIA 08/22/2010  . LOW BACK PAIN, CHRONIC 11/21/2009  . Knee pain 07/11/2009  . Generalized anxiety disorder 01/31/2009  . Back pain 07/13/2008  . Obstructive sleep apnea 02/04/2008  . ERECTILE DYSFUNCTION, MILD 06/29/2007  . ARTHRITIS, SPINE 06/29/2007  . Essential hypertension 06/28/2007    Current Outpatient Medications  Medication Sig Dispense Refill  . alprazolam (XANAX) 2 MG tablet Take 1 tablet (2 mg total) by mouth 3 (three) times daily as needed for sleep. 80 tablet 5  . aspirin 81 MG tablet Take 81 mg by mouth daily.    Thomasene Mohair. [place] 450 MG CAPS Take by mouth.    . cholecalciferol (VITAMIN D) 1000 units tablet Take 1,000 Units by mouth daily.    [name]. Cod Liver Oil 1000 MG CAPS Take by mouth.    . cyclobenzaprine (FLEXERIL) 10 MG tablet Take 1 tablet (10 mg total) by mouth 2 (two) times daily. As needed 30 tablet 1  . diclofenac (VOLTAREN) 75 MG EC tablet TAKE 1 TABLET BY MOUTH TWICE DAILY ALTERNATES  WITH  TYLENOL  ARTHRITIS 60 tablet 1  . losartan-hydrochlorothiazide (HYZAAR) 100-25 MG tablet Take 1 tablet by mouth daily. 90 tablet 1  . Magnesium-Zinc 133.33-5 MG TABS Take by mouth.    . Omega-3 Fatty Acids (OMEGA 3 PO) Take by mouth. Omega XL - 2 capsules daily    . pyridoxine (B-6) 100 MG tablet Take 100 mg by mouth daily.    . sildenafil (VIAGRA) 100 MG tablet Take 1 tablet (100 mg total) by mouth daily as needed. Use as instructed 5 tablet 11  . Specialty Vitamins Products (CENTRUM PERFORMANCE) TABS Take by mouth.      . vitamin B-12 (CYANOCOBALAMIN) 1000 MCG tablet Take 1,000 mcg by mouth daily.       No current facility-administered medications for this visit.     Allergies:  Patient has no known allergies.  Past Medical History:  Diagnosis Date  . Anxiety   . Arthritis of right knee   . Arthritis of spine   . ED (erectile dysfunction)   . Hypertension     History reviewed. No pertinent surgical history.  Family History  Problem Relation Age of Onset  . Cancer Other   . Coronary artery disease Other   . CVA Sister   . Cancer Mother   . Post-traumatic stress disorder Brother     Social History   Tobacco Use  . Smoking status: Former Smoker    Years: 23.00    Types: Cigars    Last attempt to quit: 10/13/1997    Years since quitting: 21.1  . Smokeless tobacco: Never Used  . Tobacco comment: current smoker of cigars in the pm - stopped cigarettes 7438yrs ago  Substance Use Topics  . Alcohol use: Yes    Alcohol/week: 3.0 standard drinks    Types: 3 Glasses of wine per week     Subjective:  Carl Trujillo is here today for routine follow up of back and knee pain, also requesting refill of BP medication.  He is maintained on voltaren, flexeril PRN for chronic knee and back pain, takes voltaren several times per week to once daily and flexeril as needed, several  times per week, has not noted any adverse medication effects and reports good control of knee and back pain with medications. He says he feels well with no complaints today. No weakness, syncope, headaches, vision changes, chest pain, shortness of breath, edema.  BP Readings from Last 3 Encounters:  11/17/18 132/80  06/24/18 130/80  03/17/18 120/80    ROS- See HPI  Objective:  Vitals:   11/17/18 1302  BP: 132/80  Pulse: 72  SpO2: 95%  Weight: 156 lb (70.8 kg)  Height: 5\' 11"  (1.803 m)    General: Well developed, well nourished, in no acute distress  Skin : Warm and dry.  Head: Normocephalic and atraumatic  Eyes: Sclera and conjunctiva clear; pupils round and reactive to light; extraocular movements intact  Oropharynx: Pink, supple. No suspicious lesions  Neck: Supple without  thyromegaly, adenopathy  Lungs: Respirations unlabored; clear to auscultation bilaterally without wheeze, rales, rhonchi  CVS exam: Normal rate and regular rhythm, S1 and S2 normal.  Musculoskeletal: No deformities; no active joint inflammation; normal ROM  Extremities: No edema, cyanosis, clubbing  Vessels: Symmetric bilaterally  Neurologic: Alert and oriented; speech intact; face symmetrical; moves all extremities well; CNII-XII intact without focal deficit  Psychiatric: Normal mood and affect.  Assessment:  1. High risk medication use   2. Chronic pain of both knees   3. Chronic bilateral low back pain with left-sided sciatica   4. Essential hypertension     Plan:   High risk medication use Update labs for monitoring - CBC; Future - Comprehensive metabolic panel; Future   Return in about 7 months (around 06/18/2019) for CPE.  Orders Placed This Encounter  Procedures  . CBC    Standing Status:   Future    Number of Occurrences:   1    Standing Expiration Date:   11/18/2019  . Comprehensive metabolic panel    Standing Status:   Future    Number of Occurrences:   1    Standing Expiration Date:   11/18/2019    Requested Prescriptions   Signed Prescriptions Disp Refills  . cyclobenzaprine (FLEXERIL) 10 MG tablet 30 tablet 1    Sig: Take 1 tablet (10 mg total) by mouth 2 (two) times daily. As needed  . losartan-hydrochlorothiazide (HYZAAR) 100-25 MG tablet 90 tablet 1    Sig: Take 1 tablet by mouth daily.  . diclofenac (VOLTAREN) 75 MG EC tablet 60 tablet 1    Sig: TAKE 1 TABLET BY MOUTH TWICE DAILY ALTERNATES  WITH  TYLENOL  ARTHRITIS

## 2018-11-17 NOTE — Patient Instructions (Signed)
Head downstairs for lab work  Your refills have been sent

## 2018-12-07 DIAGNOSIS — N402 Nodular prostate without lower urinary tract symptoms: Secondary | ICD-10-CM | POA: Diagnosis not present

## 2018-12-07 DIAGNOSIS — N401 Enlarged prostate with lower urinary tract symptoms: Secondary | ICD-10-CM | POA: Diagnosis not present

## 2018-12-07 DIAGNOSIS — N5201 Erectile dysfunction due to arterial insufficiency: Secondary | ICD-10-CM | POA: Diagnosis not present

## 2018-12-07 DIAGNOSIS — N400 Enlarged prostate without lower urinary tract symptoms: Secondary | ICD-10-CM | POA: Diagnosis not present

## 2018-12-27 ENCOUNTER — Other Ambulatory Visit: Payer: Self-pay | Admitting: *Deleted

## 2018-12-27 DIAGNOSIS — G8929 Other chronic pain: Secondary | ICD-10-CM

## 2018-12-27 DIAGNOSIS — M5442 Lumbago with sciatica, left side: Principal | ICD-10-CM

## 2018-12-27 MED ORDER — CYCLOBENZAPRINE HCL 10 MG PO TABS: 10.0000 mg | ORAL_TABLET | Freq: Two times a day (BID) | ORAL | 1 refills | Status: DC

## 2019-01-20 ENCOUNTER — Ambulatory Visit: Payer: Medicare HMO | Admitting: Psychiatry

## 2019-01-28 ENCOUNTER — Other Ambulatory Visit: Payer: Self-pay | Admitting: Nurse Practitioner

## 2019-01-28 ENCOUNTER — Other Ambulatory Visit: Payer: Self-pay | Admitting: Psychiatry

## 2019-01-28 DIAGNOSIS — I10 Essential (primary) hypertension: Secondary | ICD-10-CM

## 2019-01-28 DIAGNOSIS — M5442 Lumbago with sciatica, left side: Secondary | ICD-10-CM

## 2019-01-28 DIAGNOSIS — M25561 Pain in right knee: Secondary | ICD-10-CM

## 2019-01-28 DIAGNOSIS — F411 Generalized anxiety disorder: Secondary | ICD-10-CM

## 2019-01-28 DIAGNOSIS — M25562 Pain in left knee: Secondary | ICD-10-CM

## 2019-01-28 DIAGNOSIS — G8929 Other chronic pain: Secondary | ICD-10-CM

## 2019-01-28 MED ORDER — CYCLOBENZAPRINE HCL 10 MG PO TABS: 10.0000 mg | ORAL_TABLET | Freq: Two times a day (BID) | ORAL | 1 refills | Status: DC

## 2019-01-28 MED ORDER — LOSARTAN POTASSIUM-HCTZ 100-25 MG PO TABS: 1.0000 | ORAL_TABLET | Freq: Every day | ORAL | 0 refills | Status: DC

## 2019-01-28 MED ORDER — DICLOFENAC SODIUM 75 MG PO TBEC: DELAYED_RELEASE_TABLET | ORAL | 1 refills | Status: DC

## 2019-01-28 NOTE — Telephone Encounter (Signed)
Requested medication (s) are due for refill today: cyclobenzaprine and diclofenac are due                                                                                 Losartan-HCTZ: not due  Requested medication (s) are on the active medication list: yes  Last refill:  Cyclobenzaprine: 12/27/18                   Diclofenac: 11/17/18                   Losartan-HCTZ: 11/17/18  Future visit scheduled: no  Notes to clinic:  Pt needs TOC appt PCP not at practice   Requested Prescriptions  Pending Prescriptions Disp Refills   losartan-hydrochlorothiazide (HYZAAR) 100-25 MG tablet 90 tablet 1    Sig: Take 1 tablet by mouth daily.     Cardiovascular: ARB + Diuretic Combos Passed - 01/28/2019 11:24 AM      Passed - K in normal range and within 180 days    Potassium  Date Value Ref Range Status  11/17/2018 4.2 3.5 - 5.1 mEq/L Final         Passed - Na in normal range and within 180 days    Sodium  Date Value Ref Range Status  11/17/2018 139 135 - 145 mEq/L Final         Passed - Cr in normal range and within 180 days    Creatinine, Ser  Date Value Ref Range Status  11/17/2018 0.96 0.40 - 1.50 mg/dL Final         Passed - Ca in normal range and within 180 days    Calcium  Date Value Ref Range Status  11/17/2018 9.2 8.4 - 10.5 mg/dL Final         Passed - Patient is not pregnant      Passed - Last BP in normal range    BP Readings from Last 1 Encounters:  11/17/18 132/80         Passed - Valid encounter within last 6 months    Recent Outpatient Visits          2 months ago High risk medication use   [facility] -Nila Nephew, Audie Box, NP   7 months ago Routine general medical examination at a health care facility   [facility] -Nila Nephew, Audie Box, NP   10 months ago Chronic bilateral low back pain with left-sided sciatica   [facility] -Nila Nephew, Audie Box, NP   1 year ago Essential hypertension    [facility] -Nila Nephew, Audie Box, NP   1 year ago Essential hypertension   [facility] -Darnelle Catalan, Tama Headings, FNP            cyclobenzaprine (FLEXERIL) 10 MG tablet 30 tablet 1    Sig: Take 1 tablet (10 mg total) by mouth 2 (two) times daily. As needed     Not Delegated - Analgesics:  Muscle Relaxants Failed - 01/28/2019 11:24 AM      Failed - This refill cannot be delegated      Passed -  Valid encounter within last 6 months    Recent Outpatient Visits          2 months ago [facility] risk medication use   [facility] Cam[facility]y, Audie BoxAshleigh N, NP   7 months ago Routine general medical examination at a health care facility   [facility] -[facility] Nephew[facility]lam Shambley, Audie BoxAshleigh N, NP   10 months ago Chronic bilateral low back pain with left-sided sciatica   [facility] -[facility] Shambley, Audie BoxAshleigh N, NP   1 year ago Essential hypertension   [facility] Shambley, Audie BoxAshleigh N, NP   1 year ago Essential hypertension   [facility] -Darnelle CatalanElam Calone, Tama HeadingsGregory D, FNP            diclofenac (VOLTAREN) 75 MG EC tablet 60 tablet 1    Sig: TAKE 1 TABLET BY MOUTH TWICE DAILY ALTERNATES  WITH  TYLENOL  ARTHRITIS     Analgesics:  NSAIDS Passed - 01/28/2019 11:24 AM      Passed - Cr in normal range and within 360 days    Creatinine, Ser  Date Value Ref Range Status  11/17/2018 0.96 0.40 - 1.50 mg/dL Final         Passed - HGB in normal range and within 360 days    Hemoglobin  Date Value Ref Range Status  11/17/2018 14.3 13.0 - 17.0 g/dL Final         Passed - Patient is not pregnant      Passed - Valid encounter within last 12 months    Recent Outpatient Visits          2 months ago High risk medication use   [facility], Audie BoxAshleigh N, NP   7 months ago Routine general medical examination at a health care facility    [facility]hew[facility]lam Shambley, Audie BoxAshleigh N, NP   10 months ago Chronic bilateral low back pain with left-sided sciatica   [facility] -[facility]lam Shambley, Audie BoxAshleigh N, NP   1 year ago Essential hypertension   [facility] -[facility] Aura CampsShambley, Audie BoxAshleigh N, NP   1 year ago Essential hypertension   [facility] -Olive BassElam Calone, Gregory D, FNP

## 2019-01-29 ENCOUNTER — Other Ambulatory Visit: Payer: Self-pay | Admitting: Psychiatry

## 2019-01-29 DIAGNOSIS — F411 Generalized anxiety disorder: Secondary | ICD-10-CM

## 2019-02-14 ENCOUNTER — Encounter: Payer: Self-pay | Admitting: Psychiatry

## 2019-02-14 ENCOUNTER — Other Ambulatory Visit: Payer: Self-pay

## 2019-02-14 ENCOUNTER — Ambulatory Visit (INDEPENDENT_AMBULATORY_CARE_PROVIDER_SITE_OTHER): Payer: Medicare HMO | Admitting: Psychiatry

## 2019-02-14 DIAGNOSIS — F411 Generalized anxiety disorder: Secondary | ICD-10-CM

## 2019-02-14 MED ORDER — ALPRAZOLAM 2 MG PO TABS: 1.0000 mg | ORAL_TABLET | Freq: Three times a day (TID) | ORAL | 5 refills | Status: DC | PRN

## 2019-02-14 MED ORDER — ALPRAZOLAM 2 MG PO TABS: ORAL_TABLET | ORAL | 5 refills | Status: DC

## 2019-02-14 NOTE — Progress Notes (Signed)
Wong Steadham [id number] 07-Feb-1951 68 y.o.  Virtual Visit via Telephone Note  I connected with@ on 02/14/19 at 11:00 AM EDT by telephone and verified that I am speaking with the correct person using two identifiers.   I discussed the limitations, risks, security and privacy concerns of performing an evaluation and management service by telephone and the availability of in person appointments. I also discussed with the patient that there may be a patient responsible charge related to this service. The patient expressed understanding and agreed to proceed.   I discussed the assessment and treatment plan with the patient. The patient was provided an opportunity to ask questions and all were answered. The patient agreed with the plan and demonstrated an understanding of the instructions.   The patient was advised to call back or seek an in-person evaluation if the symptoms worsen or if the condition fails to improve as anticipated.  I provided 15 minutes of non-face-to-face time during this encounter.  The patient was located at home.  The provider was located at home.   Corie Chiquito, PMHNP   Subjective:   Patient ID:  Carl Trujillo is a 68 y.o. (DOB 1951-01-29) male.  Chief Complaint:  Chief Complaint  Patient presents with  . Follow-up    h/o Anxiety    HPI Carl Trujillo presents for follow-up of anxiety. He reports that he has been taking strict precautions to avoid exposure to COVID. He reports slight increase in anxiety. Denies depressed mood. He reports that his sleep has been adequate. Energy and motivation have been good. He reports that he notices some occ forgetfulness. Denies impaired concentration. Reports that his appetite and weight has been stable. Denies SI.  Relative died over the weekend.    Review of Systems:  Review of Systems  Musculoskeletal: Negative for gait problem.  Allergic/Immunologic: Positive for environmental allergies.  Neurological: Negative for  tremors.  Psychiatric/Behavioral:       Please refer to HPI    Medications: I have reviewed the patient's current medications.  Current Outpatient Medications  Medication Sig Dispense Refill  . [START ON 02/26/2019] alprazolam (XANAX) 2 MG tablet Take 0.5 tablets (1 mg total) by mouth 3 (three) times daily as needed for up to 30 days for sleep. 80 tablet 5  . aspirin 81 MG tablet Take 81 mg by mouth daily.    [name] 450 MG CAPS Take by mouth.    . cholecalciferol (VITAMIN D) 1000 units tablet Take 1,000 Units by mouth daily.    [name] Cod Liver Oil 1000 MG CAPS Take by mouth.    . cyclobenzaprine (FLEXERIL) 10 MG tablet Take 1 tablet (10 mg total) by mouth 2 (two) times daily. As needed 30 tablet 1  . diclofenac (VOLTAREN) 75 MG EC tablet TAKE 1 TABLET BY MOUTH TWICE DAILY ALTERNATES  WITH  TYLENOL  ARTHRITIS 60 tablet 1  . losartan-hydrochlorothiazide (HYZAAR) 100-25 MG tablet Take 1 tablet by mouth daily. 90 tablet 0  . Magnesium-Zinc 133.33-5 MG TABS Take by mouth.    . Omega-3 Fatty Acids (OMEGA 3 PO) Take by mouth. Omega XL - 2 capsules daily    . pyridoxine (B-6) 100 MG tablet Take 100 mg by mouth daily.    . sildenafil (VIAGRA) 100 MG tablet Take 1 tablet (100 mg total) by mouth daily as needed. Use as instructed 5 tablet 11  . Specialty Vitamins Products (CENTRUM PERFORMANCE) TABS Take by mouth.      . vitamin B-12 (CYANOCOBALAMIN) 1000  MCG tablet Take 1,000 mcg by mouth daily.      [name] alprazolam (XANAX) 2 MG tablet Take 1 tablet (2 mg total) by mouth 3 (three) times daily as needed for anxiety. 90 tablet 5   No current facility-administered medications for this visit.     Medication Side Effects: None  Allergies: No Known Allergies  Past Medical History:  Diagnosis Date  . Anxiety   . Arthritis of right knee   . Arthritis of spine   . ED (erectile dysfunction)   . Hypertension     Family History  Problem Relation Age of Onset  . Cancer Other   . Coronary  artery disease Other   . CVA Sister   . Cancer Mother   . Post-traumatic stress disorder Brother     Social History   Socioeconomic History  . Marital status: Single    Spouse name: Not on file  . Number of children: Not on file  . Years of education: Not on file  . Highest education level: Not on file  Occupational History  . Occupation: retired  Engineer, production  . Financial resource strain: Not hard at all  . Food insecurity:    Worry: Never true    Inability: Never true  . Transportation needs:    Medical: No    Non-medical: No  Tobacco Use  . Smoking status: Former Smoker    Years: 23.00    Types: Cigars    Last attempt to quit: 10/13/1997    Years since quitting: 21.3  . Smokeless tobacco: Never Used  . Tobacco comment: current smoker of cigars in the pm - stopped cigarettes 47yrs ago  Substance and Sexual Activity  . Alcohol use: Yes    Alcohol/week: 3.0 standard drinks    Types: 3 Glasses of wine per week  . Drug use: No  . Sexual activity: Not Currently  Lifestyle  . Physical activity:    Days per week: 5 days    Minutes per session: 50 min  . Stress: Not at all  Relationships  . Social connections:    Talks on phone: More than three times a week    Gets together: More than three times a week    Attends religious service: More than 4 times per year    Active member of club or organization: Yes    Attends meetings of clubs or organizations: More than 4 times per year    Relationship status: Not on file  . Intimate partner violence:    Fear of current or ex partner: Not on file    Emotionally abused: Not on file    Physically abused: Not on file    Forced sexual activity: Not on file  Other Topics Concern  . Not on file  Social History Narrative  . Not on file    Past Medical History, Surgical history, Social history, and Family history were reviewed and updated as appropriate.   Please see review of systems for further details on the patient's review  from today.   Objective:   Physical Exam:  There were no vitals taken for this visit.  Physical Exam Neurological:     Mental Status: He is alert and oriented to person, place, and time.     Cranial Nerves: No dysarthria.  Psychiatric:        Attention and Perception: Attention normal.        Mood and Affect: Mood normal.        Speech:  Speech normal.        Behavior: Behavior is cooperative.        Thought Content: Thought content normal. Thought content is not paranoid or delusional. Thought content does not include homicidal or suicidal ideation. Thought content does not include homicidal or suicidal plan.        Cognition and Memory: Cognition and memory normal.        Judgment: Judgment normal.     Lab Review:     Component Value Date/Time   NA 139 11/17/2018 1324   K 4.2 11/17/2018 1324   CL 101 11/17/2018 1324   CO2 29 11/17/2018 1324   GLUCOSE 87 11/17/2018 1324   BUN 14 11/17/2018 1324   CREATININE 0.96 11/17/2018 1324   CALCIUM 9.2 11/17/2018 1324   PROT 6.8 11/17/2018 1324   ALBUMIN 4.3 11/17/2018 1324   AST 19 11/17/2018 1324   ALT 19 11/17/2018 1324   ALKPHOS 86 11/17/2018 1324   BILITOT 0.8 11/17/2018 1324   GFRNONAA 88.07 11/14/2009 1036       Component Value Date/Time   WBC 4.9 11/17/2018 1324   RBC 4.58 11/17/2018 1324   HGB 14.3 11/17/2018 1324   HCT 42.0 11/17/2018 1324   PLT 261.0 11/17/2018 1324   MCV 91.7 11/17/2018 1324   MCHC 34.1 11/17/2018 1324   RDW 13.0 11/17/2018 1324   LYMPHSABS 2.0 02/19/2011 0809   MONOABS 0.5 02/19/2011 0809   EOSABS 0.5 02/19/2011 0809   BASOSABS 0.0 02/19/2011 0809    No results found for: POCLITH, LITHIUM   No results found for: PHENYTOIN, PHENOBARB, VALPROATE, CBMZ   .res Assessment: Plan:   Will continue Xanax 2 mg 1/2-1 tab po 3 times daily as needed for anxiety and insomnia since anxiety remains well controlled.  Patient to follow-up in 6 months or sooner if clinically indicated. Patient  advised to contact office with any questions, adverse effects, or acute worsening in signs and symptoms.  Anxiety state - Plan: alprazolam Prudy Feeler(XANAX) 2 MG tablet  Please see After Visit Summary for patient specific instructions.  No future appointments.  No orders of the defined types were placed in this encounter.     -------------------------------

## 2019-06-27 ENCOUNTER — Encounter: Payer: Self-pay | Admitting: Nurse Practitioner

## 2019-08-17 ENCOUNTER — Other Ambulatory Visit: Payer: Self-pay

## 2019-08-17 ENCOUNTER — Encounter: Payer: Self-pay | Admitting: Psychiatry

## 2019-08-17 ENCOUNTER — Ambulatory Visit (INDEPENDENT_AMBULATORY_CARE_PROVIDER_SITE_OTHER): Payer: Medicare HMO | Admitting: Psychiatry

## 2019-08-17 DIAGNOSIS — F411 Generalized anxiety disorder: Secondary | ICD-10-CM

## 2019-08-17 MED ORDER — ALPRAZOLAM 2 MG PO TABS: ORAL_TABLET | ORAL | 5 refills | Status: DC

## 2019-08-17 NOTE — Progress Notes (Signed)
Versie Trujillo [id number] 1951/02/18 68 y.o.  Subjective:   Patient ID:  Carl Trujillo is a 68 y.o. (DOB [birth year]) male.  Chief Complaint:  Chief Complaint  Patient presents with  . Follow-up    Anxiety    HPI Carl Trujillo presents to the office today for follow-up of anxiety. He reports that he has been spending the majority of his time at home since the start of the pandemic. He reports that he tracks current events frequently on his phone, taking care of himself, or taking care of his home. He reports that he is able to tolerate being by himself without difficulty. He reports that his anxiety and stress has been manageable and he chooses to avoid people and situations that would trigger his anxiety. He reports that he is being cautious and cleaning his surroundings. Denies sad mood. Sleeping well. He reports that his appetite has been good. Energy and motivation have been good. Denies concentration impairment. Denies SI.   He reports that his family has been healthy overall. He reports that his faith has been helpful with coping with stressors.   Review of Systems:  Review of Systems  HENT: Positive for congestion.   Musculoskeletal: Negative for gait problem.  Neurological: Negative for tremors.  Psychiatric/Behavioral:       Please refer to HPI    Medications: I have reviewed the patient's current medications.  Current Outpatient Medications  Medication Sig Dispense Refill  . [START ON 08/24/2019] alprazolam (XANAX) 2 MG tablet Take 1/2-1 tab po TID prn anxiety and insomnia 80 tablet 5  . Ascorbic Acid (VITAMIN C) 1000 MG tablet Take 1,000 mg by mouth daily.    [name] aspirin 81 MG tablet Take 81 mg by mouth daily.    [name] 450 MG CAPS Take by mouth.    . cholecalciferol (VITAMIN D) 1000 units tablet Take 1,000 Units by mouth daily.    [name] Cod Liver Oil 1000 MG CAPS Take by mouth.    . diclofenac (VOLTAREN) 75 MG EC tablet TAKE 1 TABLET BY MOUTH TWICE DAILY ALTERNATES   WITH  TYLENOL  ARTHRITIS 60 tablet 1  . losartan-hydrochlorothiazide (HYZAAR) 100-25 MG tablet Take 1 tablet by mouth daily. 90 tablet 0  . Magnesium-Zinc 133.33-5 MG TABS Take by mouth.    . Omega-3 Fatty Acids (OMEGA 3 PO) Take by mouth. Omega XL - 2 capsules daily    . pyridoxine (B-6) 100 MG tablet Take 100 mg by mouth daily.    [name] Specialty Vitamins Products (CENTRUM PERFORMANCE) TABS Take by mouth.      . vitamin B-12 (CYANOCOBALAMIN) 1000 MCG tablet Take 1,000 mcg by mouth daily.      . cyclobenzaprine (FLEXERIL) 10 MG tablet Take 1 tablet (10 mg total) by mouth 2 (two) times daily. As needed (Patient not taking: Reported on 08/17/2019) 30 tablet 1  . sildenafil (VIAGRA) 100 MG tablet Take 1 tablet (100 mg total) by mouth daily as needed. Use as instructed 5 tablet 11   No current facility-administered medications for this visit.     Medication Side Effects: None  Allergies: No Known Allergies  Past Medical History:  Diagnosis Date  . Anxiety   . Arthritis of right knee   . Arthritis of spine   . ED (erectile dysfunction)   . Hypertension     Family History  Problem Relation Age of Onset  . Cancer Other   . Coronary artery disease Other   . CVA Sister   .  Cancer Mother   . Post-traumatic stress disorder Brother     Social History   Socioeconomic History  . Marital status: Single    Spouse name: Not on file  . Number of children: Not on file  . Years of education: Not on file  . Highest education level: Not on file  Occupational History  . Occupation: retired  Engineer, production  . Financial resource strain: Not hard at all  . Food insecurity    Worry: Never true    Inability: Never true  . Transportation needs    Medical: No    Non-medical: No  Tobacco Use  . Smoking status: Former Smoker    Years: 23.00    Types: Cigars    Quit date: 10/13/1997    Years since quitting: 21.8  . Smokeless tobacco: Never Used  . Tobacco comment: current smoker of cigars in the  pm - stopped cigarettes 45yrs ago  Substance and Sexual Activity  . Alcohol use: Yes    Alcohol/week: 3.0 standard drinks    Types: 3 Glasses of wine per week  . Drug use: No  . Sexual activity: Not Currently  Lifestyle  . Physical activity    Days per week: 5 days    Minutes per session: 50 min  . Stress: Not at all  Relationships  . Social connections    Talks on phone: More than three times a week    Gets together: More than three times a week    Attends religious service: More than 4 times per year    Active member of club or organization: Yes    Attends meetings of clubs or organizations: More than 4 times per year    Relationship status: Not on file  . Intimate partner violence    Fear of current or ex partner: Not on file    Emotionally abused: Not on file    Physically abused: Not on file    Forced sexual activity: Not on file  Other Topics Concern  . Not on file  Social History Narrative  . Not on file    Past Medical History, Surgical history, Social history, and Family history were reviewed and updated as appropriate.   Please see review of systems for further details on the patient's review from today.   Objective:   Physical Exam:  Wt 152 lb (68.9 kg)   BMI 21.20 kg/m   Physical Exam Constitutional:      General: He is not in acute distress.    Appearance: He is well-developed.  Musculoskeletal:        General: No deformity.  Neurological:     Mental Status: He is alert and oriented to person, place, and time.     Coordination: Coordination normal.  Psychiatric:        Attention and Perception: Attention and perception normal. He does not perceive auditory or visual hallucinations.        Mood and Affect: Mood normal. Mood is not anxious or depressed. Affect is not labile, blunt, angry or inappropriate.        Speech: Speech normal.        Behavior: Behavior normal.        Thought Content: Thought content normal. Thought content does not include  homicidal or suicidal ideation. Thought content does not include homicidal or suicidal plan.        Cognition and Memory: Cognition and memory normal.        Judgment: Judgment normal.  Comments: Insight intact. No delusions.      Lab Review:     Component Value Date/Time   NA 139 11/17/2018 1324   K 4.2 11/17/2018 1324   CL 101 11/17/2018 1324   CO2 29 11/17/2018 1324   GLUCOSE 87 11/17/2018 1324   BUN 14 11/17/2018 1324   CREATININE 0.96 11/17/2018 1324   CALCIUM 9.2 11/17/2018 1324   PROT 6.8 11/17/2018 1324   ALBUMIN 4.3 11/17/2018 1324   AST 19 11/17/2018 1324   ALT 19 11/17/2018 1324   ALKPHOS 86 11/17/2018 1324   BILITOT 0.8 11/17/2018 1324   GFRNONAA 88.07 11/14/2009 1036       Component Value Date/Time   WBC 4.9 11/17/2018 1324   RBC 4.58 11/17/2018 1324   HGB 14.3 11/17/2018 1324   HCT 42.0 11/17/2018 1324   PLT 261.0 11/17/2018 1324   MCV 91.7 11/17/2018 1324   MCHC 34.1 11/17/2018 1324   RDW 13.0 11/17/2018 1324   LYMPHSABS 2.0 02/19/2011 0809   MONOABS 0.5 02/19/2011 0809   EOSABS 0.5 02/19/2011 0809   BASOSABS 0.0 02/19/2011 0809    No results found for: POCLITH, LITHIUM   No results found for: PHENYTOIN, PHENOBARB, VALPROATE, CBMZ   .res Assessment: Plan:   Will continue Xanax 2 mg 1/2-1 tab po TID prn anxiety and insomnia since pt reports s/s are well controlled without any significant tolerability issues.  Pt to f/u in 6 months or sooner if clinically indicated.  Patient advised to contact office with any questions, adverse effects, or acute worsening in signs and symptoms.  [name] was seen today for follow-up.  Diagnoses and all orders for this visit:  Anxiety state -     alprazolam (XANAX) 2 MG tablet; Take 1/2-1 tab po TID prn anxiety and insomnia     Please see After Visit Summary for patient specific instructions.  Future Appointments  Date Time Provider Department Center  02/15/2020 11:30 AM Corie Chiquitoarter, Yann Biehn, PMHNP [facility] None     No orders of the defined types were placed in this encounter.   -------------------------------

## 2019-12-09 ENCOUNTER — Ambulatory Visit: Payer: Medicare HMO | Attending: Internal Medicine

## 2019-12-09 DIAGNOSIS — Z23 Encounter for immunization: Secondary | ICD-10-CM

## 2019-12-09 NOTE — Progress Notes (Signed)
   Covid-19 Vaccination Clinic  Name:  Carl Trujillo    MRN: [id number] DOB: 1951-08-05  12/09/2019  [name] was observed post Covid-19 immunization for 15 minutes without incidence. He was provided with Vaccine Information Sheet and instruction to access the V-Safe system.   [name] was instructed to call 911 with any severe reactions post vaccine: [name] Difficulty breathing  . Swelling of your face and throat  . A fast heartbeat  . A bad rash all over your body  . Dizziness and weakness    Immunizations Administered    Name Date Dose VIS Date Route   Pfizer COVID-19 Vaccine 12/09/2019 12:49 PM 0.3 mL 09/23/2019 Intramuscular   Manufacturer: [employer], [employer]   Lot: [id number]   NDC: [address]-9

## 2019-12-16 ENCOUNTER — Telehealth: Payer: Self-pay

## 2019-12-16 NOTE — Telephone Encounter (Signed)
New message   The patient calling seen Dr. Aura Camps in the past wanted to know who will be his new PCP.   Will Dr. Jonny Ruiz take him on as a new patient

## 2019-12-17 NOTE — Telephone Encounter (Signed)
Ok with me 

## 2019-12-19 NOTE — Telephone Encounter (Signed)
LVM for patient to call back and set up an appointment.  

## 2019-12-21 ENCOUNTER — Other Ambulatory Visit: Payer: Self-pay

## 2019-12-21 ENCOUNTER — Encounter: Payer: Self-pay | Admitting: Internal Medicine

## 2019-12-21 ENCOUNTER — Ambulatory Visit (INDEPENDENT_AMBULATORY_CARE_PROVIDER_SITE_OTHER): Payer: Medicare HMO | Admitting: Internal Medicine

## 2019-12-21 VITALS — BP 164/82 | HR 70 | Temp 98.0°F | Ht 71.0 in | Wt 159.0 lb

## 2019-12-21 DIAGNOSIS — Z0000 Encounter for general adult medical examination without abnormal findings: Secondary | ICD-10-CM | POA: Diagnosis not present

## 2019-12-21 DIAGNOSIS — I10 Essential (primary) hypertension: Secondary | ICD-10-CM | POA: Diagnosis not present

## 2019-12-21 DIAGNOSIS — G8929 Other chronic pain: Secondary | ICD-10-CM

## 2019-12-21 DIAGNOSIS — E559 Vitamin D deficiency, unspecified: Secondary | ICD-10-CM

## 2019-12-21 DIAGNOSIS — M5442 Lumbago with sciatica, left side: Secondary | ICD-10-CM | POA: Diagnosis not present

## 2019-12-21 DIAGNOSIS — E538 Deficiency of other specified B group vitamins: Secondary | ICD-10-CM | POA: Diagnosis not present

## 2019-12-21 DIAGNOSIS — F411 Generalized anxiety disorder: Secondary | ICD-10-CM | POA: Diagnosis not present

## 2019-12-21 DIAGNOSIS — M25561 Pain in right knee: Secondary | ICD-10-CM

## 2019-12-21 DIAGNOSIS — M25562 Pain in left knee: Secondary | ICD-10-CM

## 2019-12-21 MED ORDER — DICLOFENAC SODIUM 75 MG PO TBEC: DELAYED_RELEASE_TABLET | ORAL | 5 refills | Status: DC

## 2019-12-21 MED ORDER — CYCLOBENZAPRINE HCL 10 MG PO TABS: 10.0000 mg | ORAL_TABLET | Freq: Two times a day (BID) | ORAL | 2 refills | Status: DC

## 2019-12-21 MED ORDER — LOSARTAN POTASSIUM-HCTZ 100-25 MG PO TABS: 1.0000 | ORAL_TABLET | Freq: Every day | ORAL | 3 refills | Status: DC

## 2019-12-21 NOTE — Assessment & Plan Note (Signed)
To restart med, f/u Bp at home and next visit 3 mo

## 2019-12-21 NOTE — Assessment & Plan Note (Signed)
Followed by psychiatry 

## 2019-12-21 NOTE — Progress Notes (Signed)
Subjective:    Patient ID: Carl Trujillo, male    DOB: 08-13-1951, 69 y.o.   MRN: [id number]  HPI  Here for wellness and f/u;  Overall doing ok;  Pt denies Chest pain, worsening SOB, DOE, wheezing, orthopnea, PND, worsening LE edema, palpitations, dizziness or syncope.  Pt denies neurological change such as new headache, facial or extremity weakness.  Pt denies polydipsia, polyuria, or low sugar symptoms. Pt states overall good compliance with treatment and medications, good tolerability, and has been trying to follow appropriate diet.  Pt denies worsening depressive symptoms, suicidal ideation or panic. No fever, night sweats, wt loss, loss of appetite, or other constitutional symptoms.  Pt states good ability with ADL's, has low fall risk, home safety reviewed and adequate, no other significant changes in hearing or vision, and only occasionally active with exercise. Regained a few lbs with the pendemic.Out of BP meds for several wks.  Has ongoing recurrent right knee OA pain but no giveaways or falls.  .   Past Medical History:  Diagnosis Date  . Anxiety   . Arthritis of right knee   . Arthritis of spine   . ED (erectile dysfunction)   . Hypertension    No past surgical history on file.  reports that he quit smoking about 22 years ago. His smoking use included cigars. He quit after 23.00 years of use. He has never used smokeless tobacco. He reports current alcohol use of about 3.0 standard drinks of alcohol per week. He reports that he does not use drugs. family history includes CVA in his sister; Cancer in his mother and another family member; Coronary artery disease in an other family member; Post-traumatic stress disorder in his brother. No Known Allergies Current Outpatient Medications on File Prior to Visit  Medication Sig Dispense Refill  . alprazolam (XANAX) 2 MG tablet Take 1/2-1 tab po TID prn anxiety and insomnia 80 tablet 5  . Ascorbic Acid (VITAMIN C) 1000 MG tablet Take 1,000 mg  by mouth daily.    [name] aspirin 81 MG tablet Take 81 mg by mouth daily.    [name] 450 MG CAPS Take by mouth.    . cholecalciferol (VITAMIN D) 1000 units tablet Take 1,000 Units by mouth daily.    [name] Cod Liver Oil 1000 MG CAPS Take by mouth.    . Magnesium-Zinc 133.33-5 MG TABS Take by mouth.    . Omega-3 Fatty Acids (OMEGA 3 PO) Take by mouth. Omega XL - 2 capsules daily    . pyridoxine (B-6) 100 MG tablet Take 100 mg by mouth daily.    . sildenafil (VIAGRA) 100 MG tablet Take 1 tablet (100 mg total) by mouth daily as needed. Use as instructed 5 tablet 11  . Specialty Vitamins Products (CENTRUM PERFORMANCE) TABS Take by mouth.      . vitamin B-12 (CYANOCOBALAMIN) 1000 MCG tablet Take 1,000 mcg by mouth daily.       No current facility-administered medications on file prior to visit.   Review of Systems All otherwise neg per pt     Objective:   Physical Exam BP (!) 164/82   Pulse 70   Temp 98 F (36.7 C)   Ht 5\' 11"  (1.803 m)   Wt 159 lb (72.1 kg)   SpO2 99%   BMI 22.18 kg/m  VS noted,  Constitutional: Pt appears in NAD HENT: Head: NCAT.  Right Ear: External ear normal.  Left Ear: External ear normal.  Eyes: . Pupils  are equal, round, and reactive to light. Conjunctivae and EOM are normal Nose: without d/c or deformity Neck: Neck supple. Gross normal ROM Cardiovascular: Normal rate and regular rhythm.   Pulmonary/Chest: Effort normal and breath sounds without rales or wheezing.  Abd:  Soft, NT, ND, + BS, no organomegaly Neurological: Pt is alert. At baseline orientation, motor grossly intact Skin: Skin is warm. No rashes, other new lesions, no LE edema Psychiatric: Pt behavior is normal without agitation  All otherwise neg per pt Lab Results  Component Value Date   WBC 4.5 12/22/2019   HGB 15.3 12/22/2019   HCT 44.6 12/22/2019   PLT 232.0 12/22/2019   GLUCOSE 111 (H) 12/22/2019   CHOL 176 12/22/2019   TRIG 70.0 12/22/2019   HDL 74.80 12/22/2019   LDLCALC 87  12/22/2019   ALT 19 12/22/2019   AST 21 12/22/2019   NA 140 12/22/2019   K 4.2 12/22/2019   CL 100 12/22/2019   CREATININE 1.01 12/22/2019   BUN 17 12/22/2019   CO2 34 (H) 12/22/2019   TSH 5.31 (H) 12/22/2019   PSA 1.14 12/22/2019   HGBA1C 5.2 11/12/2017      Assessment & Plan:

## 2019-12-22 ENCOUNTER — Encounter: Payer: Self-pay | Admitting: Internal Medicine

## 2019-12-22 ENCOUNTER — Other Ambulatory Visit (INDEPENDENT_AMBULATORY_CARE_PROVIDER_SITE_OTHER): Payer: Medicare HMO

## 2019-12-22 DIAGNOSIS — E559 Vitamin D deficiency, unspecified: Secondary | ICD-10-CM | POA: Diagnosis not present

## 2019-12-22 DIAGNOSIS — E538 Deficiency of other specified B group vitamins: Secondary | ICD-10-CM | POA: Diagnosis not present

## 2019-12-22 DIAGNOSIS — Z0000 Encounter for general adult medical examination without abnormal findings: Secondary | ICD-10-CM | POA: Diagnosis not present

## 2019-12-22 LAB — URINALYSIS, ROUTINE W REFLEX MICROSCOPIC
Bilirubin Urine: NEGATIVE
Hgb urine dipstick: NEGATIVE
Ketones, ur: NEGATIVE
Leukocytes,Ua: NEGATIVE
Nitrite: NEGATIVE
RBC / HPF: NONE SEEN (ref 0–?)
Specific Gravity, Urine: 1.025 (ref 1.000–1.030)
Total Protein, Urine: NEGATIVE
Urine Glucose: NEGATIVE
Urobilinogen, UA: 0.2 (ref 0.0–1.0)
WBC, UA: NONE SEEN (ref 0–?)
pH: 6 (ref 5.0–8.0)

## 2019-12-22 LAB — LIPID PANEL
Cholesterol: 176 mg/dL (ref 0–200)
HDL: 74.8 mg/dL (ref >39.00)
LDL Cholesterol: 87 mg/dL (ref 0–99)
NonHDL: 101.11
Total CHOL/HDL Ratio: 2
Triglycerides: 70 mg/dL (ref 0.0–149.0)
VLDL: 14 mg/dL (ref 0.0–40.0)

## 2019-12-22 LAB — BASIC METABOLIC PANEL
BUN: 17 mg/dL (ref 6–23)
CO2: 34 mEq/L — ABNORMAL HIGH (ref 19–32)
Calcium: 9.8 mg/dL (ref 8.4–10.5)
Chloride: 100 mEq/L (ref 96–112)
Creatinine, Ser: 1.01 mg/dL (ref 0.40–1.50)
GFR: 88.56 mL/min (ref >60.00)
Glucose, Bld: 111 mg/dL — ABNORMAL HIGH (ref 70–99)
Potassium: 4.2 mEq/L (ref 3.5–5.1)
Sodium: 140 mEq/L (ref 135–145)

## 2019-12-22 LAB — CBC WITH DIFFERENTIAL/PLATELET
Basophils Absolute: 0 10*3/uL (ref 0.0–0.1)
Basophils Relative: 0.3 % (ref 0.0–3.0)
Eosinophils Absolute: 0.4 10*3/uL (ref 0.0–0.7)
Eosinophils Relative: 9.1 % — ABNORMAL HIGH (ref 0.0–5.0)
HCT: 44.6 % (ref 39.0–52.0)
Hemoglobin: 15.3 g/dL (ref 13.0–17.0)
Lymphocytes Relative: 24.8 % (ref 12.0–46.0)
Lymphs Abs: 1.1 10*3/uL (ref 0.7–4.0)
MCHC: 34.3 g/dL (ref 30.0–36.0)
MCV: 93.4 fl (ref 78.0–100.0)
Monocytes Absolute: 0.6 10*3/uL (ref 0.1–1.0)
Monocytes Relative: 12.8 % — ABNORMAL HIGH (ref 3.0–12.0)
Neutro Abs: 2.4 10*3/uL (ref 1.4–7.7)
Neutrophils Relative %: 53 % (ref 43.0–77.0)
Platelets: 232 10*3/uL (ref 150.0–400.0)
RBC: 4.77 Mil/uL (ref 4.22–5.81)
RDW: 13.3 % (ref 11.5–15.5)
WBC: 4.5 10*3/uL (ref 4.0–10.5)

## 2019-12-22 LAB — HEPATIC FUNCTION PANEL
ALT: 19 U/L (ref 0–53)
AST: 21 U/L (ref 0–37)
Albumin: 4.2 g/dL (ref 3.5–5.2)
Alkaline Phosphatase: 89 U/L (ref 39–117)
Bilirubin, Direct: 0.2 mg/dL (ref 0.0–0.3)
Total Bilirubin: 0.9 mg/dL (ref 0.2–1.2)
Total Protein: 6.8 g/dL (ref 6.0–8.3)

## 2019-12-22 LAB — VITAMIN D 25 HYDROXY (VIT D DEFICIENCY, FRACTURES): VITD: 35.27 ng/mL (ref 30.00–100.00)

## 2019-12-22 LAB — VITAMIN B12: Vitamin B-12: 822 pg/mL (ref 211–911)

## 2019-12-22 LAB — PSA: PSA: 1.14 ng/mL (ref 0.10–4.00)

## 2019-12-22 LAB — TSH: TSH: 5.31 u[IU]/mL — ABNORMAL HIGH (ref 0.35–4.50)

## 2019-12-25 NOTE — Assessment & Plan Note (Signed)

## 2019-12-25 NOTE — Assessment & Plan Note (Signed)
Chronic stable, cont same tx 

## 2019-12-25 NOTE — Patient Instructions (Signed)
Please continue all other medications as before, and refills have been done if requested.  Please have the pharmacy call with any other refills you may need.  Please continue your efforts at being more active, low cholesterol diet, and weight control.  You are otherwise up to date with prevention measures today.  Please keep your appointments with your specialists as you may have planned  Please make an Appointment to return in 3 months,

## 2019-12-25 NOTE — Assessment & Plan Note (Signed)
stable overall by history and exam, recent data reviewed with pt, and pt to continue medical treatment as before,  to f/u any worsening symptoms or concerns  

## 2020-01-03 ENCOUNTER — Ambulatory Visit: Payer: Medicare HMO | Attending: Internal Medicine

## 2020-01-03 DIAGNOSIS — Z23 Encounter for immunization: Secondary | ICD-10-CM

## 2020-01-03 NOTE — Progress Notes (Signed)
   Covid-19 Vaccination Clinic  Name:  Carl Trujillo    MRN: [id number] DOB: [birth year]  01/03/2020  Mr. [name] was observed post Covid-19 immunization for 15 minutes without incident. He was provided with Vaccine Information Sheet and instruction to access the V-Safe system.   Mr. [name] was instructed to call 911 with any severe reactions post vaccine: [name] Difficulty breathing  . Swelling of face and throat  . A fast heartbeat  . A bad rash all over body  . Dizziness and weakness   Immunizations Administered    Name Date Dose VIS Date Route   Pfizer COVID-19 Vaccine 01/03/2020  3:38 PM 0.3 mL 09/23/2019 Intramuscular   Manufacturer: [employer], [employer]   Lot: [id number]   NDC: [address]-0

## 2020-02-15 ENCOUNTER — Ambulatory Visit (INDEPENDENT_AMBULATORY_CARE_PROVIDER_SITE_OTHER): Payer: Medicare HMO | Admitting: Psychiatry

## 2020-02-15 ENCOUNTER — Other Ambulatory Visit: Payer: Self-pay

## 2020-02-15 ENCOUNTER — Encounter: Payer: Self-pay | Admitting: Psychiatry

## 2020-02-15 DIAGNOSIS — F411 Generalized anxiety disorder: Secondary | ICD-10-CM

## 2020-02-15 MED ORDER — ALPRAZOLAM 2 MG PO TABS: ORAL_TABLET | ORAL | 5 refills | Status: DC

## 2020-02-15 NOTE — Progress Notes (Signed)
Carl Trujillo [id number] 1950-12-26 69 y.o.  Subjective:   Patient ID:  Carl Trujillo is a 69 y.o. (DOB 11-03-1950) male.  Chief Complaint:  Chief Complaint  Patient presents with  . Follow-up    Anxiety    HPI Carl Trujillo presents to the office today for follow-up of anxiety. "My anxiety is good" and denies any recent exacerbation of anxiety. Mood has been stable. Sleeping well. Sleeps 10-11 hours a night. He reports that he gained 11 lbs during the pandemic. He reports that his energy and motivation have been good. Concentration has been adequate. Reports enjoying activities. Denies SI.   He reports that he has been staying home during the pandemic. He reports that he has been able to entertain himself. Periodically visits his brother.   Taking Xanax TID, typically 1/2 tab during the day and 1/2 or 1 tab at bedtime.     Mini-Mental     Clinical Support from 03/17/2018 in [place]  Total Score (max 30 points )  29    PHQ2-9     Office Visit from 12/21/2019 in [facility] [place] from 03/17/2018 in [place] Visit from 07/03/2017 in [place]  PHQ-2 Total Score  1  0  0  PHQ-9 Total Score  --  0  --       Review of Systems:  Review of Systems  Musculoskeletal: Positive for back pain. Negative for gait problem.  Neurological: Negative for tremors.  Psychiatric/Behavioral:       Please refer to HPI    Has had both COVID vaccinations.   Medications: I have reviewed the patient's current medications.  Current Outpatient Medications  Medication Sig Dispense Refill  . [START ON 02/20/2020] alprazolam (XANAX) 2 MG tablet Take 1/2-1 tab po TID prn anxiety and insomnia 80 tablet 5  . Ascorbic Acid (VITAMIN C) 1000 MG tablet Take 1,000 mg by mouth daily.    [name] aspirin 81 MG tablet Take 81 mg by mouth daily.    Carl Trujillo 450 MG CAPS Take by mouth.    .  cholecalciferol (VITAMIN D) 1000 units tablet Take 1,000 Units by mouth daily.    [name] Cod Liver Oil 1000 MG CAPS Take by mouth.    . cyclobenzaprine (FLEXERIL) 10 MG tablet Take 1 tablet (10 mg total) by mouth 2 (two) times daily. As needed 60 tablet 2  . diclofenac (VOLTAREN) 75 MG EC tablet TAKE 1 TABLET BY MOUTH TWICE DAILY ALTERNATES  WITH  TYLENOL  ARTHRITIS 60 tablet 5  . losartan-hydrochlorothiazide (HYZAAR) 100-25 MG tablet Take 1 tablet by mouth daily. 90 tablet 3  . Magnesium-Zinc 133.33-5 MG TABS Take by mouth.    . Omega-3 Fatty Acids (OMEGA 3 PO) Take by mouth. Omega XL - 2 capsules daily    . pyridoxine (B-6) 100 MG tablet Take 100 mg by mouth daily.    . sildenafil (VIAGRA) 100 MG tablet Take 1 tablet (100 mg total) by mouth daily as needed. Use as instructed 5 tablet 11  . Specialty Vitamins Products (CENTRUM PERFORMANCE) TABS Take by mouth.      . vitamin B-12 (CYANOCOBALAMIN) 1000 MCG tablet Take 1,000 mcg by mouth daily.       No current facility-administered medications for this visit.    Medication Side Effects: None  Allergies: No Known Allergies  Past Medical History:  Diagnosis Date  . Anxiety   . Arthritis  of right knee   . Arthritis of spine   . ED (erectile dysfunction)   . Hypertension     Family History  Problem Relation Age of Onset  . Cancer Other   . Coronary artery disease Other   . CVA Sister   . Cancer Mother   . Post-traumatic stress disorder Brother     Social History   Socioeconomic History  . Marital status: Single    Spouse name: Not on file  . Number of children: Not on file  . Years of education: Not on file  . Highest education level: Not on file  Occupational History  . Occupation: retired  Tobacco Use  . Smoking status: Former Smoker    Years: 23.00    Types: Cigars    Quit date: 10/13/1997    Years since quitting: 22.3  . Smokeless tobacco: Never Used  . Tobacco comment: current smoker of cigars in the pm - stopped  cigarettes 36yrs ago  Substance and Sexual Activity  . Alcohol use: Yes    Alcohol/week: 3.0 standard drinks    Types: 3 Glasses of wine per week  . Drug use: No  . Sexual activity: Not Currently  Other Topics Concern  . Not on file  Social History Narrative  . Not on file   Social Determinants of Health   Financial Resource Strain:   . Difficulty of Paying Living Expenses:   Food Insecurity:   . Worried About Programme researcher, broadcasting/film/video in the Last Year:   . Barista in the Last Year:   Transportation Needs:   . Freight forwarder (Medical):   [name] Lack of Transportation (Non-Medical):   Physical Activity:   . Days of Exercise per Week:   . Minutes of Exercise per Session:   Stress:   . Feeling of Stress :   Social Connections:   . Frequency of Communication with Friends and Family:   . Frequency of Social Gatherings with Friends and Family:   . Attends Religious Services:   . Active Member of Clubs or Organizations:   . Attends Banker Meetings:   [name] Marital Status:   Intimate Partner Violence:   . Fear of Current or Ex-Partner:   . Emotionally Abused:   [name] Physically Abused:   . Sexually Abused:     Past Medical History, Surgical history, Social history, and Family history were reviewed and updated as appropriate.   Please see review of systems for further details on the patient's review from today.   Objective:   Physical Exam:  Wt 165 lb (74.8 kg)   BMI 23.01 kg/m   Physical Exam Constitutional:      General: He is not in acute distress. Musculoskeletal:        General: No deformity.  Neurological:     Mental Status: He is alert and oriented to person, place, and time.     Coordination: Coordination normal.  Psychiatric:        Attention and Perception: Attention and perception normal. He does not perceive auditory or visual hallucinations.        Mood and Affect: Mood normal. Mood is not anxious or depressed. Affect is not labile, blunt,  angry or inappropriate.        Speech: Speech normal.        Behavior: Behavior normal.        Thought Content: Thought content normal. Thought content is not paranoid or delusional. Thought content does  not include homicidal or suicidal ideation. Thought content does not include homicidal or suicidal plan.        Cognition and Memory: Cognition and memory normal.        Judgment: Judgment normal.     Comments: Insight intact     Lab Review:     Component Value Date/Time   NA 140 12/22/2019 0853   K 4.2 12/22/2019 0853   CL 100 12/22/2019 0853   CO2 34 (H) 12/22/2019 0853   GLUCOSE 111 (H) 12/22/2019 0853   BUN 17 12/22/2019 0853   CREATININE 1.01 12/22/2019 0853   CALCIUM 9.8 12/22/2019 0853   PROT 6.8 12/22/2019 0853   ALBUMIN 4.2 12/22/2019 0853   AST 21 12/22/2019 0853   ALT 19 12/22/2019 0853   ALKPHOS 89 12/22/2019 0853   BILITOT 0.9 12/22/2019 0853   GFRNONAA 88.07 11/14/2009 1036       Component Value Date/Time   WBC 4.5 12/22/2019 0853   RBC 4.77 12/22/2019 0853   HGB 15.3 12/22/2019 0853   HCT 44.6 12/22/2019 0853   PLT 232.0 12/22/2019 0853   MCV 93.4 12/22/2019 0853   MCHC 34.3 12/22/2019 0853   RDW 13.3 12/22/2019 0853   LYMPHSABS 1.1 12/22/2019 0853   MONOABS 0.6 12/22/2019 0853   EOSABS 0.4 12/22/2019 0853   BASOSABS 0.0 12/22/2019 0853    No results found for: POCLITH, LITHIUM   No results found for: PHENYTOIN, PHENOBARB, VALPROATE, CBMZ   .res Assessment: Plan:   Continue alprazolam as needed for anxiety and insomnia. Patient to follow-up in 6 months or sooner if clinically indicated. Patient advised to contact office with any questions, adverse effects, or acute worsening in signs and symptoms.  Deamonte was seen today for follow-up.  Diagnoses and all orders for this visit:  Anxiety state -     alprazolam (XANAX) 2 MG tablet; Take 1/2-1 tab po TID prn anxiety and insomnia     Please see After Visit Summary for patient specific  instructions.  Future Appointments  Date Time Provider Department Center  03/26/2020  2:20 PM Corwin Levins, MD [facility] None  08/17/2020 11:30 AM Corie Chiquito, PMHNP [facility] None    No orders of the defined types were placed in this encounter.   -------------------------------

## 2020-03-19 ENCOUNTER — Other Ambulatory Visit: Payer: Self-pay | Admitting: Internal Medicine

## 2020-03-19 DIAGNOSIS — G8929 Other chronic pain: Secondary | ICD-10-CM

## 2020-03-26 ENCOUNTER — Ambulatory Visit: Payer: Medicare HMO | Admitting: Internal Medicine

## 2020-03-28 ENCOUNTER — Ambulatory Visit (INDEPENDENT_AMBULATORY_CARE_PROVIDER_SITE_OTHER): Payer: Medicare HMO | Admitting: Internal Medicine

## 2020-03-28 ENCOUNTER — Other Ambulatory Visit: Payer: Self-pay

## 2020-03-28 ENCOUNTER — Encounter: Payer: Self-pay | Admitting: Internal Medicine

## 2020-03-28 ENCOUNTER — Ambulatory Visit (INDEPENDENT_AMBULATORY_CARE_PROVIDER_SITE_OTHER): Payer: Medicare HMO

## 2020-03-28 VITALS — BP 140/80 | HR 63 | Temp 98.1°F | Ht 71.0 in | Wt 162.0 lb

## 2020-03-28 VITALS — BP 140/80 | HR 63 | Temp 98.1°F | Resp 16 | Ht 71.0 in | Wt 162.8 lb

## 2020-03-28 DIAGNOSIS — I10 Essential (primary) hypertension: Secondary | ICD-10-CM

## 2020-03-28 DIAGNOSIS — Z0000 Encounter for general adult medical examination without abnormal findings: Secondary | ICD-10-CM

## 2020-03-28 DIAGNOSIS — E782 Mixed hyperlipidemia: Secondary | ICD-10-CM | POA: Diagnosis not present

## 2020-03-28 DIAGNOSIS — R739 Hyperglycemia, unspecified: Secondary | ICD-10-CM | POA: Diagnosis not present

## 2020-03-28 NOTE — Assessment & Plan Note (Addendum)
stable overall by history and exam, recent data reviewed with pt, and pt to continue medical treatment as before,  to f/u any worsening symptoms or concerns  I spent 31 minutes in preparing to see the patient by review of recent labs, imaging and procedures, obtaining and reviewing separately obtained history, communicating with the patient and family or caregiver, ordering medications, tests or procedures, and documenting clinical information in the EHR including the differential Dx, treatment, and any further evaluation and other management of htn, hyperglycemia, hld 

## 2020-03-28 NOTE — Assessment & Plan Note (Signed)
stable overall by history and exam, recent data reviewed with pt, and pt to continue medical treatment as before,  to f/u any worsening symptoms or concerns  

## 2020-03-28 NOTE — Patient Instructions (Signed)
Carl Trujillo , Thank you for taking time to come for your Medicare Wellness Visit. I appreciate your ongoing commitment to your health goals. Please review the following plan we discussed and let me know if I can assist you in the future.   Screening recommendations/referrals: Colonoscopy: last done 02/25/2018; due 02/2028 Recommended yearly ophthalmology/optometry visit for glaucoma screening and checkup Recommended yearly dental visit for hygiene and checkup  Vaccinations: Influenza vaccine: 06/24/2018; overdue; due every year Pneumococcal vaccine: completed Tdap vaccine: due 12/20/2020 Shingles vaccine: completed Covid-19: completed  Advanced directives: Please bring a copy of your health care power of attorney and living will to the office at your convenience.  Conditions/risks identified: Please continue to do your personal lifestyle choices by: daily care of teeth and gums, regular physical activity (goal should be 5 days a week for 30 minutes), eat a healthy diet, avoid tobacco and drug use, limiting any alcohol intake, taking a low-dose aspirin (if not allergic or have been advised by your provider otherwise) and taking vitamins and minerals as recommended by your provider.   Next appointment: Please schedule your next Medicare Wellness Visit with your Nurse Health Advisor in 1 year.  Preventive Care 20 Years and Older, Male Preventive care refers to lifestyle choices and visits with your health care provider that can promote health and wellness. What does preventive care include?  A yearly physical exam. This is also called an annual well check.  Dental exams once or twice a year.  Routine eye exams. Ask your health care provider how often you should have your eyes checked.  Personal lifestyle choices, including:  Daily care of your teeth and gums.  Regular physical activity.  Eating a healthy diet.  Avoiding tobacco and drug use.  Limiting alcohol use.  Practicing safe  sex.  Taking low doses of aspirin every day.  Taking vitamin and mineral supplements as recommended by your health care provider. What happens during an annual well check? The services and screenings done by your health care provider during your annual well check will depend on your age, overall health, lifestyle risk factors, and family history of disease. Counseling  Your health care provider may ask you questions about your:  Alcohol use.  Tobacco use.  Drug use.  Emotional well-being.  Home and relationship well-being.  Sexual activity.  Eating habits.  History of falls.  Memory and ability to understand (cognition).  Work and work Astronomer. Screening  You may have the following tests or measurements:  Height, weight, and BMI.  Blood pressure.  Lipid and cholesterol levels. These may be checked every 5 years, or more frequently if you are over 31 years old.  Skin check.  Lung cancer screening. You may have this screening every year starting at age 34 if you have a 30-pack-year history of smoking and currently smoke or have quit within the past 15 years.  Fecal occult blood test (FOBT) of the stool. You may have this test every year starting at age 64.  Flexible sigmoidoscopy or colonoscopy. You may have a sigmoidoscopy every 5 years or a colonoscopy every 10 years starting at age 44.  Prostate cancer screening. Recommendations will vary depending on your family history and other risks.  Hepatitis C blood test.  Hepatitis B blood test.  Sexually transmitted disease (STD) testing.  Diabetes screening. This is done by checking your blood sugar (glucose) after you have not eaten for a while (fasting). You may have this done every 1-3 years.  Abdominal  aortic aneurysm (AAA) screening. You may need this if you are a current or former smoker.  Osteoporosis. You may be screened starting at age 82 if you are at high risk. Talk with your health care provider  about your test results, treatment options, and if necessary, the need for more tests. Vaccines  Your health care provider may recommend certain vaccines, such as:  Influenza vaccine. This is recommended every year.  Tetanus, diphtheria, and acellular pertussis (Tdap, Td) vaccine. You may need a Td booster every 10 years.  Zoster vaccine. You may need this after age 50.  Pneumococcal 13-valent conjugate (PCV13) vaccine. One dose is recommended after age 31.  Pneumococcal polysaccharide (PPSV23) vaccine. One dose is recommended after age 56. Talk to your health care provider about which screenings and vaccines you need and how often you need them. This information is not intended to replace advice given to you by your health care provider. Make sure you discuss any questions you have with your health care provider. Document Released: 10/26/2015 Document Revised: 06/18/2016 Document Reviewed: 07/31/2015 Elsevier Interactive Patient Education  2017 [employer].  Fall Prevention in the Home Falls can cause injuries. They can happen to people of all ages. There are many things you can do to make your home safe and to help prevent falls. What can I do on the outside of my home?  Regularly fix the edges of walkways and driveways and fix any cracks.  Remove anything that might make you trip as you walk through a door, such as a raised step or threshold.  Trim any bushes or trees on the path to your home.  Use bright outdoor lighting.  Clear any walking paths of anything that might make someone trip, such as rocks or tools.  Regularly check to see if handrails are loose or broken. Make sure that both sides of any steps have handrails.  Any raised decks and porches should have guardrails on the edges.  Have any leaves, snow, or ice cleared regularly.  Use sand or salt on walking paths during winter.  Clean up any spills in your garage right away. This includes oil or grease  spills. What can I do in the bathroom?  Use night lights.  Install grab bars by the toilet and in the tub and shower. Do not use towel bars as grab bars.  Use non-skid mats or decals in the tub or shower.  If you need to sit down in the shower, use a plastic, non-slip stool.  Keep the floor dry. Clean up any water that spills on the floor as soon as it happens.  Remove soap buildup in the tub or shower regularly.  Attach bath mats securely with double-sided non-slip rug tape.  Do not have throw rugs and other things on the floor that can make you trip. What can I do in the bedroom?  Use night lights.  Make sure that you have a light by your bed that is easy to reach.  Do not use any sheets or blankets that are too big for your bed. They should not hang down onto the floor.  Have a firm chair that has side arms. You can use this for support while you get dressed.  Do not have throw rugs and other things on the floor that can make you trip. What can I do in the kitchen?  Clean up any spills right away.  Avoid walking on wet floors.  Keep items that you use a  lot in easy-to-reach places.  If you need to reach something above you, use a strong step stool that has a grab bar.  Keep electrical cords out of the way.  Do not use floor polish or wax that makes floors slippery. If you must use wax, use non-skid floor wax.  Do not have throw rugs and other things on the floor that can make you trip. What can I do with my stairs?  Do not leave any items on the stairs.  Make sure that there are handrails on both sides of the stairs and use them. Fix handrails that are broken or loose. Make sure that handrails are as long as the stairways.  Check any carpeting to make sure that it is firmly attached to the stairs. Fix any carpet that is loose or worn.  Avoid having throw rugs at the top or bottom of the stairs. If you do have throw rugs, attach them to the floor with carpet  tape.  Make sure that you have a light switch at the top of the stairs and the bottom of the stairs. If you do not have them, ask someone to add them for you. What else can I do to help prevent falls?  Wear shoes that:  Do not have high heels.  Have rubber bottoms.  Are comfortable and fit you well.  Are closed at the toe. Do not wear sandals.  If you use a stepladder:  Make sure that it is fully opened. Do not climb a closed stepladder.  Make sure that both sides of the stepladder are locked into place.  Ask someone to hold it for you, if possible.  Clearly mark and make sure that you can see:  Any grab bars or handrails.  First and last steps.  Where the edge of each step is.  Use tools that help you move around (mobility aids) if they are needed. These include:  Canes.  Walkers.  Scooters.  Crutches.  Turn on the lights when you go into a dark area. Replace any light bulbs as soon as they burn out.  Set up your furniture so you have a clear path. Avoid moving your furniture around.  If any of your floors are uneven, fix them.  If there are any pets around you, be aware of where they are.  Review your medicines with your doctor. Some medicines can make you feel dizzy. This can increase your chance of falling. Ask your doctor what other things that you can do to help prevent falls. This information is not intended to replace advice given to you by your health care provider. Make sure you discuss any questions you have with your health care provider. Document Released: 07/26/2009 Document Revised: 03/06/2016 Document Reviewed: 11/03/2014 Elsevier Interactive Patient Education  2017 [employer].

## 2020-03-28 NOTE — Progress Notes (Signed)
Subjective:   Carl Trujillo is a 69 y.o. male who presents for Medicare Annual/Subsequent preventive examination.  Review of Systems:  No ROS. Medicare Wellness Visit. Additional risk factors are reflected in social history. Cardiac Risk Factors include: advanced age (>56men, >48 women);dyslipidemia;hypertension;male gender     Objective:    Vitals: BP 140/80   Pulse 63   Temp 98.1 F (36.7 C)   Resp 16   Ht 5\' 11"  (1.803 m)   Wt 162 lb 12.8 oz (73.8 kg)   SpO2 95%   BMI 22.71 kg/m   Body mass index is 22.71 kg/m.  Advanced Directives 03/28/2020 03/17/2018  Does Patient Have a Medical Advance Directive? Yes Yes  Type of 05/17/2018 of [place];Living will Healthcare Power of [place];Living will  Does patient want to make changes to medical advance directive? No - Patient declined -  Copy of Healthcare Power of Attorney in Chart? No - copy requested No - copy requested    Tobacco Social History   Tobacco Use  Smoking Status Former Smoker  . Years: 23.00  . Types: Cigars  . Quit date: 10/13/1997  . Years since quitting: 22.4  Smokeless Tobacco Never Used  Tobacco Comment   current smoker of cigars in the pm - stopped cigarettes 28yrs ago     Counseling given: No Comment: current smoker of cigars in the pm - stopped cigarettes 60yrs ago   Clinical Intake:     Pain : No/denies pain Pain Score: 0-No pain     BMI - recorded: 22.59 Nutritional Status: BMI of 19-24  Normal Nutritional Risks: None Diabetes: No  How often do you need to have someone help you when you read instructions, pamphlets, or other written materials from your doctor or pharmacy?: 1 - Never What is the last grade level you completed in school?: Retired 4yr Needed?: No  Information entered by :: [phone]. [employer], LPN  Past Medical History:  Diagnosis Date  . Anxiety   . Arthritis of right knee   . Arthritis of spine   . ED (erectile  dysfunction)   . Hypertension    History reviewed. No pertinent surgical history. Family History  Problem Relation Age of Onset  . Cancer Other   . Coronary artery disease Other   . CVA Sister   . Cancer Mother   . Post-traumatic stress disorder Brother    Social History   Socioeconomic History  . Marital status: Single    Spouse name: Not on file  . Number of children: Not on file  . Years of education: Not on file  . Highest education level: Not on file  Occupational History  . Occupation: retired  Tobacco Use  . Smoking status: Former Smoker    Years: 23.00    Types: Cigars    Quit date: 10/13/1997    Years since quitting: 22.4  . Smokeless tobacco: Never Used  . Tobacco comment: current smoker of cigars in the pm - stopped cigarettes 21yrs ago  Vaping Use  . Vaping Use: Never used  Substance and Sexual Activity  . Alcohol use: Yes    Alcohol/week: 3.0 standard drinks    Types: 3 Glasses of wine per week  . Drug use: No  . Sexual activity: Not Currently  Other Topics Concern  . Not on file  Social History Narrative  . Not on file   Social Determinants of Health   Financial Resource Strain:   . Difficulty  of Paying Living Expenses:   Food Insecurity:   . Worried About Programme researcher, broadcasting/film/video in the Last Year:   . Barista in the Last Year:   Transportation Needs:   . Freight forwarder (Medical):   [name] Lack of Transportation (Non-Medical):   Physical Activity:   . Days of Exercise per Week:   . Minutes of Exercise per Session:   Stress:   . Feeling of Stress :   Social Connections:   . Frequency of Communication with Friends and Family:   . Frequency of Social Gatherings with Friends and Family:   . Attends Religious Services:   . Active Member of Clubs or Organizations:   . Attends Banker Meetings:   [name] Marital Status:     Outpatient Encounter Medications as of 03/28/2020  Medication Sig  . alprazolam (XANAX) 2 MG tablet Take  1/2-1 tab po TID prn anxiety and insomnia  . Ascorbic Acid (VITAMIN C) 1000 MG tablet Take 1,000 mg by mouth daily.  [name] aspirin 81 MG tablet Take 81 mg by mouth daily.  [name] 450 MG CAPS Take by mouth.  . cholecalciferol (VITAMIN D) 1000 units tablet Take 1,000 Units by mouth daily.  [name] Cod Liver Oil 1000 MG CAPS Take by mouth.  . cyclobenzaprine (FLEXERIL) 10 MG tablet Take 1 tablet by mouth twice daily as needed  . diclofenac (VOLTAREN) 75 MG EC tablet TAKE 1 TABLET BY MOUTH TWICE DAILY ALTERNATES  WITH  TYLENOL  ARTHRITIS  . losartan-hydrochlorothiazide (HYZAAR) 100-25 MG tablet Take 1 tablet by mouth daily.  . Magnesium-Zinc 133.33-5 MG TABS Take by mouth.  . Omega-3 Fatty Acids (OMEGA 3 PO) Take by mouth. Omega XL - 2 capsules daily  . pyridoxine (B-6) 100 MG tablet Take 100 mg by mouth daily.  . sildenafil (VIAGRA) 100 MG tablet Take 1 tablet (100 mg total) by mouth daily as needed. Use as instructed  . Specialty Vitamins Products (CENTRUM PERFORMANCE) TABS Take by mouth.    . vitamin B-12 (CYANOCOBALAMIN) 1000 MCG tablet Take 1,000 mcg by mouth daily.     No facility-administered encounter medications on file as of 03/28/2020.    Activities of Daily Living In your present state of health, do you have any difficulty performing the following activities: 03/28/2020  Hearing? N  Vision? N  Difficulty concentrating or making decisions? N  Walking or climbing stairs? N  Dressing or bathing? N  Doing errands, shopping? N  Preparing Food and eating ? N  Using the Toilet? N  In the past six months, have you accidently leaked urine? N  Do you have problems with loss of bowel control? N  Managing your Medications? N  Managing your Finances? N  Housekeeping or managing your Housekeeping? N  Some recent data might be hidden    Patient Care Team: Corwin Levins, MD as PCP - General (Internal Medicine) Jerilee Field, MD as Consulting Physician (Urology) Waymon Budge, MD  as Consulting Physician (Pulmonary Disease)   Assessment:   This is a routine wellness examination for Carl Trujillo.  Exercise Activities and Dietary recommendations Current Exercise Habits: Home exercise routine, Type of exercise: walking;stretching;strength training/weights, Time (Minutes): 30, Frequency (Times/Week): 5, Weekly Exercise (Minutes/Week): 150, Intensity: Moderate, Exercise limited by: None identified  Goals    . Patient Stated     Stay as healthy and as independent as possible. Enjoy life, family, and travel.       Fall Risk  Fall Risk  03/28/2020 12/21/2019 03/17/2018 07/03/2017  Falls in the past year? 0 0 No No  Number falls in past yr: 0 - - -  Injury with Fall? 0 - - -  Risk for fall due to : No Fall Risks - - -  Follow up Falls evaluation completed;Education provided - - -   Is the patient's home free of loose throw rugs in walkways, pet beds, electrical cords, etc?   yes      Grab bars in the bathroom? yes      Handrails on the stairs?   yes      Adequate lighting?   yes  Timed Get Up and Go Performed: not indicated  Depression Screen PHQ 2/9 Scores 03/28/2020 12/21/2019 03/17/2018 07/03/2017  PHQ - 2 Score 0 1 0 0  PHQ- 9 Score - - 0 -    Cognitive Function MMSE - Mini Mental State Exam 03/17/2018  Orientation to time 5  Orientation to Place 5  Registration 3  Attention/ Calculation 5  Recall 2  Language- name 2 objects 2  Language- repeat 1  Language- follow 3 step command 3  Language- read & follow direction 1  Write a sentence 1  Copy design 1  Total score 29     6CIT Screen 03/28/2020  What Year? 0 points  What month? 0 points  What time? 0 points  Count back from 20 0 points  Months in reverse 0 points  Repeat phrase 0 points  Total Score 0    Immunization History  Administered Date(s) Administered  . Influenza Split 06/27/2013, 08/22/2016, 07/13/2017  . Influenza Whole 07/13/2008, 07/11/2009  . Influenza, High Dose Seasonal PF 06/24/2018  .  Influenza-Unspecified 07/18/2014  . PFIZER SARS-COV-2 Vaccination 12/09/2019, 01/03/2020  . Pneumococcal Polysaccharide-23 08/22/2016  . Td 07/13/2008    Qualifies for Shingles Vaccine? Yes  Screening Tests Health Maintenance  Topic Date Due  . TETANUS/TDAP  12/20/2020 (Originally 07/13/2018)  . PNA vac Low Risk Adult (2 of 2 - PCV13) 12/20/2020 (Originally 08/22/2017)  . INFLUENZA VACCINE  05/13/2020  . COLONOSCOPY  01/22/2028  . COVID-19 Vaccine  Completed  . Hepatitis C Screening  Completed   Cancer Screenings: Lung: Low Dose CT Chest recommended if Age 2-80 years, 30 pack-year currently smoking OR have quit w/in 15years. Patient does not qualify. Colorectal: Yes  Additional Screenings: Hepatitis C Screening: completed      Plan:     Reviewed health maintenance screenings with patient today and relevant education, vaccines, and/or referrals were provided.    Continue doing brain stimulating activities (puzzles, reading, adult coloring books, staying active) to keep memory sharp.    Continue to eat heart healthy diet (full of fruits, vegetables, whole grains, lean protein, water--limit salt, fat, and sugar intake) and increase physical activity as tolerated.  I have personally reviewed and noted the following in the patient's chart:   . Medical and social history . Use of alcohol, tobacco or illicit drugs  . Current medications and supplements . Functional ability and status . Nutritional status . Physical activity . Advanced directives . List of other physicians . Hospitalizations, surgeries, and ER visits in previous 12 months . Vitals . Screenings to include cognitive, depression, and falls . Referrals and appointments  In addition, I have reviewed and discussed with patient certain preventive protocols, quality metrics, and best practice recommendations. A written personalized care plan for preventive services as well as general preventive health recommendations  were provided to patient.  Sheral Flow, LPN  [id number]  Nurse Health Advisor

## 2020-03-28 NOTE — Progress Notes (Signed)
Subjective:    Patient ID: Carl Trujillo, male    DOB: 08-17-1951, 69 y.o.   MRN: [id number]  HPI  Here to f/u; overall doing ok,  Pt denies chest pain, increasing sob or doe, wheezing, orthopnea, PND, increased LE swelling, palpitations, dizziness or syncope.  Pt denies new neurological symptoms such as new headache, or facial or extremity weakness or numbness.  Pt denies polydipsia, polyuria, or low sugar episode.  Pt states overall good compliance with meds, mostly trying to follow appropriate diet, with wt overall stable,  but little exercise however.  No new complaints Past Medical History:  Diagnosis Date  . Anxiety   . Arthritis of right knee   . Arthritis of spine   . ED (erectile dysfunction)   . Hypertension    No past surgical history on file.  reports that he quit smoking about 22 years ago. His smoking use included cigars. He quit after 23.00 years of use. He has never used smokeless tobacco. He reports current alcohol use of about 3.0 standard drinks of alcohol per week. He reports that he does not use drugs. family history includes CVA in his sister; Cancer in his mother and another family member; Coronary artery disease in an other family member; Post-traumatic stress disorder in his brother. No Known Allergies Current Outpatient Medications on File Prior to Visit  Medication Sig Dispense Refill  . alprazolam (XANAX) 2 MG tablet Take 1/2-1 tab po TID prn anxiety and insomnia 80 tablet 5  . Ascorbic Acid (VITAMIN C) 1000 MG tablet Take 1,000 mg by mouth daily.    [name] aspirin 81 MG tablet Take 81 mg by mouth daily.    [name] 450 MG CAPS Take by mouth.    . cholecalciferol (VITAMIN D) 1000 units tablet Take 1,000 Units by mouth daily.    [name] Cod Liver Oil 1000 MG CAPS Take by mouth.    . cyclobenzaprine (FLEXERIL) 10 MG tablet Take 1 tablet by mouth twice daily as needed 60 tablet 0  . diclofenac (VOLTAREN) 75 MG EC tablet TAKE 1 TABLET BY MOUTH TWICE DAILY ALTERNATES   WITH  TYLENOL  ARTHRITIS 60 tablet 5  . losartan-hydrochlorothiazide (HYZAAR) 100-25 MG tablet Take 1 tablet by mouth daily. 90 tablet 3  . Magnesium-Zinc 133.33-5 MG TABS Take by mouth.    . Omega-3 Fatty Acids (OMEGA 3 PO) Take by mouth. Omega XL - 2 capsules daily    . pyridoxine (B-6) 100 MG tablet Take 100 mg by mouth daily.    . sildenafil (VIAGRA) 100 MG tablet Take 1 tablet (100 mg total) by mouth daily as needed. Use as instructed 5 tablet 11  . Specialty Vitamins Products (CENTRUM PERFORMANCE) TABS Take by mouth.      . vitamin B-12 (CYANOCOBALAMIN) 1000 MCG tablet Take 1,000 mcg by mouth daily.       No current facility-administered medications on file prior to visit.   Wt Readings from Last 3 Encounters:  03/28/20 162 lb (73.5 kg)  03/28/20 162 lb 12.8 oz (73.8 kg)  12/21/19 159 lb (72.1 kg)   Review of Systems All otherwise neg per pt     Objective:   Physical Exam BP 140/80   Pulse 63   Temp 98.1 F (36.7 C) (Oral)   Ht 5\' 11"  (1.803 m)   Wt 162 lb (73.5 kg)   SpO2 95%   BMI 22.59 kg/m  VS noted,  Constitutional: Pt appears in NAD HENT: Head: NCAT.  Right Ear: External ear normal.  Left Ear: External ear normal.  Eyes: . Pupils are equal, round, and reactive to light. Conjunctivae and EOM are normal Nose: without d/c or deformity Neck: Neck supple. Gross normal ROM Cardiovascular: Normal rate and regular rhythm.   Pulmonary/Chest: Effort normal and breath sounds without rales or wheezing.  Abd:  Soft, NT, ND, + BS, no organomegaly Neurological: Pt is alert. At baseline orientation, motor grossly intact Skin: Skin is warm. No rashes, other new lesions, no LE edema Psychiatric: Pt behavior is normal without agitation  All otherwise neg per pt Lab Results  Component Value Date   WBC 4.5 12/22/2019   HGB 15.3 12/22/2019   HCT 44.6 12/22/2019   PLT 232.0 12/22/2019   GLUCOSE 111 (H) 12/22/2019   CHOL 176 12/22/2019   TRIG 70.0 12/22/2019   HDL 74.80  12/22/2019   LDLCALC 87 12/22/2019   ALT 19 12/22/2019   AST 21 12/22/2019   NA 140 12/22/2019   K 4.2 12/22/2019   CL 100 12/22/2019   CREATININE 1.01 12/22/2019   BUN 17 12/22/2019   CO2 34 (H) 12/22/2019   TSH 5.31 (H) 12/22/2019   PSA 1.14 12/22/2019   HGBA1C 5.2 11/12/2017      Assessment & Plan:

## 2020-03-28 NOTE — Patient Instructions (Signed)
Please continue all other medications as before, and refills have been done if requested. ° °Please have the pharmacy call with any other refills you may need. ° °Please continue your efforts at being more active, low cholesterol diet, and weight control. ° °You are otherwise up to date with prevention measures today. ° °Please keep your appointments with your specialists as you may have planned ° °Please make an Appointment to return in 6 months, or sooner if needed °

## 2020-05-22 ENCOUNTER — Other Ambulatory Visit: Payer: Self-pay | Admitting: Internal Medicine

## 2020-05-22 DIAGNOSIS — G8929 Other chronic pain: Secondary | ICD-10-CM

## 2020-07-23 ENCOUNTER — Other Ambulatory Visit: Payer: Self-pay | Admitting: Internal Medicine

## 2020-07-23 DIAGNOSIS — M5442 Lumbago with sciatica, left side: Secondary | ICD-10-CM

## 2020-07-23 DIAGNOSIS — G8929 Other chronic pain: Secondary | ICD-10-CM

## 2020-07-25 ENCOUNTER — Other Ambulatory Visit (HOSPITAL_COMMUNITY): Payer: Self-pay | Admitting: Internal Medicine

## 2020-07-25 ENCOUNTER — Ambulatory Visit: Payer: Medicare HMO | Attending: Internal Medicine

## 2020-07-25 DIAGNOSIS — Z23 Encounter for immunization: Secondary | ICD-10-CM

## 2020-07-25 NOTE — Progress Notes (Signed)
   Covid-19 Vaccination Clinic  Name:  Linkon Siverson    MRN: [id number] DOB: 06-30-1951  07/25/2020  Mr. Caraway was observed post Covid-19 immunization for 15 minutes without incident. He was provided with Vaccine Information Sheet and instruction to access the V-Safe system.   Mr. Muscatello was instructed to call 911 with any severe reactions post vaccine: [name] Difficulty breathing  . Swelling of face and throat  . A fast heartbeat  . A bad rash all over body  . Dizziness and weakness

## 2020-08-13 DIAGNOSIS — N400 Enlarged prostate without lower urinary tract symptoms: Secondary | ICD-10-CM | POA: Diagnosis not present

## 2020-08-13 DIAGNOSIS — N5201 Erectile dysfunction due to arterial insufficiency: Secondary | ICD-10-CM | POA: Diagnosis not present

## 2020-08-17 ENCOUNTER — Ambulatory Visit (INDEPENDENT_AMBULATORY_CARE_PROVIDER_SITE_OTHER): Payer: Medicare HMO | Admitting: Psychiatry

## 2020-08-17 ENCOUNTER — Encounter: Payer: Self-pay | Admitting: Psychiatry

## 2020-08-17 ENCOUNTER — Other Ambulatory Visit: Payer: Self-pay

## 2020-08-17 DIAGNOSIS — F411 Generalized anxiety disorder: Secondary | ICD-10-CM | POA: Diagnosis not present

## 2020-08-17 MED ORDER — ALPRAZOLAM 2 MG PO TABS: ORAL_TABLET | ORAL | 5 refills | Status: DC

## 2020-08-17 NOTE — Progress Notes (Signed)
Carl Trujillo [id number] 06-14-1951 69 y.o.  Subjective:   Patient ID:  Carl Trujillo is a 69 y.o. (DOB [birth year]) male.  Chief Complaint:  Chief Complaint  Patient presents with  . Follow-up    h/o Anxiety    HPI Carl Trujillo presents to the office today for follow-up of anxiety. He denies any significant anxiety. Denies depressed mood. He reports that he has been eating well and gained 12 lbs. He attributes this to eating meat again. Sleeping well. Energy is "great." Motivation is good. He reports that his focus is ok. Occasionally has some difficulty finding the word he is looking for. Denies SI. "I want to live to be 89."  Sister is in nursing home and had a heart attack. He got together with his brothers yesterday. He reports that he stays at home most of the time to minimize COVID exposure risk.   [place] [facility] [place] [employer].    Mini-Mental     Clinical Support from 03/17/2018 in [facility]  Total Score (max 30 points ) 29    PHQ2-9     Clinical Support from 03/28/2020 in [facility] Visit from 12/21/2019 in [facility] Clinical Support from 03/17/2018 in [facility] Office Visit from 07/03/2017 in [facility]  PHQ-2 Total Score 0 1 0 0  PHQ-9 Total Score -- -- 0 --       Review of Systems:  Review of Systems  Gastrointestinal: Negative.   Musculoskeletal: Negative for gait problem.       Knee pain  Neurological: Negative for tremors.  Psychiatric/Behavioral:       Please refer to HPI    Medications: I have reviewed the patient's current medications.  Current Outpatient Medications  Medication Sig Dispense Refill  . [START ON 08/21/2020] alprazolam (XANAX) 2 MG tablet Take 1/2-1 tab po TID prn anxiety and insomnia 80 tablet 5  . Ascorbic Acid (VITAMIN C) 1000 MG tablet Take 5,000 mg by mouth daily.     [name] aspirin 81 MG tablet Take  81 mg by mouth daily.    [name] 450 MG CAPS Take by mouth.    . cholecalciferol (VITAMIN D) 1000 units tablet Take 1,000 Units by mouth daily.    [name] Cod Liver Oil 1000 MG CAPS Take by mouth.    . cyclobenzaprine (FLEXERIL) 10 MG tablet Take 1 tablet by mouth twice daily as needed 60 tablet 0  . diclofenac (VOLTAREN) 75 MG EC tablet TAKE 1 TABLET BY MOUTH TWICE DAILY ALTERNATES  WITH  TYLENOL  ARTHRITIS 60 tablet 5  . losartan-hydrochlorothiazide (HYZAAR) 100-25 MG tablet Take 1 tablet by mouth daily. 90 tablet 3  . Magnesium-Zinc 133.33-5 MG TABS Take by mouth.    . Omega-3 Fatty Acids (OMEGA 3 PO) Take by mouth. Omega XL - 2 capsules daily    . pyridoxine (B-6) 100 MG tablet Take 100 mg by mouth daily.    . sildenafil (VIAGRA) 100 MG tablet Take 1 tablet (100 mg total) by mouth daily as needed. Use as instructed 5 tablet 11  . Specialty Vitamins Products (CENTRUM PERFORMANCE) TABS Take by mouth.      . vitamin B-12 (CYANOCOBALAMIN) 1000 MCG tablet Take 1,000 mcg by mouth daily.       No current facility-administered medications for this visit.    Medication Side Effects: None  Allergies: No Known Allergies  Past Medical History:  Diagnosis Date  . Anxiety   . Arthritis of right knee   . Arthritis of spine   . ED (erectile dysfunction)   . Hypertension     Family History  Problem Relation Age of Onset  . Cancer Other   . Coronary artery disease Other   . CVA Sister   . Cancer Mother   . Post-traumatic stress disorder Brother     Social History   Socioeconomic History  . Marital status: Single    Spouse name: Not on file  . Number of children: Not on file  . Years of education: Not on file  . Highest education level: Not on file  Occupational History  . Occupation: retired  Tobacco Use  . Smoking status: Former Smoker    Years: 23.00    Types: Cigars    Quit date: 10/13/1997    Years since quitting: 22.8  . Smokeless tobacco: Never Used  . Tobacco  comment: current smoker of cigars in the pm - stopped cigarettes 52yrs ago  Vaping Use  . Vaping Use: Never used  Substance and Sexual Activity  . Alcohol use: Yes    Alcohol/week: 3.0 standard drinks    Types: 3 Glasses of wine per week  . Drug use: No  . Sexual activity: Not Currently  Other Topics Concern  . Not on file  Social History Narrative  . Not on file   Social Determinants of Health   Financial Resource Strain:   . Difficulty of Paying Living Expenses: Not on file  Food Insecurity:   . Worried About Programme researcher, broadcasting/film/video in the Last Year: Not on file  . Ran Out of Food in the Last Year: Not on file  Transportation Needs:   . Lack of Transportation (Medical): Not on file  . Lack of Transportation (Non-Medical): Not on file  Physical Activity:   . Days of Exercise per Week: Not on file  . Minutes of Exercise per Session: Not on file  Stress:   . Feeling of Stress : Not on file  Social Connections:   . Frequency of Communication with Friends and Family: Not on file  . Frequency of Social Gatherings with Friends and Family: Not on file  . Attends Religious Services: Not on file  . Active Member of Clubs or Organizations: Not on file  . Attends Banker Meetings: Not on file  . Marital Status: Not on file  Intimate Partner Violence:   . Fear of Current or Ex-Partner: Not on file  . Emotionally Abused: Not on file  . Physically Abused: Not on file  . Sexually Abused: Not on file    Past Medical History, Surgical history, Social history, and Family history were reviewed and updated as appropriate.   Please see review of systems for further details on the patient's review from today.   Objective:   Physical Exam:  BP 128/78   Wt 161 lb (73 kg)   BMI 22.45 kg/m   Physical Exam Constitutional:      General: He is not in acute distress. Musculoskeletal:        General: No deformity.  Neurological:     Mental Status: He is alert and oriented to  person, place, and time.     Coordination: Coordination normal.  Psychiatric:        Attention and Perception: Attention and perception normal. He does not perceive auditory or visual hallucinations.        Mood and Affect:  Mood normal. Mood is not anxious or depressed. Affect is not labile, blunt, angry or inappropriate.        Speech: Speech normal.        Behavior: Behavior normal.        Thought Content: Thought content normal. Thought content is not paranoid or delusional. Thought content does not include homicidal or suicidal ideation. Thought content does not include homicidal or suicidal plan.        Cognition and Memory: Cognition and memory normal.        Judgment: Judgment normal.     Comments: Insight intact     Lab Review:     Component Value Date/Time   NA 140 12/22/2019 0853   K 4.2 12/22/2019 0853   CL 100 12/22/2019 0853   CO2 34 (H) 12/22/2019 0853   GLUCOSE 111 (H) 12/22/2019 0853   BUN 17 12/22/2019 0853   CREATININE 1.01 12/22/2019 0853   CALCIUM 9.8 12/22/2019 0853   PROT 6.8 12/22/2019 0853   ALBUMIN 4.2 12/22/2019 0853   AST 21 12/22/2019 0853   ALT 19 12/22/2019 0853   ALKPHOS 89 12/22/2019 0853   BILITOT 0.9 12/22/2019 0853   GFRNONAA 88.07 11/14/2009 1036       Component Value Date/Time   WBC 4.5 12/22/2019 0853   RBC 4.77 12/22/2019 0853   HGB 15.3 12/22/2019 0853   HCT 44.6 12/22/2019 0853   PLT 232.0 12/22/2019 0853   MCV 93.4 12/22/2019 0853   MCHC 34.3 12/22/2019 0853   RDW 13.3 12/22/2019 0853   LYMPHSABS 1.1 12/22/2019 0853   MONOABS 0.6 12/22/2019 0853   EOSABS 0.4 12/22/2019 0853   BASOSABS 0.0 12/22/2019 0853    No results found for: POCLITH, LITHIUM   No results found for: PHENYTOIN, PHENOBARB, VALPROATE, CBMZ   .res Assessment: Plan:   Continue Xanax 2 mg 1/2-1 tab po TID prn anxiety.  Pt to follow-up in 6 months or sooner if clinically indicated.  Patient advised to contact office with any questions, adverse effects, or  acute worsening in signs and symptoms.  Marvelle was seen today for follow-up.  Diagnoses and all orders for this visit:  Anxiety state -     alprazolam (XANAX) 2 MG tablet; Take 1/2-1 tab po TID prn anxiety and insomnia     Please see After Visit Summary for patient specific instructions.  Future Appointments  Date Time Provider Department Center  09/27/2020  3:00 PM Corwin Levins, MD [facility] None  02/15/2021 11:30 AM Corie Chiquito, PMHNP [facility] None    No orders of the defined types were placed in this encounter.   -------------------------------

## 2020-09-27 ENCOUNTER — Encounter: Payer: Self-pay | Admitting: Internal Medicine

## 2020-09-27 ENCOUNTER — Ambulatory Visit (INDEPENDENT_AMBULATORY_CARE_PROVIDER_SITE_OTHER): Payer: Medicare HMO | Admitting: Internal Medicine

## 2020-09-27 ENCOUNTER — Other Ambulatory Visit: Payer: Self-pay | Admitting: Internal Medicine

## 2020-09-27 ENCOUNTER — Other Ambulatory Visit: Payer: Self-pay

## 2020-09-27 VITALS — BP 130/76 | HR 61 | Temp 98.1°F | Ht 71.0 in | Wt 163.2 lb

## 2020-09-27 DIAGNOSIS — Z23 Encounter for immunization: Secondary | ICD-10-CM

## 2020-09-27 DIAGNOSIS — F5101 Primary insomnia: Secondary | ICD-10-CM

## 2020-09-27 DIAGNOSIS — E782 Mixed hyperlipidemia: Secondary | ICD-10-CM

## 2020-09-27 DIAGNOSIS — I10 Essential (primary) hypertension: Secondary | ICD-10-CM | POA: Diagnosis not present

## 2020-09-27 DIAGNOSIS — M5442 Lumbago with sciatica, left side: Secondary | ICD-10-CM

## 2020-09-27 DIAGNOSIS — R739 Hyperglycemia, unspecified: Secondary | ICD-10-CM | POA: Diagnosis not present

## 2020-09-27 DIAGNOSIS — G8929 Other chronic pain: Secondary | ICD-10-CM

## 2020-09-27 DIAGNOSIS — G4700 Insomnia, unspecified: Secondary | ICD-10-CM | POA: Insufficient documentation

## 2020-09-27 MED ORDER — TRAZODONE HCL 100 MG PO TABS: 100.0000 mg | ORAL_TABLET | Freq: Every day | ORAL | 1 refills | Status: DC

## 2020-09-27 NOTE — Patient Instructions (Addendum)
You had the flu shot today  Please take all new medication as prescribed - the trazodone for sleep  Please continue all other medications as before, and refills have been done if requested.  Please have the pharmacy call with any other refills you may need.  Please continue your efforts at being more active, low cholesterol diet, and weight control.  Please keep your appointments with your specialists as you may have planned  Please make an Appointment to return in 3 months

## 2020-09-27 NOTE — Progress Notes (Signed)
Subjective:    Patient ID: Carl Trujillo, male    DOB: November 10, 1950, 68 y.o.   MRN: [id number]  HPI  Here to f/u; overall doing ok,  Pt denies chest pain, increasing sob or doe, wheezing, orthopnea, PND, increased LE swelling, palpitations, dizziness or syncope.  Pt denies new neurological symptoms such as new headache, or facial or extremity weakness or numbness.  Pt denies polydipsia, polyuria, or low sugar episode.  Pt states overall good compliance with meds, mostly trying to follow appropriate diet, with wt overall stable,  but little exercise however.  More stress recently.   Sister died recently 35yo with hx of stroke.   Just cant get to sleep most nights Past Medical History:  Diagnosis Date  . Anxiety   . Arthritis of right knee   . Arthritis of spine   . ED (erectile dysfunction)   . Hypertension    History reviewed. No pertinent surgical history.  reports that he quit smoking about 22 years ago. His smoking use included cigars. He quit after 23.00 years of use. He has never used smokeless tobacco. He reports current alcohol use of about 3.0 standard drinks of alcohol per week. He reports that he does not use drugs. family history includes CVA in his sister; Cancer in his mother and another family member; Coronary artery disease in an other family member; Post-traumatic stress disorder in his brother. No Known Allergies Current Outpatient Medications on File Prior to Visit  Medication Sig Dispense Refill  . alprazolam (XANAX) 2 MG tablet Take 1/2-1 tab po TID prn anxiety and insomnia 80 tablet 5  . Ascorbic Acid (VITAMIN C) 1000 MG tablet Take 5,000 mg by mouth daily.     [name] aspirin 81 MG tablet Take 81 mg by mouth daily.    [name] 450 MG CAPS Take by mouth.    . cholecalciferol (VITAMIN D) 1000 units tablet Take 1,000 Units by mouth daily.    [name] Cod Liver Oil 1000 MG CAPS Take by mouth.    . losartan-hydrochlorothiazide (HYZAAR) 100-25 MG tablet Take 1 tablet by mouth  daily. 90 tablet 3  . Magnesium-Zinc 133.33-5 MG TABS Take by mouth.    . Omega-3 Fatty Acids (OMEGA 3 PO) Take by mouth. Omega XL - 2 capsules daily    . pyridoxine (B-6) 100 MG tablet Take 100 mg by mouth daily.    . sildenafil (VIAGRA) 100 MG tablet Take 1 tablet (100 mg total) by mouth daily as needed. Use as instructed 5 tablet 11  . Specialty Vitamins Products (CENTRUM PERFORMANCE) TABS Take by mouth.    . vitamin B-12 (CYANOCOBALAMIN) 1000 MCG tablet Take 1,000 mcg by mouth daily.     No current facility-administered medications on file prior to visit.   Review of Systems All otherwise neg per pt     Objective:   Physical Exam BP 130/76   Pulse 61   Temp 98.1 F (36.7 C) (Oral)   Ht 5\' 11"  (1.803 m)   Wt 163 lb 3.2 oz (74 kg)   SpO2 92%   BMI 22.76 kg/m  VS noted,  Constitutional: Pt appears in NAD HENT: Head: NCAT.  Right Ear: External ear normal.  Left Ear: External ear normal.  Eyes: . Pupils are equal, round, and reactive to light. Conjunctivae and EOM are normal Nose: without d/c or deformity Neck: Neck supple. Gross normal ROM Cardiovascular: Normal rate and regular rhythm.   Pulmonary/Chest: Effort normal and breath sounds without rales  or wheezing.  Abd:  Soft, NT, ND, + BS, no organomegaly Neurological: Pt is alert. At baseline orientation, motor grossly intact Skin: Skin is warm. No rashes, other new lesions, no LE edema Psychiatric: Pt behavior is normal without agitation  All otherwise neg per pt Lab Results  Component Value Date   WBC 4.5 12/22/2019   HGB 15.3 12/22/2019   HCT 44.6 12/22/2019   PLT 232.0 12/22/2019   GLUCOSE 111 (H) 12/22/2019   CHOL 176 12/22/2019   TRIG 70.0 12/22/2019   HDL 74.80 12/22/2019   LDLCALC 87 12/22/2019   ALT 19 12/22/2019   AST 21 12/22/2019   NA 140 12/22/2019   K 4.2 12/22/2019   CL 100 12/22/2019   CREATININE 1.01 12/22/2019   BUN 17 12/22/2019   CO2 34 (H) 12/22/2019   TSH 5.31 (H) 12/22/2019   PSA  1.14 12/22/2019   HGBA1C 5.2 11/12/2017       Assessment & Plan:

## 2020-09-30 ENCOUNTER — Encounter: Payer: Self-pay | Admitting: Internal Medicine

## 2020-09-30 NOTE — Assessment & Plan Note (Addendum)
Ok for trazodone 100 qhs prn  I spent 31 minutes in preparing to see the patient by review of recent labs, imaging and procedures, obtaining and reviewing separately obtained history, communicating with the patient and family or caregiver, ordering medications, tests or procedures, and documenting clinical information in the EHR including the differential Dx, treatment, and any further evaluation and other management of insomnia, hld, htn, hyperglycemia

## 2020-09-30 NOTE — Assessment & Plan Note (Signed)
stable overall by history and exam, recent data reviewed with pt, and pt to continue medical treatment as before,  to f/u any worsening symptoms or concerns  

## 2020-11-19 ENCOUNTER — Other Ambulatory Visit: Payer: Self-pay | Admitting: Internal Medicine

## 2020-11-19 DIAGNOSIS — G8929 Other chronic pain: Secondary | ICD-10-CM

## 2020-12-03 DIAGNOSIS — Z113 Encounter for screening for infections with a predominantly sexual mode of transmission: Secondary | ICD-10-CM | POA: Diagnosis not present

## 2020-12-03 DIAGNOSIS — L089 Local infection of the skin and subcutaneous tissue, unspecified: Secondary | ICD-10-CM | POA: Diagnosis not present

## 2020-12-03 DIAGNOSIS — Z114 Encounter for screening for human immunodeficiency virus [HIV]: Secondary | ICD-10-CM | POA: Diagnosis not present

## 2020-12-04 ENCOUNTER — Other Ambulatory Visit: Payer: Self-pay

## 2020-12-06 ENCOUNTER — Other Ambulatory Visit: Payer: Self-pay

## 2020-12-06 ENCOUNTER — Ambulatory Visit (INDEPENDENT_AMBULATORY_CARE_PROVIDER_SITE_OTHER): Payer: Medicare HMO

## 2020-12-06 ENCOUNTER — Encounter: Payer: Self-pay | Admitting: Internal Medicine

## 2020-12-06 ENCOUNTER — Ambulatory Visit (INDEPENDENT_AMBULATORY_CARE_PROVIDER_SITE_OTHER): Payer: Medicare HMO | Admitting: Internal Medicine

## 2020-12-06 VITALS — BP 149/80 | HR 65 | Temp 97.7°F | Ht 71.0 in | Wt 162.6 lb

## 2020-12-06 DIAGNOSIS — Z0000 Encounter for general adult medical examination without abnormal findings: Secondary | ICD-10-CM | POA: Diagnosis not present

## 2020-12-06 DIAGNOSIS — L0291 Cutaneous abscess, unspecified: Secondary | ICD-10-CM

## 2020-12-06 DIAGNOSIS — E559 Vitamin D deficiency, unspecified: Secondary | ICD-10-CM

## 2020-12-06 DIAGNOSIS — E782 Mixed hyperlipidemia: Secondary | ICD-10-CM

## 2020-12-06 DIAGNOSIS — L02512 Cutaneous abscess of left hand: Secondary | ICD-10-CM | POA: Diagnosis not present

## 2020-12-06 DIAGNOSIS — R739 Hyperglycemia, unspecified: Secondary | ICD-10-CM | POA: Diagnosis not present

## 2020-12-06 DIAGNOSIS — E538 Deficiency of other specified B group vitamins: Secondary | ICD-10-CM

## 2020-12-06 DIAGNOSIS — Z0001 Encounter for general adult medical examination with abnormal findings: Secondary | ICD-10-CM | POA: Diagnosis not present

## 2020-12-06 DIAGNOSIS — I10 Essential (primary) hypertension: Secondary | ICD-10-CM | POA: Diagnosis not present

## 2020-12-06 LAB — HEPATIC FUNCTION PANEL
ALT: 31 U/L (ref 0–53)
AST: 27 U/L (ref 0–37)
Albumin: 4.4 g/dL (ref 3.5–5.2)
Alkaline Phosphatase: 75 U/L (ref 39–117)
Bilirubin, Direct: 0.2 mg/dL (ref 0.0–0.3)
Total Bilirubin: 1.1 mg/dL (ref 0.2–1.2)
Total Protein: 7.1 g/dL (ref 6.0–8.3)

## 2020-12-06 LAB — BASIC METABOLIC PANEL
BUN: 13 mg/dL (ref 6–23)
CO2: 33 mEq/L — ABNORMAL HIGH (ref 19–32)
Calcium: 9.4 mg/dL (ref 8.4–10.5)
Chloride: 99 mEq/L (ref 96–112)
Creatinine, Ser: 1.02 mg/dL (ref 0.40–1.50)
GFR: 74.62 mL/min (ref >60.00)
Glucose, Bld: 84 mg/dL (ref 70–99)
Potassium: 3.9 mEq/L (ref 3.5–5.1)
Sodium: 140 mEq/L (ref 135–145)

## 2020-12-06 LAB — TSH: TSH: 2.17 u[IU]/mL (ref 0.35–4.50)

## 2020-12-06 LAB — URINALYSIS, ROUTINE W REFLEX MICROSCOPIC
Bilirubin Urine: NEGATIVE
Hgb urine dipstick: NEGATIVE
Ketones, ur: NEGATIVE
Leukocytes,Ua: NEGATIVE
Nitrite: NEGATIVE
RBC / HPF: NONE SEEN (ref 0–?)
Specific Gravity, Urine: <=1.005 — AB (ref 1.000–1.030)
Total Protein, Urine: NEGATIVE
Urine Glucose: NEGATIVE
Urobilinogen, UA: 0.2 (ref 0.0–1.0)
WBC, UA: NONE SEEN (ref 0–?)
pH: 7 (ref 5.0–8.0)

## 2020-12-06 LAB — PSA: PSA: 1.62 ng/mL (ref 0.10–4.00)

## 2020-12-06 LAB — CBC WITH DIFFERENTIAL/PLATELET
Basophils Absolute: 0 10*3/uL (ref 0.0–0.1)
Basophils Relative: 0.6 % (ref 0.0–3.0)
Eosinophils Absolute: 0.1 10*3/uL (ref 0.0–0.7)
Eosinophils Relative: 2.8 % (ref 0.0–5.0)
HCT: 43 % (ref 39.0–52.0)
Hemoglobin: 14.8 g/dL (ref 13.0–17.0)
Lymphocytes Relative: 22.6 % (ref 12.0–46.0)
Lymphs Abs: 1 10*3/uL (ref 0.7–4.0)
MCHC: 34.4 g/dL (ref 30.0–36.0)
MCV: 93.4 fl (ref 78.0–100.0)
Monocytes Absolute: 0.5 10*3/uL (ref 0.1–1.0)
Monocytes Relative: 11 % (ref 3.0–12.0)
Neutro Abs: 2.8 10*3/uL (ref 1.4–7.7)
Neutrophils Relative %: 63 % (ref 43.0–77.0)
Platelets: 266 10*3/uL (ref 150.0–400.0)
RBC: 4.61 Mil/uL (ref 4.22–5.81)
RDW: 13.1 % (ref 11.5–15.5)
WBC: 4.4 10*3/uL (ref 4.0–10.5)

## 2020-12-06 LAB — LIPID PANEL
Cholesterol: 161 mg/dL (ref 0–200)
HDL: 57 mg/dL (ref >39.00)
LDL Cholesterol: 78 mg/dL (ref 0–99)
NonHDL: 103.97
Total CHOL/HDL Ratio: 3
Triglycerides: 128 mg/dL (ref 0.0–149.0)
VLDL: 25.6 mg/dL (ref 0.0–40.0)

## 2020-12-06 LAB — VITAMIN B12: Vitamin B-12: 1077 pg/mL — ABNORMAL HIGH (ref 211–911)

## 2020-12-06 LAB — VITAMIN D 25 HYDROXY (VIT D DEFICIENCY, FRACTURES): VITD: 31.44 ng/mL (ref 30.00–100.00)

## 2020-12-06 LAB — HEMOGLOBIN A1C: Hgb A1c MFr Bld: 5.8 % (ref 4.6–6.5)

## 2020-12-06 MED ORDER — MUPIROCIN CALCIUM 2 % NA OINT: 1.0000 "application " | TOPICAL_OINTMENT | Freq: Two times a day (BID) | NASAL | 0 refills | Status: DC

## 2020-12-06 MED ORDER — DOXYCYCLINE HYCLATE 100 MG PO TABS: 100.0000 mg | ORAL_TABLET | Freq: Two times a day (BID) | ORAL | 0 refills | Status: AC

## 2020-12-06 NOTE — Patient Instructions (Signed)
Please take all new medication as prescribed - the nose ointment (antibiotic) for 5 days, AND the pill antibiotic for 2 wks  Please continue all other medications as before, and refills have been done if requested.  Please have the pharmacy call with any other refills you may need.  Please continue your efforts at being more active, low cholesterol diet, and weight control.  You are otherwise up to date with prevention measures today.  Please keep your appointments with your specialists as you may have planned  You will be contacted regarding the referral for: MRI for the left hand  Please go to the XRAY Department in the first floor for the x-ray testing  You will be contacted by phone if any changes need to be made immediately.  Otherwise, you will receive a letter about your results with an explanation, but please check with MyChart first.  Please remember to sign up for MyChart if you have not done so, as this will be important to you in the future with finding out test results, communicating by private email, and scheduling acute appointments online when needed.  Please keep your appt in March as you have planned

## 2020-12-06 NOTE — Progress Notes (Signed)
Patient ID: Carl Trujillo, male   DOB: 01-12-1951, 70 y.o.   MRN: [id number]        Chief Complaint:  Several different skin infections and wellness exam       HPI:  Carl Trujillo is a 70 y.o. male for wellness  - declines tdap, pneumovax or colonoscopy o/w up to date with immunizations and preventive referrals  Also here to c/o that the local health dept just "is not professional" and dont seem to care about him personally.  C/o 3 wks onset left index finger abscess wound just not healling well despite seeing the health dept.  Also has abscess in diefferent stages to left axilla, mid to low abdomen.   Pt denies fever, wt loss, night sweats, loss of appetite, or other constitutional symptoms  Also has elevated bp today, but has been "ok " at home,, does not need med change today.  .Pt denies chest pain, increased sob or doe, wheezing, orthopnea, PND, increased LE swelling, palpitations, dizziness or syncope.   Pt denies polydipsia, polyuria.  Denies focal neuro s/s new.   Wt Readings from Last 3 Encounters:  12/06/20 162 lb 9.6 oz (73.8 kg)  09/27/20 163 lb 3.2 oz (74 kg)  03/28/20 162 lb (73.5 kg)   BP Readings from Last 3 Encounters:  12/06/20 (!) 149/80  09/27/20 130/76  03/28/20 140/80         Past Medical History:  Diagnosis Date  . Anxiety   . Arthritis of right knee   . Arthritis of spine   . ED (erectile dysfunction)   . Hypertension    History reviewed. No pertinent surgical history.  reports that he quit smoking about 23 years ago. His smoking use included cigars. He quit after 23.00 years of use. He has never used smokeless tobacco. He reports current alcohol use of about 3.0 standard drinks of alcohol per week. He reports that he does not use drugs. family history includes CVA in his sister; Cancer in his mother and another family member; Coronary artery disease in an other family member; Post-traumatic stress disorder in his brother. No Known Allergies Current Outpatient  Medications on File Prior to Visit  Medication Sig Dispense Refill  . alprazolam (XANAX) 2 MG tablet Take 1/2-1 tab po TID prn anxiety and insomnia 80 tablet 5  . Ascorbic Acid (VITAMIN C) 1000 MG tablet Take 5,000 mg by mouth daily.     [name] aspirin 81 MG tablet Take 81 mg by mouth daily.    [name] 450 MG CAPS Take by mouth.    . cholecalciferol (VITAMIN D) 1000 units tablet Take 1,000 Units by mouth daily.    [name] Cod Liver Oil 1000 MG CAPS Take by mouth.    . cyclobenzaprine (FLEXERIL) 10 MG tablet Take 1 tablet by mouth twice daily as needed 60 tablet 0  . diclofenac (VOLTAREN) 75 MG EC tablet TAKE 1 TABLET BY MOUTH TWICE DAILY ALTERNATES  WITH  TYLENOL  ARTHRITIS 60 tablet 0  . losartan-hydrochlorothiazide (HYZAAR) 100-25 MG tablet Take 1 tablet by mouth daily. 90 tablet 3  . Magnesium-Zinc 133.33-5 MG TABS Take by mouth.    . Omega-3 Fatty Acids (OMEGA 3 PO) Take by mouth. Omega XL - 2 capsules daily    . pyridoxine (B-6) 100 MG tablet Take 100 mg by mouth daily.    . sildenafil (VIAGRA) 100 MG tablet Take 1 tablet (100 mg total) by mouth daily as needed. Use as instructed 5 tablet 11  .  Specialty Vitamins Products (CENTRUM PERFORMANCE) TABS Take by mouth.    . traZODone (DESYREL) 100 MG tablet Take 1 tablet (100 mg total) by mouth at bedtime. 90 tablet 1  . vitamin B-12 (CYANOCOBALAMIN) 1000 MCG tablet Take 1,000 mcg by mouth daily.     No current facility-administered medications on file prior to visit.        ROS:  All others reviewed and negative.  Objective        PE:  BP (!) 149/80 (BP Location: Right Arm, Patient Position: Sitting, Cuff Size: Normal)   Pulse 65   Temp 97.7 F (36.5 C) (Oral)   Ht 5\' 11"  (1.803 m)   Wt 162 lb 9.6 oz (73.8 kg)   SpO2 97%   BMI 22.68 kg/m                 Constitutional: Pt appears in NAD               HENT: Head: NCAT.                Right Ear: External ear normal.                 Left Ear: External ear normal.                 Eyes: . Pupils are equal, round, and reactive to light. Conjunctivae and EOM are normal               Nose: without d/c or deformity               Neck: Neck supple. Gross normal ROM               Cardiovascular: Normal rate and regular rhythm.                 Pulmonary/Chest: Effort normal and breath sounds without rales or wheezing.                Abd:  Soft, NT, ND, + BS, no organomegaly               Neurological: Pt is alert. At baseline orientation, motor grossly intact               Skin: LE edema - none; left prox lateral index finger with 10 mm superfical wound with red,tender, slight swelling 1 cm around, also left axilla with post abscess firm subq kernal type lesion mild tender mobile, also low mid abd just right of midline suprapubic with 1 cm area very superficial wound but 1 cm induraation surrounding nonfluctuant appears to have drainged               Psychiatric: Pt behavior is normal without agitation   Micro: none  Cardiac tracings I have personally interpreted today:  none  Pertinent Radiological findings (summarize): none   Lab Results  Component Value Date   WBC 4.4 12/06/2020   HGB 14.8 12/06/2020   HCT 43.0 12/06/2020   PLT 266.0 12/06/2020   GLUCOSE 84 12/06/2020   CHOL 161 12/06/2020   TRIG 128.0 12/06/2020   HDL 57.00 12/06/2020   LDLCALC 78 12/06/2020   ALT 31 12/06/2020   AST 27 12/06/2020   NA 140 12/06/2020   K 3.9 12/06/2020   CL 99 12/06/2020   CREATININE 1.02 12/06/2020   BUN 13 12/06/2020   CO2 33 (H) 12/06/2020   TSH 2.17 12/06/2020   PSA 1.62 12/06/2020   HGBA1C  5.8 12/06/2020   Assessment/Plan:  Carl Trujillo is a 70 y.o. Black or African American [2] male with  has a past medical history of Anxiety, Arthritis of right knee, Arthritis of spine, ED (erectile dysfunction), and Hypertension.  Hyperglycemia Lab Results  Component Value Date   HGBA1C 5.8 12/06/2020   Stable, pt to continue current medical treatment  - diet   Encounter  for well adult exam with abnormal findings Age and sex appropriate education and counseling updated with regular exercise and diet Referrals for preventative services - declines colonoscopy Immunizations addressed - declines tdap or pneumovax Smoking counseling  - none needed Evidence for depression or other mood disorder - none significant Most recent labs reviewed. I have personally reviewed and have noted: 1) the patient's medical and social history 2) The patient's current medications and supplements 3) The patient's height, weight, and BMI have been recorded in the chart   Abscess of multiple sites Left axillan, low mid abd, and left prox index finger, presumed mrsa - for mupirocin nasal asd, doxy course, xray left hand/finger and MRI left finger r/o osteo  Essential hypertension BP Readings from Last 3 Encounters:  12/06/20 (!) 149/80  09/27/20 130/76  03/28/20 140/80   Mild increased today, pt declines med change, to continue medical treatment hyzaar and check bp at home and f/u mar 2022 as he plans to do   Current Outpatient Medications (Cardiovascular):  .  losartan-hydrochlorothiazide (HYZAAR) 100-25 MG tablet, Take 1 tablet by mouth daily. .  sildenafil (VIAGRA) 100 MG tablet, Take 1 tablet (100 mg total) by mouth daily as needed. Use as instructed  Current Outpatient Medications (Respiratory):  .  mupirocin nasal ointment (BACTROBAN) 2 %, Place 1 application into the nose 2 (two) times daily. Use one-half of tube in each nostril twice daily for five (5) days. After application, press sides of nose together and gently massage.  Current Outpatient Medications (Analgesics):  .  aspirin 81 MG tablet, Take 81 mg by mouth daily. .  diclofenac (VOLTAREN) 75 MG EC tablet, TAKE 1 TABLET BY MOUTH TWICE DAILY ALTERNATES  WITH  TYLENOL  ARTHRITIS  Current Outpatient Medications (Hematological):  .  vitamin B-12 (CYANOCOBALAMIN) 1000 MCG tablet, Take 1,000 mcg by mouth  daily.  Current Outpatient Medications (Other):  .  alprazolam (XANAX) 2 MG tablet, Take 1/2-1 tab po TID prn anxiety and insomnia .  Ascorbic Acid (VITAMIN C) 1000 MG tablet, Take 5,000 mg by mouth daily.  [name] 450 MG CAPS, Take by mouth. .  cholecalciferol (VITAMIN D) 1000 units tablet, Take 1,000 Units by mouth daily. .  Cod Liver Oil 1000 MG CAPS, Take by mouth. .  cyclobenzaprine (FLEXERIL) 10 MG tablet, Take 1 tablet by mouth twice daily as needed .  doxycycline (VIBRA-TABS) 100 MG tablet, Take 1 tablet (100 mg total) by mouth 2 (two) times daily for 14 days. .  Magnesium-Zinc 133.33-5 MG TABS, Take by mouth. .  Omega-3 Fatty Acids (OMEGA 3 PO), Take by mouth. Omega XL - 2 capsules daily .  pyridoxine (B-6) 100 MG tablet, Take 100 mg by mouth daily. [name]  Specialty Vitamins Products (CENTRUM PERFORMANCE) TABS, Take by mouth. .  traZODone (DESYREL) 100 MG tablet, Take 1 tablet (100 mg total) by mouth at bedtime.   Mixed hyperlipidemia Lab Results  Component Value Date   LDLCALC 78 12/06/2020   Stable, pt to continue current low chol diet   Followup: Return if symptoms worsen or fail to improve.  Oliver BarreJames , MD 12/09/2020 7:05 PM [facility]

## 2020-12-08 ENCOUNTER — Encounter: Payer: Self-pay | Admitting: Internal Medicine

## 2020-12-09 ENCOUNTER — Encounter: Payer: Self-pay | Admitting: Internal Medicine

## 2020-12-09 DIAGNOSIS — L0291 Cutaneous abscess, unspecified: Secondary | ICD-10-CM | POA: Insufficient documentation

## 2020-12-09 NOTE — Assessment & Plan Note (Addendum)
Age and sex appropriate education and counseling updated with regular exercise and diet Referrals for preventative services - declines colonoscopy Immunizations addressed - declines tdap or pneumovax Smoking counseling  - none needed Evidence for depression or other mood disorder - none significant Most recent labs reviewed. I have personally reviewed and have noted: 1) the patient's medical and social history 2) The patient's current medications and supplements 3) The patient's height, weight, and BMI have been recorded in the chart

## 2020-12-09 NOTE — Assessment & Plan Note (Addendum)
BP Readings from Last 3 Encounters:  12/06/20 (!) 149/80  09/27/20 130/76  03/28/20 140/80   Mild increased today, pt declines med change, to continue medical treatment hyzaar and check bp at home and f/u mar 2022 as he plans to do   Current Outpatient Medications (Cardiovascular):  .  losartan-hydrochlorothiazide (HYZAAR) 100-25 MG tablet, Take 1 tablet by mouth daily. .  sildenafil (VIAGRA) 100 MG tablet, Take 1 tablet (100 mg total) by mouth daily as needed. Use as instructed  Current Outpatient Medications (Respiratory):  .  mupirocin nasal ointment (BACTROBAN) 2 %, Place 1 application into the nose 2 (two) times daily. Use one-half of tube in each nostril twice daily for five (5) days. After application, press sides of nose together and gently massage.  Current Outpatient Medications (Analgesics):  .  aspirin 81 MG tablet, Take 81 mg by mouth daily. .  diclofenac (VOLTAREN) 75 MG EC tablet, TAKE 1 TABLET BY MOUTH TWICE DAILY ALTERNATES  WITH  TYLENOL  ARTHRITIS  Current Outpatient Medications (Hematological):  .  vitamin B-12 (CYANOCOBALAMIN) 1000 MCG tablet, Take 1,000 mcg by mouth daily.  Current Outpatient Medications (Other):  .  alprazolam (XANAX) 2 MG tablet, Take 1/2-1 tab po TID prn anxiety and insomnia .  Ascorbic Acid (VITAMIN C) 1000 MG tablet, Take 5,000 mg by mouth daily.  [name] 450 MG CAPS, Take by mouth. .  cholecalciferol (VITAMIN D) 1000 units tablet, Take 1,000 Units by mouth daily. .  Cod Liver Oil 1000 MG CAPS, Take by mouth. .  cyclobenzaprine (FLEXERIL) 10 MG tablet, Take 1 tablet by mouth twice daily as needed .  doxycycline (VIBRA-TABS) 100 MG tablet, Take 1 tablet (100 mg total) by mouth 2 (two) times daily for 14 days. .  Magnesium-Zinc 133.33-5 MG TABS, Take by mouth. .  Omega-3 Fatty Acids (OMEGA 3 PO), Take by mouth. Omega XL - 2 capsules daily .  pyridoxine (B-6) 100 MG tablet, Take 100 mg by mouth daily. [name]  Specialty Vitamins Products  (CENTRUM PERFORMANCE) TABS, Take by mouth. .  traZODone (DESYREL) 100 MG tablet, Take 1 tablet (100 mg total) by mouth at bedtime.

## 2020-12-09 NOTE — Assessment & Plan Note (Signed)
Left axillan, low mid abd, and left prox index finger, presumed mrsa - for mupirocin nasal asd, doxy course, xray left hand/finger and MRI left finger r/o osteo

## 2020-12-09 NOTE — Assessment & Plan Note (Signed)
Lab Results  °Component Value Date  ° HGBA1C 5.8 12/06/2020  ° °Stable, pt to continue current medical treatment  - diet ° °

## 2020-12-09 NOTE — Assessment & Plan Note (Signed)
Lab Results  °Component Value Date  ° LDLCALC 78 12/06/2020  ° °Stable, pt to continue current low chol diet ° °

## 2020-12-12 ENCOUNTER — Telehealth: Payer: Self-pay | Admitting: Internal Medicine

## 2020-12-12 DIAGNOSIS — L02512 Cutaneous abscess of left hand: Secondary | ICD-10-CM

## 2020-12-12 DIAGNOSIS — M869 Osteomyelitis, unspecified: Secondary | ICD-10-CM

## 2020-12-12 NOTE — Telephone Encounter (Signed)
Patient calling for status of updated MRI  order

## 2020-12-12 NOTE — Telephone Encounter (Signed)
Ok done

## 2020-12-12 NOTE — Telephone Encounter (Signed)
[facility] needs updated MRI to W/WO

## 2020-12-24 ENCOUNTER — Other Ambulatory Visit: Payer: Self-pay | Admitting: Internal Medicine

## 2020-12-24 DIAGNOSIS — G8929 Other chronic pain: Secondary | ICD-10-CM

## 2020-12-24 DIAGNOSIS — I10 Essential (primary) hypertension: Secondary | ICD-10-CM

## 2020-12-24 DIAGNOSIS — M25562 Pain in left knee: Secondary | ICD-10-CM

## 2020-12-26 ENCOUNTER — Ambulatory Visit: Payer: Medicare HMO | Admitting: Internal Medicine

## 2020-12-31 ENCOUNTER — Ambulatory Visit
Admission: RE | Admit: 2020-12-31 | Discharge: 2020-12-31 | Disposition: A | Discharge status: Home / Self Care | Payer: Medicare HMO | Source: Ambulatory Visit | Attending: Internal Medicine | Admitting: Internal Medicine

## 2020-12-31 DIAGNOSIS — S61402D Unspecified open wound of left hand, subsequent encounter: Secondary | ICD-10-CM | POA: Diagnosis not present

## 2020-12-31 DIAGNOSIS — M869 Osteomyelitis, unspecified: Secondary | ICD-10-CM

## 2020-12-31 MED ORDER — GADOBENATE DIMEGLUMINE 529 MG/ML IV SOLN
15.0000 mL | Freq: Once | INTRAVENOUS | Status: AC | PRN
  Administered 2020-12-31: 15 mL via INTRAVENOUS

## 2021-01-01 ENCOUNTER — Ambulatory Visit (INDEPENDENT_AMBULATORY_CARE_PROVIDER_SITE_OTHER): Payer: Medicare HMO | Admitting: Internal Medicine

## 2021-01-01 ENCOUNTER — Encounter: Payer: Self-pay | Admitting: Internal Medicine

## 2021-01-01 ENCOUNTER — Other Ambulatory Visit: Payer: Self-pay

## 2021-01-01 VITALS — BP 112/76 | HR 74 | Temp 98.3°F | Ht 71.0 in | Wt 163.0 lb

## 2021-01-01 DIAGNOSIS — I10 Essential (primary) hypertension: Secondary | ICD-10-CM

## 2021-01-01 DIAGNOSIS — E782 Mixed hyperlipidemia: Secondary | ICD-10-CM

## 2021-01-01 DIAGNOSIS — L0291 Cutaneous abscess, unspecified: Secondary | ICD-10-CM

## 2021-01-01 DIAGNOSIS — R739 Hyperglycemia, unspecified: Secondary | ICD-10-CM | POA: Diagnosis not present

## 2021-01-01 NOTE — Progress Notes (Signed)
Patient ID: Carl Trujillo, male   DOB: 1951-09-28, 70 y.o.   MRN: [id number]        Chief Complaint: follow up recent multiple abscesses, htn, hyperglycemia, hld, left hand abscess       HPI:  Sameul Trujillo is a 70 y.o. male here to f/u, overall doing well with all evidence for hand and other infectious loci resolved with 14 days doxy and mupirocin nasal ointment.  Pt quite pleased, "I feel great".  MRI left hand resulted just today - neg for osteo and wound now healed.  Pt denies chest pain, increased sob or doe, wheezing, orthopnea, PND, increased LE swelling, palpitations, dizziness or syncope.  Denies new focal neuro s/s.   Pt denies polydipsia, polyuria,  Pt denies fever, wt loss, night sweats, loss of appetite, or other constitutional symptoms      Wt Readings from Last 3 Encounters:  01/01/21 163 lb (73.9 kg)  12/06/20 162 lb 9.6 oz (73.8 kg)  09/27/20 163 lb 3.2 oz (74 kg)   BP Readings from Last 3 Encounters:  01/01/21 112/76  12/06/20 (!) 149/80  09/27/20 130/76         Past Medical History:  Diagnosis Date  . Anxiety   . Arthritis of right knee   . Arthritis of spine   . ED (erectile dysfunction)   . Hypertension    History reviewed. No pertinent surgical history.  reports that he quit smoking about 23 years ago. His smoking use included cigars. He quit after 23.00 years of use. He has never used smokeless tobacco. He reports current alcohol use of about 3.0 standard drinks of alcohol per week. He reports that he does not use drugs. family history includes CVA in his sister; Cancer in his mother and another family member; Coronary artery disease in an other family member; Post-traumatic stress disorder in his brother. No Known Allergies Current Outpatient Medications on File Prior to Visit  Medication Sig Dispense Refill  . alprazolam (XANAX) 2 MG tablet Take 1/2-1 tab po TID prn anxiety and insomnia 80 tablet 5  . Ascorbic Acid (VITAMIN C) 1000 MG tablet Take 5,000 mg by  mouth daily.     [name] aspirin 81 MG tablet Take 81 mg by mouth daily.    [name] 450 MG CAPS Take by mouth.    . cholecalciferol (VITAMIN D) 1000 units tablet Take 1,000 Units by mouth daily.    [name] Cod Liver Oil 1000 MG CAPS Take by mouth.    . cyclobenzaprine (FLEXERIL) 10 MG tablet Take 1 tablet by mouth twice daily as needed 60 tablet 0  . diclofenac (VOLTAREN) 75 MG EC tablet TAKE 1 TABLET BY MOUTH TWICE DAILY ALTERNATES  WITH  TYLENOL  ARTHRITIS 60 tablet 0  . losartan-hydrochlorothiazide (HYZAAR) 100-25 MG tablet Take 1 tablet by mouth once daily 90 tablet 3  . Magnesium-Zinc 133.33-5 MG TABS Take by mouth.    . mupirocin nasal ointment (BACTROBAN) 2 % Place 1 application into the nose 2 (two) times daily. Use one-half of tube in each nostril twice daily for five (5) days. After application, press sides of nose together and gently massage. 10 g 0  . Omega-3 Fatty Acids (OMEGA 3 PO) Take by mouth. Omega XL - 2 capsules daily    . pyridoxine (B-6) 100 MG tablet Take 100 mg by mouth daily.    . sildenafil (VIAGRA) 100 MG tablet Take 1 tablet (100 mg total) by mouth daily as needed. Use as  instructed 5 tablet 11  . Specialty Vitamins Products (CENTRUM PERFORMANCE) TABS Take by mouth.    . traZODone (DESYREL) 100 MG tablet Take 1 tablet (100 mg total) by mouth at bedtime. 90 tablet 1  . vitamin B-12 (CYANOCOBALAMIN) 1000 MCG tablet Take 1,000 mcg by mouth daily.     No current facility-administered medications on file prior to visit.        ROS:  All others reviewed and negative.  Objective        PE:  BP 112/76   Pulse 74   Temp 98.3 F (36.8 C) (Oral)   Ht 5\' 11"  (1.803 m)   Wt 163 lb (73.9 kg)   SpO2 96%   BMI 22.73 kg/m                 Constitutional: Pt appears in NAD               HENT: Head: NCAT.                Right Ear: External ear normal.                 Left Ear: External ear normal.                Eyes: . Pupils are equal, round, and reactive to light.  Conjunctivae and EOM are normal               Nose: without d/c or deformity               Neck: Neck supple. Gross normal ROM               Cardiovascular: Normal rate and regular rhythm.                 Pulmonary/Chest: Effort normal and breath sounds without rales or wheezing.                Abd:  Soft, NT, ND, + BS, no organomegaly               Neurological: Pt is alert. At baseline orientation, motor grossly intact               Skin: Skin is warm. No rashes, no other new lesions, LE edema - none               Psychiatric: Pt behavior is normal without agitation   Micro: none  Cardiac tracings I have personally interpreted today:  none  Pertinent Radiological findings (summarize): 01/10/21 MRI left hand IMPRESSION: 1. Superficial soft tissue swelling and edema along the radial aspect of the second MCP joint. No organized or rim enhancing fluid collection. 2. No evidence of osteomyelitis. 3. Mild osteoarthritis of the left hand most pronounced at the first [facility] and first MCP joints.   Lab Results  Component Value Date   WBC 4.4 12/06/2020   HGB 14.8 12/06/2020   HCT 43.0 12/06/2020   PLT 266.0 12/06/2020   GLUCOSE 84 12/06/2020   CHOL 161 12/06/2020   TRIG 128.0 12/06/2020   HDL 57.00 12/06/2020   LDLCALC 78 12/06/2020   ALT 31 12/06/2020   AST 27 12/06/2020   NA 140 12/06/2020   K 3.9 12/06/2020   CL 99 12/06/2020   CREATININE 1.02 12/06/2020   BUN 13 12/06/2020   CO2 33 (H) 12/06/2020   TSH 2.17 12/06/2020   PSA 1.62 12/06/2020   HGBA1C 5.8 12/06/2020   Assessment/Plan:  Carl Trujillo is a 70 y.o. Black or African American [2] male with  has a past medical history of Anxiety, Arthritis of right knee, Arthritis of spine, ED (erectile dysfunction), and Hypertension.  Abscess of multiple sites Resolved, recent MRI left hand neg for acute,  to f/u any worsening symptoms or concerns  Essential hypertension BP Readings from Last 3 Encounters:  01/01/21 112/76   12/06/20 (!) 149/80  09/27/20 130/76   Improved, now stable pt to continue medical treatment hyzaar    Hyperglycemia Lab Results  Component Value Date   HGBA1C 5.8 12/06/2020   Stable, pt to continue current medical treatment  - diet, wt control   Mixed hyperlipidemia Lab Results  Component Value Date   LDLCALC 78 12/06/2020   Stable, pt to continue current statin  - diet only, declines statin   Followup: No follow-ups on file.  Oliver Barre, MD 01/01/2021 10:11 PM [facility]

## 2021-01-01 NOTE — Assessment & Plan Note (Signed)
Lab Results  Component Value Date   HGBA1C 5.8 12/06/2020   Stable, pt to continue current medical treatment  - diet, wt control

## 2021-01-01 NOTE — Assessment & Plan Note (Addendum)
BP Readings from Last 3 Encounters:  01/01/21 112/76  12/06/20 (!) 149/80  09/27/20 130/76   Improved, now stable pt to continue medical treatment hyzaar

## 2021-01-01 NOTE — Patient Instructions (Signed)
Please continue all other medications as before, and refills have been done if requested.  Please have the pharmacy call with any other refills you may need.  Please continue your efforts at being more active, low cholesterol diet, and weight control.  Please keep your appointments with your specialists as you may have planned  Please make an Appointment to return in 6 months, or sooner if needed 

## 2021-01-01 NOTE — Assessment & Plan Note (Signed)
Lab Results  Component Value Date   LDLCALC 78 12/06/2020   Stable, pt to continue current statin  - diet only, declines statin

## 2021-01-01 NOTE — Assessment & Plan Note (Signed)
Resolved, recent MRI left hand neg for acute,  to f/u any worsening symptoms or concerns

## 2021-01-03 ENCOUNTER — Telehealth: Payer: Self-pay | Admitting: Internal Medicine

## 2021-01-03 NOTE — Telephone Encounter (Signed)
Call pharmacy to confirm meds where refilled on 12/24/20

## 2021-01-03 NOTE — Telephone Encounter (Signed)
diclofenac (VOLTAREN) 75 MG EC tablet cyclobenzaprine (FLEXERIL) 10 MG tablet losartan-hydrochlorothiazide (HYZAAR) 100-25 MG tablet traZODone (DESYREL) 100 MG tablet [facility] [place], [place] - [address] Phone:  [phone]  Fax:  [phone]     Patient requesting a refill, states he forgot to mention it during his visit. Last seen- 03.22.22 Next apt- n/a

## 2021-02-04 ENCOUNTER — Other Ambulatory Visit: Payer: Self-pay | Admitting: Internal Medicine

## 2021-02-04 DIAGNOSIS — M25562 Pain in left knee: Secondary | ICD-10-CM

## 2021-02-04 DIAGNOSIS — G8929 Other chronic pain: Secondary | ICD-10-CM

## 2021-02-15 ENCOUNTER — Other Ambulatory Visit: Payer: Self-pay

## 2021-02-15 ENCOUNTER — Ambulatory Visit (INDEPENDENT_AMBULATORY_CARE_PROVIDER_SITE_OTHER): Payer: Medicare HMO | Admitting: Psychiatry

## 2021-02-15 ENCOUNTER — Encounter: Payer: Self-pay | Admitting: Psychiatry

## 2021-02-15 DIAGNOSIS — F411 Generalized anxiety disorder: Secondary | ICD-10-CM | POA: Diagnosis not present

## 2021-02-15 MED ORDER — ALPRAZOLAM 2 MG PO TABS
ORAL_TABLET | ORAL | 5 refills | Status: DC
Start: 2021-02-15 — End: 2021-08-15

## 2021-02-15 NOTE — Progress Notes (Signed)
Hillman Attig [id number] 1951-02-20 70 y.o.  Subjective:   Patient ID:  Carl Trujillo is a 70 y.o. (DOB [birth year]) male.  Chief Complaint:  Chief Complaint  Patient presents with  . Follow-up    Anxiety    HPI Carl Trujillo presents to the office today for follow-up of anxiety. He denies anxiety. Mood has been stable. Sleeping well. He averages 10-12 hours of sleep a night. He reports that he typically stays home. He prefers to cook his food at home and eats mostly vegetables, fruits, and lean meats. Appetite has been very good. Energy has been good. He washed and waxed his car last week. He reports adequate concentration. He reports that he manages his finances and takes care of his home to include laundry, cooking, etc. Denies SI.   He reports that sleep is improved with taking Xanax before bedtime. He reports that he takes a 1/4-1/2 tablet of Trazodone since he had vivid dreams with higher dose of Trazodone.   He reports that he spends time with his 4 brothers. Oldest brother will be 97 yo. He has 2 younger brothers. He was one of 12 children. Has 2 living sisters. He reports that he has been talking with several women.  [place] stopped [place]ering [place] [employer] due to concerns about COVID exposure.    Mini-Mental   Flowsheet Row Clinical Support from 03/17/2018 in [facility]  Total Score (max 30 points ) 29    PHQ2-9   Flowsheet Row Office Visit from 12/06/2020 in [facility] Clinical Support from 03/28/2020 in [facility] Visit from 12/21/2019 in [facility] Clinical Support from 03/17/2018 in [facility] Office Visit from 07/03/2017 in [facility]  PHQ-2 Total Score 0 0 1 0 0  PHQ-9 Total Score -- -- -- 0 --       Review of Systems:  Review of Systems  Musculoskeletal: Negative for gait problem.  Neurological: Negative for tremors.   Psychiatric/Behavioral:       Please refer to HPI    Medications: I have reviewed the patient's current medications.  Current Outpatient Medications  Medication Sig Dispense Refill  . alprazolam (XANAX) 2 MG tablet Take 1/2-1 tab po TID prn anxiety and insomnia 80 tablet 5  . Ascorbic Acid (VITAMIN C) 1000 MG tablet Take 5,000 mg by mouth daily.     [name] aspirin 81 MG tablet Take 81 mg by mouth daily.    [name] 450 MG CAPS Take by mouth.    . cholecalciferol (VITAMIN D) 1000 units tablet Take 1,000 Units by mouth daily.    [name] Cod Liver Oil 1000 MG CAPS Take by mouth.    [name] COVID-19 mRNA vaccine, Pfizer, 30 MCG/0.3ML injection TO BE ADMINISTERED BY PHARMACIST .3 mL 0  . cyclobenzaprine (FLEXERIL) 10 MG tablet Take 1 tablet by mouth twice daily as needed 60 tablet 0  . diclofenac (VOLTAREN) 75 MG EC tablet TAKE 1 TABLET BY MOUTH TWICE DAILY ALTERNATES  WITH  TYLENOL  ARTHRITIS 60 tablet 0  . losartan-hydrochlorothiazide (HYZAAR) 100-25 MG tablet Take 1 tablet by mouth once daily 90 tablet 3  . Magnesium-Zinc 133.33-5 MG TABS Take by mouth.    . mupirocin nasal ointment (BACTROBAN) 2 % Place 1 application into the nose 2 (two) times daily. Use one-half of tube in each nostril twice daily for five (5) days. After application, press sides of  nose together and gently massage. 10 g 0  . Omega-3 Fatty Acids (OMEGA 3 PO) Take by mouth. Omega XL - 2 capsules daily    . pyridoxine (B-6) 100 MG tablet Take 100 mg by mouth daily.    . sildenafil (VIAGRA) 100 MG tablet Take 1 tablet (100 mg total) by mouth daily as needed. Use as instructed 5 tablet 11  . Specialty Vitamins Products (CENTRUM PERFORMANCE) TABS Take by mouth.    . traZODone (DESYREL) 100 MG tablet Take 1 tablet (100 mg total) by mouth at bedtime. 90 tablet 1  . vitamin B-12 (CYANOCOBALAMIN) 1000 MCG tablet Take 1,000 mcg by mouth daily.     No current facility-administered medications for this visit.    Medication Side Effects:  None  Allergies: No Known Allergies  Past Medical History:  Diagnosis Date  . Anxiety   . Arthritis of right knee   . Arthritis of spine   . ED (erectile dysfunction)   . Hypertension     Past Medical History, Surgical history, Social history, and Family history were reviewed and updated as appropriate.   Please see review of systems for further details on the patient's review from today.   Objective:   Physical Exam:  Wt 160 lb (72.6 kg)   BMI 22.32 kg/m   Physical Exam Constitutional:      General: He is not in acute distress. Musculoskeletal:        General: No deformity.  Neurological:     Mental Status: He is alert and oriented to person, place, and time.     Coordination: Coordination normal.  Psychiatric:        Attention and Perception: Attention and perception normal. He does not perceive auditory or visual hallucinations.        Mood and Affect: Mood normal. Mood is not anxious or depressed. Affect is not labile, blunt, angry or inappropriate.        Speech: Speech normal.        Behavior: Behavior normal.        Thought Content: Thought content normal. Thought content is not paranoid or delusional. Thought content does not include homicidal or suicidal ideation. Thought content does not include homicidal or suicidal plan.        Cognition and Memory: Cognition and memory normal.        Judgment: Judgment normal.     Comments: Insight intact     Lab Review:     Component Value Date/Time   NA 140 12/06/2020 1250   K 3.9 12/06/2020 1250   CL 99 12/06/2020 1250   CO2 33 (H) 12/06/2020 1250   GLUCOSE 84 12/06/2020 1250   BUN 13 12/06/2020 1250   CREATININE 1.02 12/06/2020 1250   CALCIUM 9.4 12/06/2020 1250   PROT 7.1 12/06/2020 1250   ALBUMIN 4.4 12/06/2020 1250   AST 27 12/06/2020 1250   ALT 31 12/06/2020 1250   ALKPHOS 75 12/06/2020 1250   BILITOT 1.1 12/06/2020 1250   GFRNONAA 88.07 11/14/2009 1036       Component Value Date/Time   WBC 4.4  12/06/2020 1250   RBC 4.61 12/06/2020 1250   HGB 14.8 12/06/2020 1250   HCT 43.0 12/06/2020 1250   PLT 266.0 12/06/2020 1250   MCV 93.4 12/06/2020 1250   MCHC 34.4 12/06/2020 1250   RDW 13.1 12/06/2020 1250   LYMPHSABS 1.0 12/06/2020 1250   MONOABS 0.5 12/06/2020 1250   EOSABS 0.1 12/06/2020 1250   BASOSABS 0.0 12/06/2020  1250    No results found for: POCLITH, LITHIUM   No results found for: PHENYTOIN, PHENOBARB, VALPROATE, CBMZ   .res Assessment: Plan:   Will continue Xanax 2 mg 1/2-1 tablet po TID prn anxiety and insomnia. Pt to follow-up in 6 months or sooner if clinically indicated.  Patient advised to contact office with any questions, adverse effects, or acute worsening in signs and symptoms.   Carl Trujillo was seen today for follow-up.  Diagnoses and all orders for this visit:  Anxiety state -     alprazolam (XANAX) 2 MG tablet; Take 1/2-1 tab po TID prn anxiety and insomnia     Please see After Visit Summary for patient specific instructions.  Future Appointments  Date Time Provider Department Center  08/16/2021 11:00 AM Corie Chiquito, PMHNP [facility] None    No orders of the defined types were placed in this encounter.   -------------------------------

## 2021-04-10 ENCOUNTER — Other Ambulatory Visit: Payer: Self-pay

## 2021-04-10 ENCOUNTER — Encounter: Payer: Self-pay | Admitting: Internal Medicine

## 2021-04-10 ENCOUNTER — Ambulatory Visit (INDEPENDENT_AMBULATORY_CARE_PROVIDER_SITE_OTHER): Payer: Medicare HMO | Admitting: Internal Medicine

## 2021-04-10 VITALS — BP 138/70 | HR 62 | Temp 97.7°F | Ht 71.0 in | Wt 164.0 lb

## 2021-04-10 DIAGNOSIS — M25561 Pain in right knee: Secondary | ICD-10-CM | POA: Diagnosis not present

## 2021-04-10 DIAGNOSIS — I10 Essential (primary) hypertension: Secondary | ICD-10-CM

## 2021-04-10 DIAGNOSIS — R739 Hyperglycemia, unspecified: Secondary | ICD-10-CM

## 2021-04-10 DIAGNOSIS — E782 Mixed hyperlipidemia: Secondary | ICD-10-CM

## 2021-04-10 DIAGNOSIS — M5442 Lumbago with sciatica, left side: Secondary | ICD-10-CM | POA: Diagnosis not present

## 2021-04-10 DIAGNOSIS — G8929 Other chronic pain: Secondary | ICD-10-CM | POA: Diagnosis not present

## 2021-04-10 DIAGNOSIS — M25562 Pain in left knee: Secondary | ICD-10-CM | POA: Diagnosis not present

## 2021-04-10 MED ORDER — CYCLOBENZAPRINE HCL 10 MG PO TABS: 10.0000 mg | ORAL_TABLET | Freq: Two times a day (BID) | ORAL | 11 refills | Status: DC | PRN

## 2021-04-10 MED ORDER — DICLOFENAC SODIUM 75 MG PO TBEC
DELAYED_RELEASE_TABLET | ORAL | 11 refills | Status: DC
Start: 2021-04-10 — End: 2021-12-26

## 2021-04-10 NOTE — Patient Instructions (Signed)
Please continue all other medications as before, and refills have been done if requested.  Please have the pharmacy call with any other refills you may need.  Please continue your efforts at being more active, low cholesterol diet, and weight control  Please keep your appointments with your specialists as you may have planned  Please return after Oct 13 2021 for your yearly exam

## 2021-04-10 NOTE — Progress Notes (Signed)
Patient ID: Carl Trujillo, male   DOB: 1951-10-03, 70 y.o.   MRN: [id number]        Chief Complaint: follow up HTN, HLD and hyperglycemia       HPI:  Carl Trujillo is a 70 y.o. male here overall doing ok, Pt denies chest pain, increased sob or doe, wheezing, orthopnea, PND, increased LE swelling, palpitations, dizziness or syncope.   Pt denies polydipsia, polyuria, or new focal neuro s/s.    Pt denies fever, wt loss, night sweats, loss of appetite, or other constitutional symptoms   Denies worsening depressive symptoms, suicidal ideation, or panic; has ongoing anxiety, not increased recently.   No other new complaints   Pt continues to have recurring LBP without change in severity, bowel or bladder change, fever, wt loss,  worsening LE pain/numbness/weakness, gait change or falls.  Wt Readings from Last 3 Encounters:  04/10/21 164 lb (74.4 kg)  01/01/21 163 lb (73.9 kg)  12/06/20 162 lb 9.6 oz (73.8 kg)   BP Readings from Last 3 Encounters:  04/10/21 138/70  01/01/21 112/76  12/06/20 (!) 149/80         Past Medical History:  Diagnosis Date   Anxiety    Arthritis of right knee    Arthritis of spine    ED (erectile dysfunction)    Hypertension    History reviewed. No pertinent surgical history.  reports that he quit smoking about 23 years ago. His smoking use included cigars. He has never used smokeless tobacco. He reports current alcohol use of about 3.0 standard drinks of alcohol per week. He reports that he does not use drugs. family history includes CVA in his sister; Cancer in his mother and another family member; Coronary artery disease in an other family member; Post-traumatic stress disorder in his brother. No Known Allergies Current Outpatient Medications on File Prior to Visit  Medication Sig Dispense Refill   alprazolam (XANAX) 2 MG tablet Take 1/2-1 tab po TID prn anxiety and insomnia 80 tablet 5   Ascorbic Acid (VITAMIN C) 1000 MG tablet Take 5,000 mg by mouth daily.       aspirin 81 MG tablet Take 81 mg by mouth daily.     Cascara Sagrada 450 MG CAPS Take by mouth.     cholecalciferol (VITAMIN D) 1000 units tablet Take 1,000 Units by mouth daily.     Cod Liver Oil 1000 MG CAPS Take by mouth.     losartan-hydrochlorothiazide (HYZAAR) 100-25 MG tablet Take 1 tablet by mouth once daily 90 tablet 3   Magnesium-Zinc 133.33-5 MG TABS Take by mouth.     mupirocin nasal ointment (BACTROBAN) 2 % Place 1 application into the nose 2 (two) times daily. Use one-half of tube in each nostril twice daily for five (5) days. After application, press sides of nose together and gently massage. 10 g 0   Omega-3 Fatty Acids (OMEGA 3 PO) Take by mouth. Omega XL - 2 capsules daily     pyridoxine (B-6) 100 MG tablet Take 100 mg by mouth daily.     sildenafil (VIAGRA) 100 MG tablet Take 1 tablet (100 mg total) by mouth daily as needed. Use as instructed 5 tablet 11   Specialty Vitamins Products (CENTRUM PERFORMANCE) TABS Take by mouth.     traZODone (DESYREL) 100 MG tablet Take 1 tablet (100 mg total) by mouth at bedtime. 90 tablet 1   vitamin B-12 (CYANOCOBALAMIN) 1000 MCG tablet Take 1,000 mcg by mouth daily.  No current facility-administered medications on file prior to visit.        ROS:  All others reviewed and negative.  Objective        PE:  BP 138/70 (BP Location: Right Arm, Patient Position: Sitting, Cuff Size: Large)   Pulse 62   Temp 97.7 F (36.5 C) (Oral)   Ht 5\' 11"  (1.803 m)   Wt 164 lb (74.4 kg)   SpO2 95%   BMI 22.87 kg/m                 Constitutional: Pt appears in NAD               HENT: Head: NCAT.                Right Ear: External ear normal.                 Left Ear: External ear normal.                Eyes: . Pupils are equal, round, and reactive to light. Conjunctivae and EOM are normal               Nose: without d/c or deformity               Neck: Neck supple. Gross normal ROM               Cardiovascular: Normal rate and regular rhythm.                  Pulmonary/Chest: Effort normal and breath sounds without rales or wheezing.                Abd:  Soft, NT, ND, + BS, no organomegaly               Neurological: Pt is alert. At baseline orientation, motor grossly intact               Skin: Skin is warm. No rashes, no other new lesions, LE edema - none               Psychiatric: Pt behavior is normal without agitation   Micro: none  Cardiac tracings I have personally interpreted today:  none  Pertinent Radiological findings (summarize): none   Lab Results  Component Value Date   WBC 4.4 12/06/2020   HGB 14.8 12/06/2020   HCT 43.0 12/06/2020   PLT 266.0 12/06/2020   GLUCOSE 84 12/06/2020   CHOL 161 12/06/2020   TRIG 128.0 12/06/2020   HDL 57.00 12/06/2020   LDLCALC 78 12/06/2020   ALT 31 12/06/2020   AST 27 12/06/2020   NA 140 12/06/2020   K 3.9 12/06/2020   CL 99 12/06/2020   CREATININE 1.02 12/06/2020   BUN 13 12/06/2020   CO2 33 (H) 12/06/2020   TSH 2.17 12/06/2020   PSA 1.62 12/06/2020   HGBA1C 5.8 12/06/2020   Assessment/Plan:  Carl Trujillo is a 70 y.o. Black or African American [2] male with  has a past medical history of Anxiety, Arthritis of right knee, Arthritis of spine, ED (erectile dysfunction), and Hypertension.  Essential hypertension BP Readings from Last 3 Encounters:  04/10/21 138/70  01/01/21 112/76  12/06/20 (!) 149/80   Stable, pt to continue medical treatment hyzaar   Hyperglycemia Lab Results  Component Value Date   HGBA1C 5.8 12/06/2020   Stable, pt to continue current medical treatment  - diet   Mixed hyperlipidemia Lab Results  Component Value Date   LDLCALC 78 12/06/2020   Stable, pt to continue current low chol diet, with goal ldl < 70   LOW BACK PAIN, CHRONIC Overall stable, cont nsaid bid prn,  to f/u any worsening symptoms or concerns  Knee pain Chronic mild intermittent, ok for volt gel prn  Followup: Return in about 6 months (around 10/21/2021).  Oliver Barre, MD 04/14/2021 1:27 PM [facility]

## 2021-04-14 ENCOUNTER — Encounter: Payer: Self-pay | Admitting: Internal Medicine

## 2021-04-14 NOTE — Assessment & Plan Note (Signed)
Chronic mild intermittent, ok for volt gel prn

## 2021-04-14 NOTE — Assessment & Plan Note (Signed)
Overall stable, cont nsaid bid prn,  to f/u any worsening symptoms or concerns

## 2021-04-14 NOTE — Assessment & Plan Note (Signed)
Lab Results  Component Value Date   LDLCALC 78 12/06/2020   Stable, pt to continue current low chol diet, with goal ldl < 70

## 2021-04-14 NOTE — Assessment & Plan Note (Signed)
BP Readings from Last 3 Encounters:  04/10/21 138/70  01/01/21 112/76  12/06/20 (!) 149/80   Stable, pt to continue medical treatment hyzaar

## 2021-04-14 NOTE — Assessment & Plan Note (Signed)
Lab Results  °Component Value Date  ° HGBA1C 5.8 12/06/2020  ° °Stable, pt to continue current medical treatment  - diet ° °

## 2021-06-25 DIAGNOSIS — Z20822 Contact with and (suspected) exposure to covid-19: Secondary | ICD-10-CM | POA: Diagnosis not present

## 2021-07-10 ENCOUNTER — Telehealth: Payer: Self-pay | Admitting: Internal Medicine

## 2021-07-10 NOTE — Telephone Encounter (Signed)
Left message for patient to call me back at [phone] to schedule Medicare Annual Wellness Visit   Last AWV  03/28/20  Please schedule at anytime with [facility] Health Advisor if patient calls the office back.    40 Minutes appointment   Any questions, please call me at [phone]  

## 2021-07-11 ENCOUNTER — Emergency Department (HOSPITAL_COMMUNITY)
Admission: EM | Admit: 2021-07-11 | Discharge: 2021-07-11 | Disposition: A | Discharge status: Home / Self Care | Payer: Medicare HMO | Attending: Emergency Medicine | Admitting: Emergency Medicine

## 2021-07-11 ENCOUNTER — Other Ambulatory Visit: Payer: Self-pay

## 2021-07-11 ENCOUNTER — Encounter (HOSPITAL_COMMUNITY): Payer: Self-pay | Admitting: Emergency Medicine

## 2021-07-11 DIAGNOSIS — R0981 Nasal congestion: Secondary | ICD-10-CM | POA: Insufficient documentation

## 2021-07-11 DIAGNOSIS — Z20822 Contact with and (suspected) exposure to covid-19: Secondary | ICD-10-CM | POA: Diagnosis not present

## 2021-07-11 DIAGNOSIS — Z5321 Procedure and treatment not carried out due to patient leaving prior to being seen by health care provider: Secondary | ICD-10-CM | POA: Insufficient documentation

## 2021-07-11 LAB — RESP PANEL BY RT-PCR (FLU A&B, COVID) ARPGX2
Influenza A by PCR: NEGATIVE
Influenza B by PCR: NEGATIVE
SARS Coronavirus 2 by RT PCR: NEGATIVE

## 2021-07-11 NOTE — ED Triage Notes (Signed)
Patient complains of nasal congestion and sinus pressure that started approximately ten days ago. Patient states he has tried Geneticist, molecular and several OTC medications for flu symptoms but with minimal relief. Patient denies trying antihistamines. Patient denies fevers.

## 2021-07-11 NOTE — ED Provider Notes (Signed)
Emergency Medicine Provider Triage Evaluation Note  Carl Trujillo , a 70 y.o. male  was evaluated in triage.  Pt complains of nasal congestion over the last 10 days.  He is taken over-the-counter Mucinex, decongestants, and hot tea with honey without any improvement.  He denies any sore throat, fever, chills, cough, chest pain, shortness of breath.  Would like to get tested for COVID.  Review of Systems  Positive:  Negative: See above  Physical Exam  BP (!) 149/93 (BP Location: Right Arm)   Pulse 65   Temp 98.2 F (36.8 C) (Oral)   Resp 16   SpO2 98%  Gen:   Awake, no distress   Resp:  Normal effort  MSK:   Moves extremities without difficulty  Other:    Medical Decision Making  Medically screening exam initiated at 5:34 PM.  Appropriate orders placed.  Carl Trujillo was informed that the remainder of the evaluation will be completed by another provider, this initial triage assessment does not replace that evaluation, and the importance of remaining in the ED until their evaluation is complete.     Honor Loh [place], PA-C 07/11/21 1735    Benjiman Core, MD 07/11/21 2226

## 2021-07-29 ENCOUNTER — Ambulatory Visit (HOSPITAL_COMMUNITY)
Admission: EM | Admit: 2021-07-29 | Discharge: 2021-07-29 | Disposition: A | Discharge status: Home / Self Care | Payer: Medicare HMO | Attending: Emergency Medicine | Admitting: Emergency Medicine

## 2021-07-29 ENCOUNTER — Other Ambulatory Visit: Payer: Self-pay

## 2021-07-29 ENCOUNTER — Encounter (HOSPITAL_COMMUNITY): Payer: Self-pay | Admitting: Emergency Medicine

## 2021-07-29 DIAGNOSIS — B9689 Other specified bacterial agents as the cause of diseases classified elsewhere: Secondary | ICD-10-CM | POA: Diagnosis not present

## 2021-07-29 DIAGNOSIS — J019 Acute sinusitis, unspecified: Secondary | ICD-10-CM

## 2021-07-29 DIAGNOSIS — I10 Essential (primary) hypertension: Secondary | ICD-10-CM

## 2021-07-29 MED ORDER — AMOXICILLIN-POT CLAVULANATE 875-125 MG PO TABS: 1.0000 | ORAL_TABLET | Freq: Two times a day (BID) | ORAL | 0 refills | Status: DC

## 2021-07-29 NOTE — ED Provider Notes (Signed)
[facility]    CSN: [id number] Arrival date & time: 07/29/21  1129      History   Chief Complaint Chief Complaint  Patient presents with  . Nasal Congestion  . Facial Pain    HPI Carl Trujillo is a 70 y.o. male.  He reports 3 weeks of nasal congestion.  Denies fever.  Reports nasal drainage has been thick and yellow at times.  Has been using over-the-counter Alka-Seltzer cold medicine and nasal spray for symptoms with minimal relief.  BP high today at Urgent Care. Pt reports he took his medicine this morning and BP is usually better controlled.   HPI  Past Medical History:  Diagnosis Date  . Anxiety   . Arthritis of right knee   . Arthritis of spine   . ED (erectile dysfunction)   . Hypertension     Patient Active Problem List   Diagnosis Date Noted  . Abscess of multiple sites 12/09/2020  . Insomnia 09/27/2020  . Hyperglycemia 03/28/2020  . Encounter for well adult exam with abnormal findings 06/24/2018  . History of tobacco use 09/18/2017  . Mixed hyperlipidemia 08/22/2010  . LOW BACK PAIN, CHRONIC 11/21/2009  . Knee pain 07/11/2009  . Generalized anxiety disorder 01/31/2009  . Back pain 07/13/2008  . Obstructive sleep apnea 02/04/2008  . ERECTILE DYSFUNCTION, MILD 06/29/2007  . ARTHRITIS, SPINE 06/29/2007  . Essential hypertension 06/28/2007    History reviewed. No pertinent surgical history.     Home Medications    Prior to Admission medications   Medication Sig Start Date End Date Taking? Authorizing Provider  amoxicillin-clavulanate (AUGMENTIN) 875-125 MG tablet Take 1 tablet by mouth every 12 (twelve) hours. 07/29/21  Yes Cathlyn Parsons, NP  alprazolam Prudy Feeler) 2 MG tablet Take 1/2-1 tab po TID prn anxiety and insomnia 02/15/21   Corie Chiquito, PMHNP  Ascorbic Acid (VITAMIN C) 1000 MG tablet Take 5,000 mg by mouth daily.     [provider]  aspirin 81 MG tablet Take 81 mg by mouth daily.    [provider]  Cascara  Sagrada 450 MG CAPS Take by mouth.    [provider]  cholecalciferol (VITAMIN D) 1000 units tablet Take 1,000 Units by mouth daily.    [provider]  Cod Liver Oil 1000 MG CAPS Take by mouth.    [provider]  cyclobenzaprine (FLEXERIL) 10 MG tablet Take 1 tablet (10 mg total) by mouth 2 (two) times daily as needed. 04/10/21   Corwin Levins, MD  diclofenac (VOLTAREN) 75 MG EC tablet 1 tab by moth twice per day as needed 04/10/21   Corwin Levins, MD  losartan-hydrochlorothiazide [facility]) 100-25 MG tablet Take 1 tablet by mouth once daily 12/24/20   Corwin Levins, MD  Magnesium-Zinc 133.33-5 MG TABS Take by mouth.    [provider]  mupirocin nasal ointment (BACTROBAN) 2 % Place 1 application into the nose 2 (two) times daily. Use one-half of tube in each nostril twice daily for five (5) days. After application, press sides of nose together and gently massage. 12/06/20   Corwin Levins, MD  Omega-3 Fatty Acids (OMEGA 3 PO) Take by mouth. Omega XL - 2 capsules daily    [provider]  pyridoxine (B-6) 100 MG tablet Take 100 mg by mouth daily.    [provider]  sildenafil (VIAGRA) 100 MG tablet Take 1 tablet (100 mg total) by mouth daily as needed. Use as instructed 10/30/11  Roderick Pee, MD  Specialty Vitamins Products (CENTRUM PERFORMANCE) TABS Take by mouth.    [provider]  traZODone (DESYREL) 100 MG tablet Take 1 tablet (100 mg total) by mouth at bedtime. 09/27/20   Corwin Levins, MD  vitamin B-12 (CYANOCOBALAMIN) 1000 MCG tablet Take 1,000 mcg by mouth daily.    [provider]    Family History Family History  Problem Relation Age of Onset  . Cancer Other   . Coronary artery disease Other   . CVA Sister   . Cancer Mother   . Post-traumatic stress disorder Brother     Social History Social History   Tobacco Use  . Smoking status: Former    Types: Cigars    Quit date: 10/13/1997    Years since  quitting: 23.8  . Smokeless tobacco: Never  . Tobacco comments:    current smoker of cigars in the pm - stopped cigarettes 97yrs ago  Vaping Use  . Vaping Use: Never used  Substance Use Topics  . Alcohol use: Yes    Alcohol/week: 3.0 standard drinks    Types: 3 Glasses of wine per week  . Drug use: No     Allergies   Patient has no known allergies.   Review of Systems Review of Systems   Physical Exam Triage Vital Signs ED Triage Vitals [07/29/21 1455]  Enc Vitals Group     BP (!) 185/100     Pulse Rate 69     Resp 18     Temp 98.4 F (36.9 C)     Temp Source Oral     SpO2 94 %     Weight      Height      Head Circumference      Peak Flow      Pain Score      Pain Loc      Pain Edu?      Excl. in GC?    No data found.  Updated Vital Signs BP (!) 185/100 (BP Location: Right Arm)   Pulse 69   Temp 98.4 F (36.9 C) (Oral)   Resp 18   SpO2 94%   Visual Acuity Right Eye Distance:   Left Eye Distance:   Bilateral Distance:    Right Eye Near:   Left Eye Near:    Bilateral Near:     Physical Exam Constitutional:      Appearance: Normal appearance. He is not ill-appearing.  HENT:     Right Ear: Tympanic membrane, ear canal and external ear normal.     Left Ear: Tympanic membrane, ear canal and external ear normal.     Nose: Congestion and rhinorrhea present.     Right Sinus: No maxillary sinus tenderness or frontal sinus tenderness.     Left Sinus: No maxillary sinus tenderness or frontal sinus tenderness.     Mouth/Throat:     Mouth: Mucous membranes are moist.     Pharynx: Oropharynx is clear.  Cardiovascular:     Rate and Rhythm: Normal rate and regular rhythm.  Pulmonary:     Effort: Pulmonary effort is normal.     Breath sounds: Normal breath sounds.  Neurological:     Mental Status: He is alert.     UC Treatments / Results  Labs (all labs ordered are listed, but only abnormal results are displayed) Labs Reviewed - No data to  display  EKG   Radiology No results found.  Procedures Procedures (including critical  care time)  Medications Ordered in UC Medications - No data to display  Initial Impression / Assessment and Plan / UC Course  I have reviewed the triage vital signs and the nursing notes.  Pertinent labs & imaging results that were available during my care of the patient were reviewed by me and considered in my medical decision making (see chart for details).  Pt sx consistent with acute bacterial sinusitis although he does not have maxillary or frontal sinus tenderness to palpation.  Given duration of symptoms, I will treat for sinusitis.  Patient reports his blood pressure is typically never this high.  Will check it on his own and report to his PCP if his BP stays elevated as it is today   Final Clinical Impressions(s) / UC Diagnoses   Final diagnoses:  Acute bacterial sinusitis  Essential hypertension     Discharge Instructions      Check your blood pressure. If it is staying high like today at Urgent Care (185/100), please let Dr. Jonny Ruiz know.      ED Prescriptions     Medication Sig Dispense Auth. Provider   amoxicillin-clavulanate (AUGMENTIN) 875-125 MG tablet Take 1 tablet by mouth every 12 (twelve) hours. 14 tablet Cathlyn Parsons, NP      PDMP not reviewed this encounter.   Cathlyn Parsons, NP 07/29/21 1529

## 2021-07-29 NOTE — ED Triage Notes (Signed)
Pt presents with nasal congestion and sinus pain Xs 3 weeks. States has been uses nasal spray and alka seltzer cold.

## 2021-07-29 NOTE — Discharge Instructions (Signed)
Check your blood pressure. If it is staying high like today at Urgent Care (185/100), please let Dr. Jonny Ruiz know.

## 2021-08-07 ENCOUNTER — Other Ambulatory Visit: Payer: Self-pay | Admitting: Internal Medicine

## 2021-08-13 ENCOUNTER — Other Ambulatory Visit: Payer: Self-pay | Admitting: Psychiatry

## 2021-08-13 DIAGNOSIS — F411 Generalized anxiety disorder: Secondary | ICD-10-CM

## 2021-08-16 ENCOUNTER — Other Ambulatory Visit: Payer: Self-pay

## 2021-08-16 ENCOUNTER — Ambulatory Visit: Payer: Medicare HMO | Admitting: Psychiatry

## 2021-08-16 ENCOUNTER — Encounter: Payer: Self-pay | Admitting: Psychiatry

## 2021-08-16 ENCOUNTER — Ambulatory Visit (INDEPENDENT_AMBULATORY_CARE_PROVIDER_SITE_OTHER): Payer: Medicare HMO | Admitting: Psychiatry

## 2021-08-16 DIAGNOSIS — F411 Generalized anxiety disorder: Secondary | ICD-10-CM

## 2021-08-16 MED ORDER — ALPRAZOLAM 2 MG PO TABS: ORAL_TABLET | ORAL | 5 refills | Status: DC

## 2021-08-16 NOTE — Progress Notes (Signed)
Fumio Vandam [id number] [birth year] 70 y.o.  Subjective:   Patient ID:  Carl Trujillo is a 70 y.o. (DOB [birth year]) male.  Chief Complaint:  Chief Complaint  Patient presents with   Follow-up    Anxiety    HPI Drayven Marchena presents to the office today for follow-up of anxiety. He reports that he has been doing well overall. Denies any recent anxiety. Has been sleeping well with Trazodone. Denies depressed mood. He reports avoiding people that negatively affect his mood. Energy and motivation have been ok. He reports that his appetite has been good. Denies SI.   He reports that he had illness in late September for the first time in about 10 years. Reports he had a severe sinus infection that has since resolved.   Started drinking coffee again.   Mini-Mental    Flowsheet Row Clinical Support from 03/17/2018 in [facility]  Total Score (max 30 points ) 29      PHQ2-9    Flowsheet Row Office Visit from 12/06/2020 in [facility] Clinical Support from 03/28/2020 in [facility] Visit from 12/21/2019 in [facility] Clinical Support from 03/17/2018 in [facility] Office Visit from 07/03/2017 in [facility]  PHQ-2 Total Score 0 0 1 0 0  PHQ-9 Total Score -- -- -- 0 --      Flowsheet Row ED from 07/29/2021 in [facility] at [facility] ED from 07/11/2021 in [facility] EMERGENCY DEPARTMENT  C-SSRS RISK CATEGORY Error: Question 6 not populated No Risk        Review of Systems:  Review of Systems  HENT:  Negative for sinus pressure and sinus pain.   Musculoskeletal:  Positive for back pain. Negative for gait problem.  Psychiatric/Behavioral:         Please refer to HPI   Medications: I have reviewed the patient's current medications.  Current Outpatient Medications  Medication Sig Dispense Refill   traZODone  (DESYREL) 100 MG tablet TAKE 1 TABLET BY MOUTH AT BEDTIME 90 tablet 0   [START ON 09/12/2021] alprazolam (XANAX) 2 MG tablet TAKE 1/2 TO 1 (ONE-HALF TO ONE) TABLET BY MOUTH THREE TIMES DAILY AS NEEDED FOR ANXIETY AND FOR INSOMNIA 80 tablet 5   amoxicillin-clavulanate (AUGMENTIN) 875-125 MG tablet Take 1 tablet by mouth every 12 (twelve) hours. (Patient not taking: Reported on 08/16/2021) 14 tablet 0   Ascorbic Acid (VITAMIN C) 1000 MG tablet Take 5,000 mg by mouth daily.      aspirin 81 MG tablet Take 81 mg by mouth daily.     Cascara Sagrada 450 MG CAPS Take by mouth.     cholecalciferol (VITAMIN D) 1000 units tablet Take 1,000 Units by mouth daily.     Cod Liver Oil 1000 MG CAPS Take by mouth.     cyclobenzaprine (FLEXERIL) 10 MG tablet Take 1 tablet (10 mg total) by mouth 2 (two) times daily as needed. 60 tablet 11   diclofenac (VOLTAREN) 75 MG EC tablet 1 tab by moth twice per day as needed 60 tablet 11   losartan-hydrochlorothiazide (HYZAAR) 100-25 MG tablet Take 1 tablet by mouth once daily 90 tablet 3   Magnesium-Zinc 133.33-5 MG TABS Take by mouth.     mupirocin nasal ointment (BACTROBAN) 2 % Place 1 application into the nose 2 (two) times daily. Use one-half of tube in each nostril twice daily for five (5)  days. After application, press sides of nose together and gently massage. 10 g 0   Omega-3 Fatty Acids (OMEGA 3 PO) Take by mouth. Omega XL - 2 capsules daily (Patient not taking: Reported on 08/16/2021)     pyridoxine (B-6) 100 MG tablet Take 100 mg by mouth daily.     sildenafil (VIAGRA) 100 MG tablet Take 1 tablet (100 mg total) by mouth daily as needed. Use as instructed 5 tablet 11   Specialty Vitamins Products (CENTRUM PERFORMANCE) TABS Take by mouth.     vitamin B-12 (CYANOCOBALAMIN) 1000 MCG tablet Take 1,000 mcg by mouth daily.     No current facility-administered medications for this visit.    Medication Side Effects: None  Allergies: No Known Allergies  Past Medical  History:  Diagnosis Date   Anxiety    Arthritis of right knee    Arthritis of spine    ED (erectile dysfunction)    Hypertension     Past Medical History, Surgical history, Social history, and Family history were reviewed and updated as appropriate.   Please see review of systems for further details on the patient's review from today.   Objective:   Physical Exam:  There were no vitals taken for this visit.  Physical Exam Constitutional:      General: He is not in acute distress. Musculoskeletal:        General: No deformity.  Neurological:     Mental Status: He is alert and oriented to person, place, and time.     Coordination: Coordination normal.  Psychiatric:        Attention and Perception: Attention and perception normal. He does not perceive auditory or visual hallucinations.        Mood and Affect: Mood normal. Mood is not anxious or depressed. Affect is not labile, blunt, angry or inappropriate.        Speech: Speech normal.        Behavior: Behavior normal.        Thought Content: Thought content normal. Thought content is not paranoid or delusional. Thought content does not include homicidal or suicidal ideation. Thought content does not include homicidal or suicidal plan.        Cognition and Memory: Cognition and memory normal.        Judgment: Judgment normal.     Comments: Insight intact    Lab Review:     Component Value Date/Time   NA 140 12/06/2020 1250   K 3.9 12/06/2020 1250   CL 99 12/06/2020 1250   CO2 33 (H) 12/06/2020 1250   GLUCOSE 84 12/06/2020 1250   BUN 13 12/06/2020 1250   CREATININE 1.02 12/06/2020 1250   CALCIUM 9.4 12/06/2020 1250   PROT 7.1 12/06/2020 1250   ALBUMIN 4.4 12/06/2020 1250   AST 27 12/06/2020 1250   ALT 31 12/06/2020 1250   ALKPHOS 75 12/06/2020 1250   BILITOT 1.1 12/06/2020 1250   GFRNONAA 88.07 11/14/2009 1036       Component Value Date/Time   WBC 4.4 12/06/2020 1250   RBC 4.61 12/06/2020 1250   HGB 14.8  12/06/2020 1250   HCT 43.0 12/06/2020 1250   PLT 266.0 12/06/2020 1250   MCV 93.4 12/06/2020 1250   MCHC 34.4 12/06/2020 1250   RDW 13.1 12/06/2020 1250   LYMPHSABS 1.0 12/06/2020 1250   MONOABS 0.5 12/06/2020 1250   EOSABS 0.1 12/06/2020 1250   BASOSABS 0.0 12/06/2020 1250    No results found for: POCLITH, LITHIUM  No results found for: PHENYTOIN, PHENOBARB, VALPROATE, CBMZ   .res Assessment: Plan:   Will continue current plan of care since target signs and symptoms are well controlled without any tolerability issues. Continue Xanax 2 mg 1/2-1 tab po TID prn anxiety and insomnia.  Pt to follow-up in 6 months or sooner if clinically indicated.  Patient advised to contact office with any questions, adverse effects, or acute worsening in signs and symptoms.    Tracen was seen today for follow-up.  Diagnoses and all orders for this visit:  Anxiety state -     alprazolam (XANAX) 2 MG tablet; TAKE 1/2 TO 1 (ONE-HALF TO ONE) TABLET BY MOUTH THREE TIMES DAILY AS NEEDED FOR ANXIETY AND FOR INSOMNIA    Please see After Visit Summary for patient specific instructions.  Future Appointments  Date Time Provider Department Center  10/01/2021 10:00 AM [facility] HEALTH ADVISOR [facility] None  10/01/2021 11:00 AM Corwin Levins, MD [facility] None  02/13/2022 11:00 AM Corie Chiquito, PMHNP [facility] None    No orders of the defined types were placed in this encounter.   -------------------------------

## 2021-09-30 IMAGING — MR MR [PERSON_NAME]*[PERSON_NAME]* WO/W CM
5 of 8 series · 23 of 40 positions shown · IV contrast (20ml Multihance)
Comparison: X-ray 12/06/2020

CLINICAL DATA: Slowly healing wound near the second MCP joint

EXAM:
MRI OF THE LEFT HAND WITHOUT AND WITH CONTRAST
TECHNIQUE: Multiplanar, multisequence MR imaging of the left hand was performed
before and after the administration of intravenous contrast.
CONTRAST:  15mL MULTIHANCE GADOBENATE DIMEGLUMINE 529 MG/ML IV SOLN

[Series 3: T2 fat-sat · axial · 4.0mm · 0.31mm/px · z∈[-108,+111]mm · 8 of 50 slices shown]
[im 1/50]
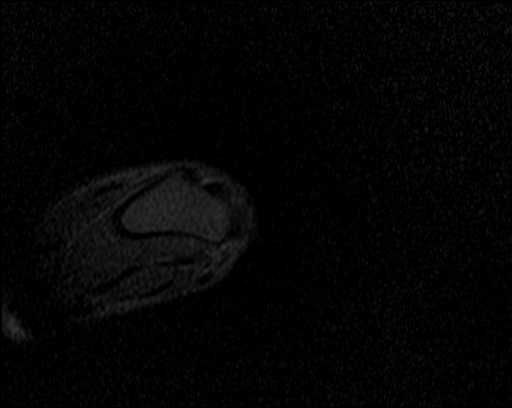
[im 8/50]
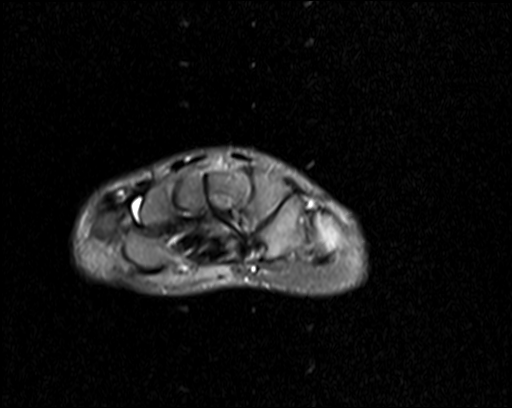
[im 15/50]
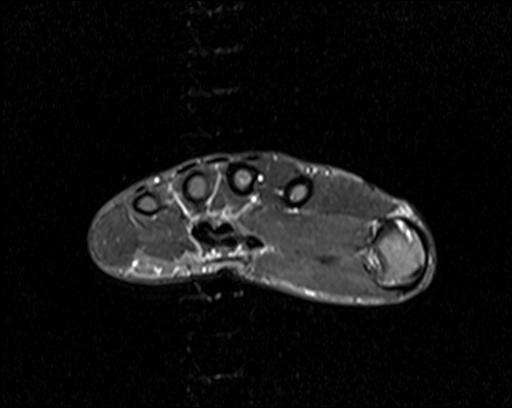
[im 22/50]
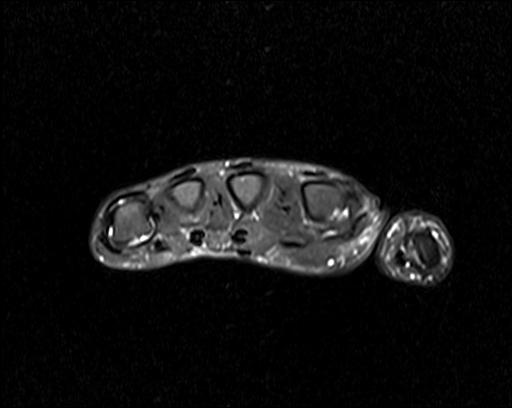
[im 29/50]
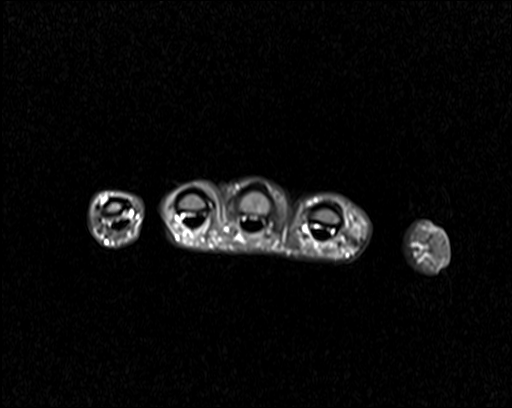
[im 36/50]
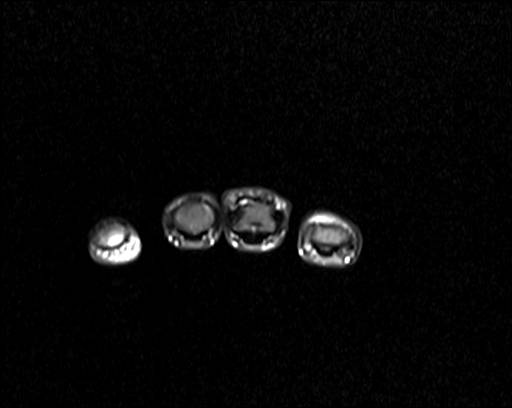
[im 43/50]
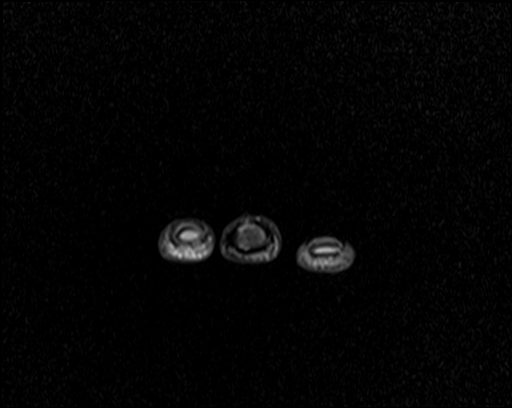
[im 50/50]
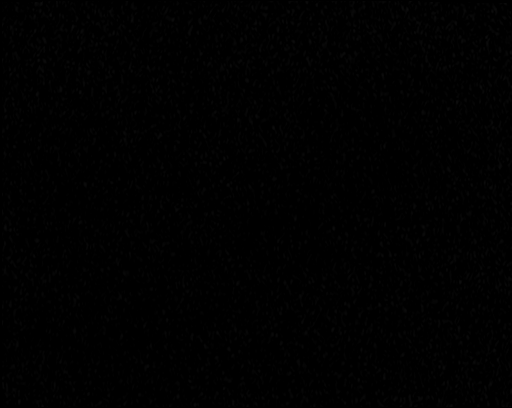

[Series 4: PD fat-sat · sagittal · 3.5mm · 0.43mm/px · 5 of 37 slices shown]
[im 1/37]
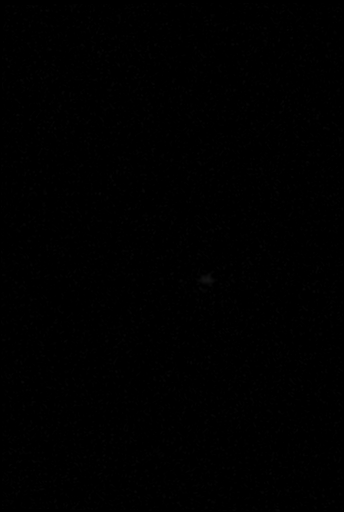
[im 10/37]
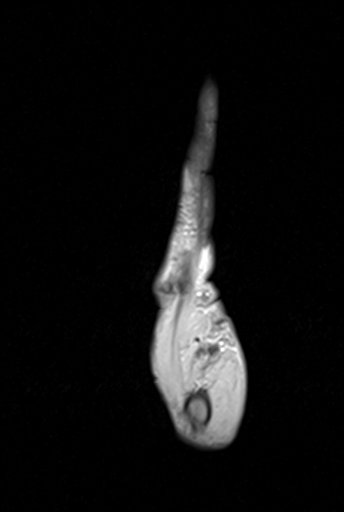
[im 19/37]
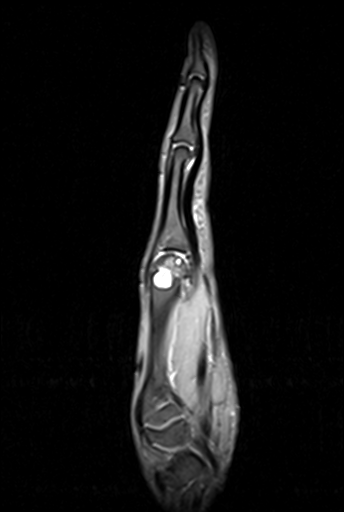
[im 28/37]
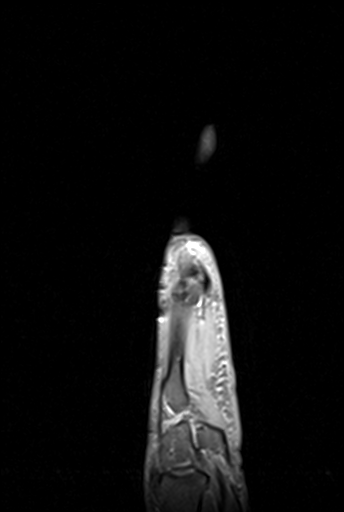
[im 37/37]
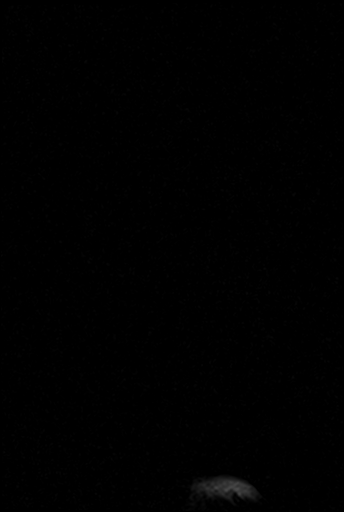

[Series 5: T1 · coronal · 3.0mm · 0.43mm/px · 2 of 16 slices shown (1 of 2)]
[im 1/16]
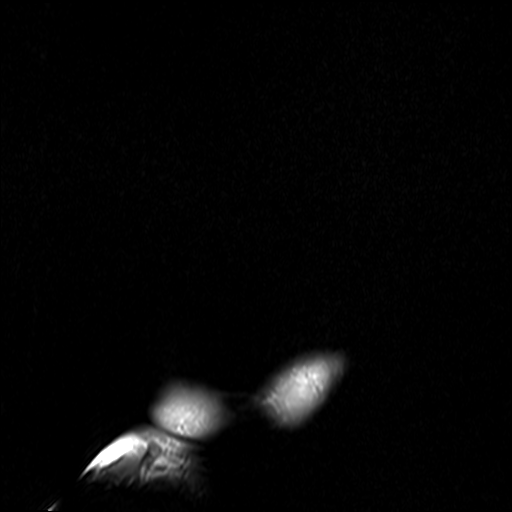
[im 16/16]
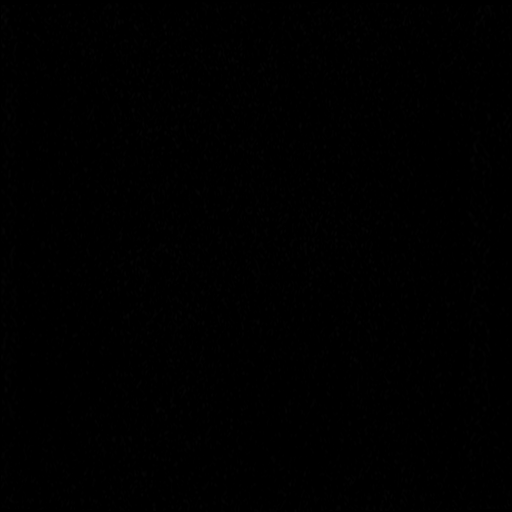

[Series 6: STIR · coronal · 3.0mm · 0.43mm/px · 1 of 16 slices shown]
[im 1/16]
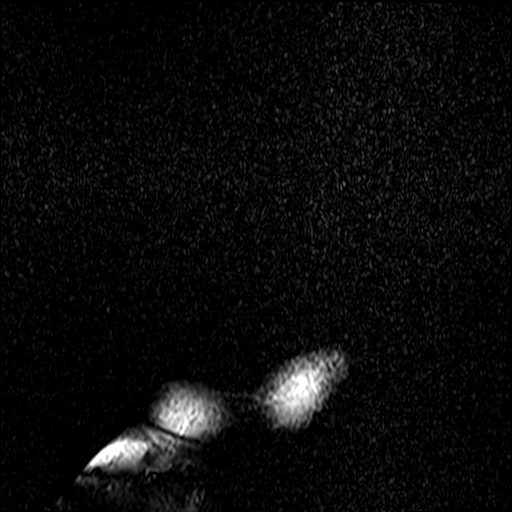

[Series 7: T1 · axial · 4.0mm · 0.31mm/px · z∈[-108,+111]mm · 7 of 50 slices shown (2 of 2)]
[im 1/50]
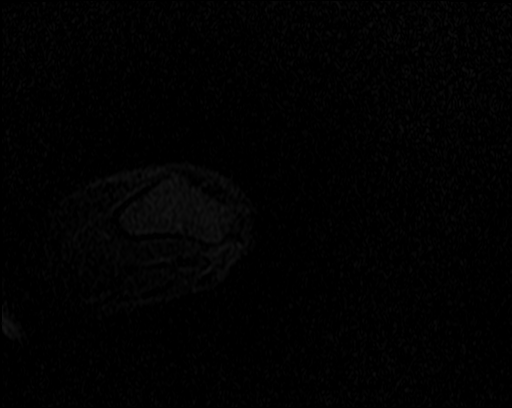
[im 9/50]
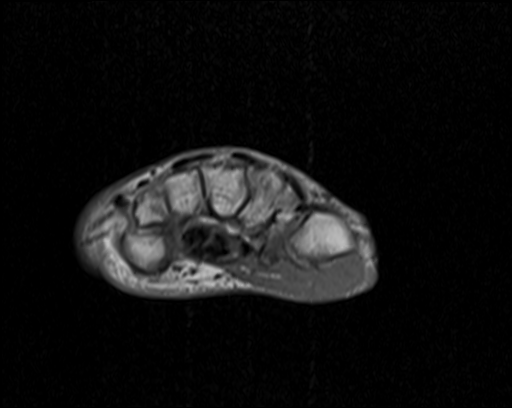
[im 17/50]
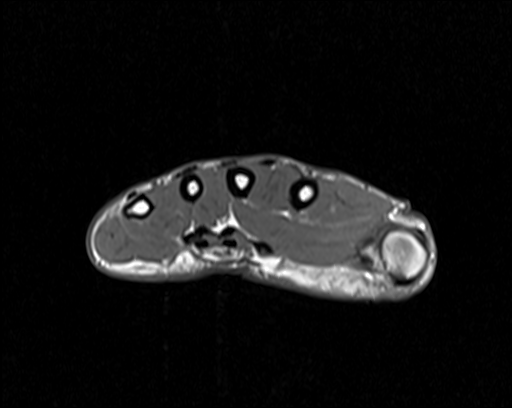
[im 25/50]
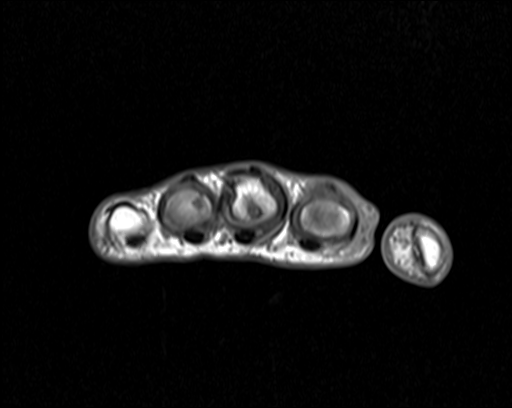
[im 33/50]
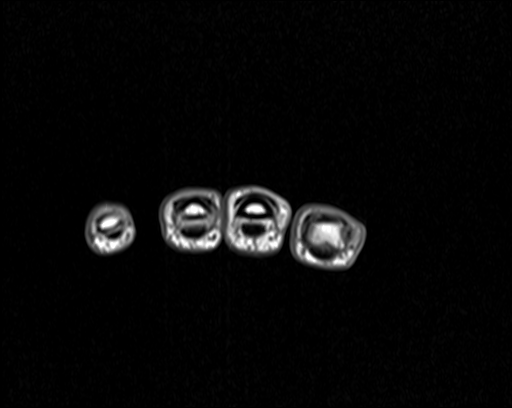
[im 41/50]
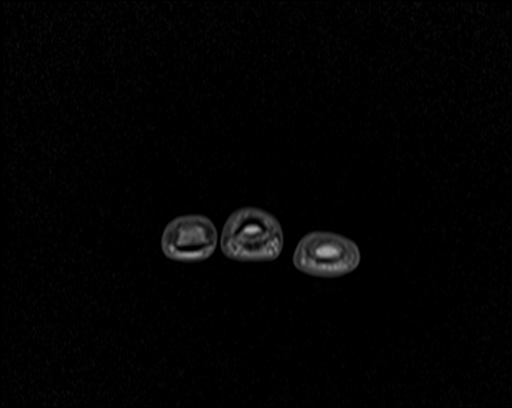
[im 50/50]
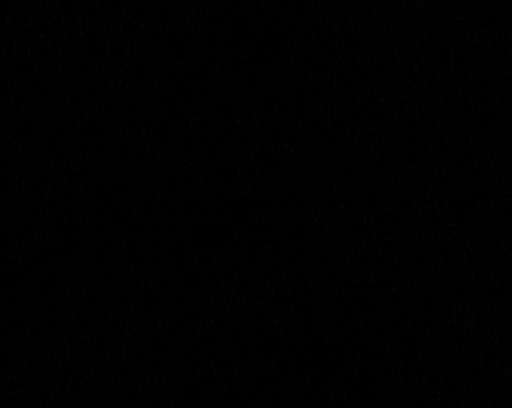

[23 of 40 positions shown; findings below may reference images not displayed]

FINDINGS: Bones/Joint/Cartilage

No acute fracture. No dislocation. Mild osteoarthritis of the left
hand most pronounced at the first CMC and first MCP joints. No bony
erosion. No periosteal edema. Small intraosseous ganglion cysts
within the second, third, and fourth metacarpal heads. Largest is
located within the third metacarpal head measuring up to 1.4 cm. No
bone marrow edema. No suspicious marrow replacing bone lesion.

Ligaments

Collateral ligaments of the hand appear intact.

Muscles and Tendons

Intact flexor and extensor tendons without tendinosis, tear, or
tenosynovitis. Normal muscle bulk and signal intensity without
edema, atrophy, or fatty infiltration.

Soft tissues

Superficial soft tissue swelling along the radial aspect of the
second MCP joint with soft tissue edema. No organized or rim
enhancing fluid collection. Remaining soft tissues are within normal
limits.
IMPRESSION: 1. Superficial soft tissue swelling and edema along the radial
aspect of the second MCP joint. No organized or rim enhancing fluid
collection.
2. No evidence of osteomyelitis.
3. Mild osteoarthritis of the left hand most pronounced at the first
CMC and first MCP joints.

## 2021-10-01 ENCOUNTER — Encounter: Payer: Self-pay | Admitting: Internal Medicine

## 2021-10-01 ENCOUNTER — Ambulatory Visit (INDEPENDENT_AMBULATORY_CARE_PROVIDER_SITE_OTHER): Payer: Medicare HMO | Admitting: Internal Medicine

## 2021-10-01 ENCOUNTER — Ambulatory Visit: Payer: Medicare HMO

## 2021-10-01 ENCOUNTER — Other Ambulatory Visit: Payer: Self-pay

## 2021-10-01 VITALS — BP 138/70 | HR 77 | Temp 98.0°F | Ht 71.0 in | Wt 160.0 lb

## 2021-10-01 DIAGNOSIS — E782 Mixed hyperlipidemia: Secondary | ICD-10-CM | POA: Diagnosis not present

## 2021-10-01 DIAGNOSIS — E538 Deficiency of other specified B group vitamins: Secondary | ICD-10-CM

## 2021-10-01 DIAGNOSIS — I10 Essential (primary) hypertension: Secondary | ICD-10-CM

## 2021-10-01 DIAGNOSIS — E559 Vitamin D deficiency, unspecified: Secondary | ICD-10-CM

## 2021-10-01 DIAGNOSIS — F411 Generalized anxiety disorder: Secondary | ICD-10-CM | POA: Diagnosis not present

## 2021-10-01 DIAGNOSIS — R739 Hyperglycemia, unspecified: Secondary | ICD-10-CM

## 2021-10-01 DIAGNOSIS — Z0001 Encounter for general adult medical examination with abnormal findings: Secondary | ICD-10-CM

## 2021-10-01 NOTE — Patient Instructions (Signed)
Please continue all other medications as before, and refills have been done if requested.  Please have the pharmacy call with any other refills you may need.  Please continue your efforts at being more active, low cholesterol diet, and weight control  Please keep your appointments with your specialists as you may have planned  Please make an Appointment to return in 3 months, or sooner if needed, also with Lab Appointment for testing done 3-5 days before at the FIRST FLOOR Lab (so this is for TWO appointments - please see the scheduling desk as you leave)  Due to the ongoing Covid 19 pandemic, our lab now requires an appointment for any labs done at our office.  If you need labs done and do not have an appointment, please call our office ahead of time to schedule before presenting to the lab for your testing.   

## 2021-10-01 NOTE — Progress Notes (Signed)
Patient ID: Carl Trujillo, male   DOB: [birth year], 70 y.o.   MRN: [id number]        Chief Complaint: follow up HTN, HLD and hyperglycemia, anxiety       HPI:  Carl Trujillo is a 70 y.o. male here overall doing ok, Pt denies chest pain, increased sob or doe, wheezing, orthopnea, PND, increased LE swelling, palpitations, dizziness or syncope.   Pt denies polydipsia, polyuria, or new focal neuro s/s.   Pt denies fever, wt loss, night sweats, loss of appetite, or other constitutional symptoms  Denies worsening depressive symptoms, suicidal ideation, or panic; has ongoing anxiety. Lost several lbs recently but does not think significant.  No other new complaints.   Wt Readings from Last 3 Encounters:  10/01/21 160 lb (72.6 kg)  04/10/21 164 lb (74.4 kg)  01/01/21 163 lb (73.9 kg)   BP Readings from Last 3 Encounters:  10/01/21 138/70  07/29/21 (!) 185/100  07/11/21 (!) 149/93         Past Medical History:  Diagnosis Date   Anxiety    Arthritis of right knee    Arthritis of spine    ED (erectile dysfunction)    Hypertension    History reviewed. No pertinent surgical history.  reports that he quit smoking about 23 years ago. His smoking use included cigars. He has never used smokeless tobacco. He reports current alcohol use of about 3.0 standard drinks per week. He reports that he does not use drugs. family history includes CVA in his sister; Cancer in his mother and another family member; Coronary artery disease in an other family member; Post-traumatic stress disorder in his brother. No Known Allergies Current Outpatient Medications on File Prior to Visit  Medication Sig Dispense Refill   alprazolam (XANAX) 2 MG tablet TAKE 1/2 TO 1 (ONE-HALF TO ONE) TABLET BY MOUTH THREE TIMES DAILY AS NEEDED FOR ANXIETY AND FOR INSOMNIA 80 tablet 5   Ascorbic Acid (VITAMIN C) 1000 MG tablet Take 5,000 mg by mouth daily.      aspirin 81 MG tablet Take 81 mg by mouth daily.     Cascara Sagrada 450 MG CAPS  Take by mouth.     cholecalciferol (VITAMIN D) 1000 units tablet Take 1,000 Units by mouth daily.     Cod Liver Oil 1000 MG CAPS Take by mouth.     cyclobenzaprine (FLEXERIL) 10 MG tablet Take 1 tablet (10 mg total) by mouth 2 (two) times daily as needed. 60 tablet 11   diclofenac (VOLTAREN) 75 MG EC tablet 1 tab by moth twice per day as needed 60 tablet 11   losartan-hydrochlorothiazide (HYZAAR) 100-25 MG tablet Take 1 tablet by mouth once daily 90 tablet 3   Magnesium-Zinc 133.33-5 MG TABS Take by mouth.     mupirocin nasal ointment (BACTROBAN) 2 % Place 1 application into the nose 2 (two) times daily. Use one-half of tube in each nostril twice daily for five (5) days. After application, press sides of nose together and gently massage. 10 g 0   pyridoxine (B-6) 100 MG tablet Take 100 mg by mouth daily.     sildenafil (VIAGRA) 100 MG tablet Take 1 tablet (100 mg total) by mouth daily as needed. Use as instructed 5 tablet 11   Specialty Vitamins Products (CENTRUM PERFORMANCE) TABS Take by mouth.     traZODone (DESYREL) 100 MG tablet TAKE 1 TABLET BY MOUTH AT BEDTIME 90 tablet 0   vitamin B-12 (CYANOCOBALAMIN) 1000 MCG tablet Take 1,000  mcg by mouth daily.     Omega-3 Fatty Acids (OMEGA 3 PO) Take by mouth. Omega XL - 2 capsules daily (Patient not taking: Reported on 08/16/2021)     No current facility-administered medications on file prior to visit.        ROS:  All others reviewed and negative.  Objective        PE:  BP 138/70 (BP Location: Right Arm, Patient Position: Sitting, Cuff Size: Normal)    Pulse 77    Temp 98 F (36.7 C) (Oral)    Ht 5\' 11"  (1.803 m)    Wt 160 lb (72.6 kg)    SpO2 96%    BMI 22.32 kg/m                 Constitutional: Pt appears in NAD               HENT: Head: NCAT.                Right Ear: External ear normal.                 Left Ear: External ear normal.                Eyes: . Pupils are equal, round, and reactive to light. Conjunctivae and EOM are normal                Nose: without d/c or deformity               Neck: Neck supple. Gross normal ROM               Cardiovascular: Normal rate and regular rhythm.                 Pulmonary/Chest: Effort normal and breath sounds without rales or wheezing.                Abd:  Soft, NT, ND, + BS, no organomegaly               Neurological: Pt is alert. At baseline orientation, motor grossly intact               Skin: Skin is warm. No rashes, no other new lesions, LE edema - none               Psychiatric: Pt behavior is normal without agitation, mild to mod anxiety  Micro: none  Cardiac tracings I have personally interpreted today:  none  Pertinent Radiological findings (summarize): none   Lab Results  Component Value Date   WBC 4.4 12/06/2020   HGB 14.8 12/06/2020   HCT 43.0 12/06/2020   PLT 266.0 12/06/2020   GLUCOSE 84 12/06/2020   CHOL 161 12/06/2020   TRIG 128.0 12/06/2020   HDL 57.00 12/06/2020   LDLCALC 78 12/06/2020   ALT 31 12/06/2020   AST 27 12/06/2020   NA 140 12/06/2020   K 3.9 12/06/2020   CL 99 12/06/2020   CREATININE 1.02 12/06/2020   BUN 13 12/06/2020   CO2 33 (H) 12/06/2020   TSH 2.17 12/06/2020   PSA 1.62 12/06/2020   HGBA1C 5.8 12/06/2020   Assessment/Plan:  Carl Trujillo is a 70 y.o. Black or African American [2] male with  has a past medical history of Anxiety, Arthritis of right knee, Arthritis of spine, ED (erectile dysfunction), and Hypertension.  Mixed hyperlipidemia Lab Results  Component Value Date   LDLCALC 78 12/06/2020   Stable, pt to  continue current low chol diet   Generalized anxiety disorder Overall stable, declines need for change in tx or need for counseling referral  Essential hypertension BP Readings from Last 3 Encounters:  10/01/21 138/70  07/29/21 (!) 185/100  07/11/21 (!) 149/93   Stable, pt to continue medical treatment hyzaar   Hyperglycemia Lab Results  Component Value Date   HGBA1C 5.8 12/06/2020   Stable, pt to  continue current medical treatment  - diet  Followup: Return if symptoms worsen or fail to improve.  Oliver Barre, MD 10/02/2021 4:55 AM  [facility] Internal Medicine

## 2021-10-02 ENCOUNTER — Encounter: Payer: Self-pay | Admitting: Internal Medicine

## 2021-10-02 NOTE — Assessment & Plan Note (Signed)
Lab Results  Component Value Date   LDLCALC 78 12/06/2020   Stable, pt to continue current low chol diet

## 2021-10-02 NOTE — Assessment & Plan Note (Signed)
Overall stable, declines need for change in tx or need for counseling referral

## 2021-10-02 NOTE — Assessment & Plan Note (Signed)
Lab Results  Component Value Date   HGBA1C 5.8 12/06/2020   Stable, pt to continue current medical treatment  - diet

## 2021-10-02 NOTE — Assessment & Plan Note (Signed)
BP Readings from Last 3 Encounters:  10/01/21 138/70  07/29/21 (!) 185/100  07/11/21 (!) 149/93   Stable, pt to continue medical treatment hyzaar

## 2021-10-09 ENCOUNTER — Telehealth: Payer: Self-pay | Admitting: Internal Medicine

## 2021-10-09 NOTE — Telephone Encounter (Signed)
Type of form received - Handicapped Placard    Form placed in- Provider mailbox   Additional instructions from the patient - Notify by phone when completed.    reminded patient that forms take 7-10 business days

## 2021-10-10 NOTE — Telephone Encounter (Signed)
Notified patient that his form is ready to be picked up.

## 2021-10-31 DIAGNOSIS — N5201 Erectile dysfunction due to arterial insufficiency: Secondary | ICD-10-CM | POA: Diagnosis not present

## 2021-10-31 DIAGNOSIS — N401 Enlarged prostate with lower urinary tract symptoms: Secondary | ICD-10-CM | POA: Diagnosis not present

## 2021-10-31 DIAGNOSIS — R350 Frequency of micturition: Secondary | ICD-10-CM | POA: Diagnosis not present

## 2021-11-15 ENCOUNTER — Other Ambulatory Visit: Payer: Self-pay | Admitting: Internal Medicine

## 2021-11-15 DIAGNOSIS — I10 Essential (primary) hypertension: Secondary | ICD-10-CM

## 2021-12-23 ENCOUNTER — Other Ambulatory Visit (INDEPENDENT_AMBULATORY_CARE_PROVIDER_SITE_OTHER): Payer: Medicare PPO

## 2021-12-23 DIAGNOSIS — R739 Hyperglycemia, unspecified: Secondary | ICD-10-CM

## 2021-12-23 DIAGNOSIS — E782 Mixed hyperlipidemia: Secondary | ICD-10-CM | POA: Diagnosis not present

## 2021-12-23 DIAGNOSIS — E559 Vitamin D deficiency, unspecified: Secondary | ICD-10-CM | POA: Diagnosis not present

## 2021-12-23 DIAGNOSIS — Z0001 Encounter for general adult medical examination with abnormal findings: Secondary | ICD-10-CM | POA: Diagnosis not present

## 2021-12-23 DIAGNOSIS — E538 Deficiency of other specified B group vitamins: Secondary | ICD-10-CM | POA: Diagnosis not present

## 2021-12-23 LAB — URINALYSIS, ROUTINE W REFLEX MICROSCOPIC
Bilirubin Urine: NEGATIVE
Hgb urine dipstick: NEGATIVE
Ketones, ur: NEGATIVE
Leukocytes,Ua: NEGATIVE
Nitrite: NEGATIVE
RBC / HPF: NONE SEEN (ref 0–?)
Specific Gravity, Urine: 1.01 (ref 1.000–1.030)
Total Protein, Urine: NEGATIVE
Urine Glucose: NEGATIVE
Urobilinogen, UA: 0.2 (ref 0.0–1.0)
pH: 6.5 (ref 5.0–8.0)

## 2021-12-23 LAB — HEPATIC FUNCTION PANEL
ALT: 20 U/L (ref 0–53)
AST: 20 U/L (ref 0–37)
Albumin: 4.2 g/dL (ref 3.5–5.2)
Alkaline Phosphatase: 73 U/L (ref 39–117)
Bilirubin, Direct: 0.2 mg/dL (ref 0.0–0.3)
Total Bilirubin: 0.9 mg/dL (ref 0.2–1.2)
Total Protein: 6.3 g/dL (ref 6.0–8.3)

## 2021-12-23 LAB — CBC WITH DIFFERENTIAL/PLATELET
Basophils Absolute: 0 10*3/uL (ref 0.0–0.1)
Basophils Relative: 0.4 % (ref 0.0–3.0)
Eosinophils Absolute: 0.3 10*3/uL (ref 0.0–0.7)
Eosinophils Relative: 6.8 % — ABNORMAL HIGH (ref 0.0–5.0)
HCT: 39.6 % (ref 39.0–52.0)
Hemoglobin: 13.7 g/dL (ref 13.0–17.0)
Lymphocytes Relative: 31.6 % (ref 12.0–46.0)
Lymphs Abs: 1.2 10*3/uL (ref 0.7–4.0)
MCHC: 34.6 g/dL (ref 30.0–36.0)
MCV: 92 fl (ref 78.0–100.0)
Monocytes Absolute: 0.4 10*3/uL (ref 0.1–1.0)
Monocytes Relative: 11.9 % (ref 3.0–12.0)
Neutro Abs: 1.9 10*3/uL (ref 1.4–7.7)
Neutrophils Relative %: 49.3 % (ref 43.0–77.0)
Platelets: 269 10*3/uL (ref 150.0–400.0)
RBC: 4.31 Mil/uL (ref 4.22–5.81)
RDW: 13.7 % (ref 11.5–15.5)
WBC: 3.8 10*3/uL — ABNORMAL LOW (ref 4.0–10.5)

## 2021-12-23 LAB — LIPID PANEL
Cholesterol: 160 mg/dL (ref 0–200)
HDL: 51.1 mg/dL (ref >39.00)
LDL Cholesterol: 84 mg/dL (ref 0–99)
NonHDL: 109.25
Total CHOL/HDL Ratio: 3
Triglycerides: 124 mg/dL (ref 0.0–149.0)
VLDL: 24.8 mg/dL (ref 0.0–40.0)

## 2021-12-23 LAB — BASIC METABOLIC PANEL
BUN: 17 mg/dL (ref 6–23)
CO2: 32 mEq/L (ref 19–32)
Calcium: 9.4 mg/dL (ref 8.4–10.5)
Chloride: 100 mEq/L (ref 96–112)
Creatinine, Ser: 1.04 mg/dL (ref 0.40–1.50)
GFR: 72.37 mL/min (ref >60.00)
Glucose, Bld: 98 mg/dL (ref 70–99)
Potassium: 4.1 mEq/L (ref 3.5–5.1)
Sodium: 140 mEq/L (ref 135–145)

## 2021-12-23 LAB — PSA: PSA: 0.98 ng/mL (ref 0.10–4.00)

## 2021-12-23 LAB — VITAMIN D 25 HYDROXY (VIT D DEFICIENCY, FRACTURES): VITD: 29.5 ng/mL — ABNORMAL LOW (ref 30.00–100.00)

## 2021-12-23 LAB — TSH: TSH: 3.24 u[IU]/mL (ref 0.35–5.50)

## 2021-12-23 LAB — VITAMIN B12: Vitamin B-12: 667 pg/mL (ref 211–911)

## 2021-12-23 LAB — HEMOGLOBIN A1C: Hgb A1c MFr Bld: 5.8 % (ref 4.6–6.5)

## 2021-12-26 ENCOUNTER — Encounter: Payer: Self-pay | Admitting: Internal Medicine

## 2021-12-26 ENCOUNTER — Ambulatory Visit (INDEPENDENT_AMBULATORY_CARE_PROVIDER_SITE_OTHER): Payer: Medicare PPO | Admitting: Internal Medicine

## 2021-12-26 ENCOUNTER — Other Ambulatory Visit: Payer: Self-pay

## 2021-12-26 VITALS — BP 126/62 | HR 79 | Ht 71.0 in | Wt 167.0 lb

## 2021-12-26 DIAGNOSIS — M5442 Lumbago with sciatica, left side: Secondary | ICD-10-CM

## 2021-12-26 DIAGNOSIS — I10 Essential (primary) hypertension: Secondary | ICD-10-CM

## 2021-12-26 DIAGNOSIS — R739 Hyperglycemia, unspecified: Secondary | ICD-10-CM

## 2021-12-26 DIAGNOSIS — E782 Mixed hyperlipidemia: Secondary | ICD-10-CM | POA: Diagnosis not present

## 2021-12-26 DIAGNOSIS — E559 Vitamin D deficiency, unspecified: Secondary | ICD-10-CM | POA: Diagnosis not present

## 2021-12-26 DIAGNOSIS — Z0001 Encounter for general adult medical examination with abnormal findings: Secondary | ICD-10-CM | POA: Diagnosis not present

## 2021-12-26 DIAGNOSIS — G8929 Other chronic pain: Secondary | ICD-10-CM

## 2021-12-26 MED ORDER — CYCLOBENZAPRINE HCL 10 MG PO TABS: 10.0000 mg | ORAL_TABLET | Freq: Two times a day (BID) | ORAL | 3 refills | Status: DC | PRN

## 2021-12-26 MED ORDER — DICLOFENAC SODIUM 75 MG PO TBEC: DELAYED_RELEASE_TABLET | ORAL | 3 refills | Status: DC

## 2021-12-26 MED ORDER — LOSARTAN POTASSIUM-HCTZ 100-25 MG PO TABS: 1.0000 | ORAL_TABLET | Freq: Every day | ORAL | 3 refills | Status: DC

## 2021-12-26 MED ORDER — TRAZODONE HCL 100 MG PO TABS: 100.0000 mg | ORAL_TABLET | Freq: Every day | ORAL | 1 refills | Status: DC

## 2021-12-26 NOTE — Progress Notes (Signed)
Patient ID: Carl Trujillo, male   DOB: 05/19/1951, 71 y.o.   MRN: [id number] ? ? ? ?     Chief Complaint:: wellness exam and low vit d, chronic left sciatica ? ?     HPI:  Carl Trujillo is a 71 y.o. male here for wellness exam; declines covid booster, shingirx, pneumovax, and tdap; o/w up to date ?         ?              Also not taking vit d.  Pt denies chest pain, increased sob or doe, wheezing, orthopnea, PND, increased LE swelling, palpitations, dizziness or syncope.   Pt denies polydipsia, polyuria, or new focal neuro s/s.   Pt denies fever, wt loss, night sweats, loss of appetite, or other constitutional symptoms  No other new complaints Pt continues to have recurring left LBP without change in severity, bowel or bladder change, fever, wt loss,  worsening LE pain/numbness/weakness, gait change or falls though still hs recurring left leg pain on top of knee arthritis pain as well.  ?  ?Wt Readings from Last 3 Encounters:  ?12/26/21 167 lb (75.8 kg)  ?10/01/21 160 lb (72.6 kg)  ?04/10/21 164 lb (74.4 kg)  ? ?BP Readings from Last 3 Encounters:  ?12/26/21 126/62  ?10/01/21 138/70  ?07/29/21 (!) 185/100  ? ?Immunization History  ?Administered Date(s) Administered  ? Fluad Quad(high Dose 65+) 06/29/2019, 09/27/2020  ? Influenza Split 06/27/2013, 08/22/2016, 07/13/2017  ? Influenza Whole 07/13/2008, 07/11/2009  ? Influenza, High Dose Seasonal PF 06/24/2018  ? Influenza-Unspecified 07/18/2014, 07/01/2021  ? PFIZER(Purple Top)SARS-COV-2 Vaccination 12/09/2019, 01/03/2020, 07/25/2020, 03/19/2021  ? Pneumococcal Polysaccharide-23 08/22/2016  ? Td 07/13/2008  ? ?There are no preventive care reminders to display for this patient. ? ?  ? ?Past Medical History:  ?Diagnosis Date  ? Anxiety   ? Arthritis of right knee   ? Arthritis of spine   ? ED (erectile dysfunction)   ? Hypertension   ? ?History reviewed. No pertinent surgical history. ? reports that he quit smoking about 24 years ago. His smoking use included cigars. He has  never used smokeless tobacco. He reports current alcohol use of about 3.0 standard drinks per week. He reports that he does not use drugs. ?family history includes CVA in his sister; Cancer in his mother and another family member; Coronary artery disease in an other family member; Post-traumatic stress disorder in his brother. ?No Known Allergies ?Current Outpatient Medications on File Prior to Visit  ?Medication Sig Dispense Refill  ? alprazolam (XANAX) 2 MG tablet TAKE 1/2 TO 1 (ONE-HALF TO ONE) TABLET BY MOUTH THREE TIMES DAILY AS NEEDED FOR ANXIETY AND FOR INSOMNIA 80 tablet 5  ? Ascorbic Acid (VITAMIN C) 1000 MG tablet Take 5,000 mg by mouth daily.     ? aspirin 81 MG tablet Take 81 mg by mouth daily.    ? Cascara Sagrada 450 MG CAPS Take by mouth.    ? cholecalciferol (VITAMIN D) 1000 units tablet Take 1,000 Units by mouth daily.    ? Cod Liver Oil 1000 MG CAPS Take by mouth.    ? Magnesium-Zinc 133.33-5 MG TABS Take by mouth.    ? mupirocin nasal ointment (BACTROBAN) 2 % Place 1 application into the nose 2 (two) times daily. Use one-half of tube in each nostril twice daily for five (5) days. After application, press sides of nose together and gently massage. 10 g 0  ? Omega-3 Fatty Acids (OMEGA 3 PO)  Take by mouth. Omega XL - 2 capsules daily    ? pyridoxine (B-6) 100 MG tablet Take 100 mg by mouth daily.    ? Specialty Vitamins Products (CENTRUM PERFORMANCE) TABS Take by mouth.    ? vitamin B-12 (CYANOCOBALAMIN) 1000 MCG tablet Take 1,000 mcg by mouth daily.    ? ?No current facility-administered medications on file prior to visit.  ? ?     ROS:  All others reviewed and negative. ? ?Objective  ? ?     PE:  BP 126/62 (BP Location: Left Arm, Patient Position: Sitting, Cuff Size: Normal)   Pulse 79   Ht 5\' 11"  (1.803 m)   Wt 167 lb (75.8 kg)   SpO2 99%   BMI 23.29 kg/m?  ? ?              Constitutional: Pt appears in NAD ?              HENT: Head: NCAT.  ?              Right Ear: External ear normal.   ?               Left Ear: External ear normal.  ?              Eyes: . Pupils are equal, round, and reactive to light. Conjunctivae and EOM are normal ?              Nose: without d/c or deformity ?              Neck: Neck supple. Gross normal ROM ?              Cardiovascular: Normal rate and regular rhythm.   ?              Pulmonary/Chest: Effort normal and breath sounds without rales or wheezing.  ?              Abd:  Soft, NT, ND, + BS, no organomegaly ?              Neurological: Pt is alert. At baseline orientation, motor grossly intact ?              Skin: Skin is warm. No rashes, no other new lesions, LE edema - none ?              Psychiatric: Pt behavior is normal without agitation  ? ?Micro: none ? ?Cardiac tracings I have personally interpreted today:  none ? ?Pertinent Radiological findings (summarize): none  ? ?Lab Results  ?Component Value Date  ? WBC 3.8 (L) 12/23/2021  ? HGB 13.7 12/23/2021  ? HCT 39.6 12/23/2021  ? PLT 269.0 12/23/2021  ? GLUCOSE 98 12/23/2021  ? CHOL 160 12/23/2021  ? TRIG 124.0 12/23/2021  ? HDL 51.10 12/23/2021  ? LDLCALC 84 12/23/2021  ? ALT 20 12/23/2021  ? AST 20 12/23/2021  ? NA 140 12/23/2021  ? K 4.1 12/23/2021  ? CL 100 12/23/2021  ? CREATININE 1.04 12/23/2021  ? BUN 17 12/23/2021  ? CO2 32 12/23/2021  ? TSH 3.24 12/23/2021  ? PSA 0.98 12/23/2021  ? HGBA1C 5.8 12/23/2021  ? ?Assessment/Plan:  ?Carl Trujillo is a 71 y.o. Black or African American [2] male with  has a past medical history of Anxiety, Arthritis of right knee, Arthritis of spine, ED (erectile dysfunction), and Hypertension. ? ?Encounter for well adult exam with abnormal findings ?Age and sex  appropriate education and counseling updated with regular exercise and diet ?Referrals for preventative services - none needed ?Immunizations addressed - declines covid booster, shingrix, pneumova, tdap ?Smoking counseling  - none needed ?Evidence for depression or other mood disorder - none significant ?Most recent labs  reviewed. ?I have personally reviewed and have noted: ?1) the patient's medical and social history ?2) The patient's current medications and supplements ?3) The patient's height, weight, and BMI have been recorded in the chart ? ? ?Mixed hyperlipidemia ?Lab Results  ?Component Value Date  ? LDLCALC 84 12/23/2021  ? ?Stable, pt to continue current low chol diet ? ? ?Hyperglycemia ?Lab Results  ?Component Value Date  ? HGBA1C 5.8 12/23/2021  ? ?Stable, pt to continue current medical treatment - diet ? ? ?Essential hypertension ?BP Readings from Last 3 Encounters:  ?12/26/21 126/62  ?10/01/21 138/70  ?07/29/21 (!) 185/100  ? ?Stable, pt to continue medical treatment hyzaar ? ? ?LOW BACK PAIN, CHRONIC ?Stable overall, cont  Current med tx ? ?Vitamin D deficiency ?Last vitamin D ?Lab Results  ?Component Value Date  ? VD25OH 29.50 (L) 12/23/2021  ? ?Low, to start oral replacement ? ?Followup: No follow-ups on file. ? ?Oliver Barre, MD 12/31/2021 11:32 AM ?[facility] ?[facility] ?Internal Medicine ?

## 2021-12-26 NOTE — Patient Instructions (Addendum)
Please take OTC Vitamin D3 at 2000 units per day, indefinitely  Please continue all other medications as before, and refills have been done if requested.  Please have the pharmacy call with any other refills you may need.  Please continue your efforts at being more active, low cholesterol diet, and weight control.  You are otherwise up to date with prevention measures today.  Please keep your appointments with your specialists as you may have planned  Please make an Appointment to return in 6 months, or sooner if needed, also with Lab Appointment for testing done 3-5 days before at the FIRST FLOOR Lab (so this is for TWO appointments - please see the scheduling desk as you leave)  Due to the ongoing Covid 19 pandemic, our lab now requires an appointment for any labs done at our office.  If you need labs done and do not have an appointment, please call our office ahead of time to schedule before presenting to the lab for your testing.   

## 2021-12-26 NOTE — Progress Notes (Signed)
Patient ID: Carl Trujillo, male   DOB: 1951/07/30, 71 y.o.   MRN: [id number] ? ? ?

## 2021-12-31 DIAGNOSIS — E559 Vitamin D deficiency, unspecified: Secondary | ICD-10-CM | POA: Insufficient documentation

## 2021-12-31 NOTE — Assessment & Plan Note (Signed)
Stable overall, cont  Current med tx ?

## 2021-12-31 NOTE — Assessment & Plan Note (Signed)
Lab Results  ?Component Value Date  ? LDLCALC 84 12/23/2021  ? ?Stable, pt to continue current low chol diet ? ?

## 2021-12-31 NOTE — Assessment & Plan Note (Signed)
Age and sex appropriate education and counseling updated with regular exercise and diet ?Referrals for preventative services - none needed ?Immunizations addressed - declines covid booster, shingrix, pneumova, tdap ?Smoking counseling  - none needed ?Evidence for depression or other mood disorder - none significant ?Most recent labs reviewed. ?I have personally reviewed and have noted: ?1) the patient's medical and social history ?2) The patient's current medications and supplements ?3) The patient's height, weight, and BMI have been recorded in the chart ? ?

## 2021-12-31 NOTE — Assessment & Plan Note (Signed)
Last vitamin D ?Lab Results  ?Component Value Date  ? VD25OH 29.50 (L) 12/23/2021  ? ?Low, to start oral replacement ? ?

## 2021-12-31 NOTE — Assessment & Plan Note (Signed)
BP Readings from Last 3 Encounters:  ?12/26/21 126/62  ?10/01/21 138/70  ?07/29/21 (!) 185/100  ? ?Stable, pt to continue medical treatment hyzaar ? ?

## 2021-12-31 NOTE — Assessment & Plan Note (Signed)
Lab Results  ?Component Value Date  ? HGBA1C 5.8 12/23/2021  ? ?Stable, pt to continue current medical treatment - diet ? ?

## 2022-02-13 ENCOUNTER — Encounter: Payer: Self-pay | Admitting: Psychiatry

## 2022-02-13 ENCOUNTER — Ambulatory Visit: Payer: Medicare PPO | Admitting: Psychiatry

## 2022-02-13 DIAGNOSIS — F411 Generalized anxiety disorder: Secondary | ICD-10-CM | POA: Diagnosis not present

## 2022-02-13 MED ORDER — ALPRAZOLAM 2 MG PO TABS: ORAL_TABLET | ORAL | 5 refills | Status: DC

## 2022-02-13 NOTE — Progress Notes (Signed)
Markus Jarvis ?[id number] ?1951-09-08 ?71 y.o. ? ?Subjective:  ? ?Patient ID:  Carl Trujillo is a 71 y.o. (DOB 09/16/1951) male. ? ?Chief Complaint:  ?Chief Complaint  ?Patient presents with  ? Follow-up  ?  Anxiety  ? ? ?HPI ?Carl Trujillo presents to the office today for follow-up of anxiety. He reports that he stays at home for the most part and this is helpful for his anxiety. He reports that he eats 3 meals daily. He reports that he is trying to decrease intake of meat. He reports certain rituals around self-care. He reports that he is able to take less Xanax at times, ie. 1/4 and 1/2 tab. Denies depression. He reports that he is sleeping well. He typically sleeps until 12 noon. Energy and motivation have been good. He reports that he tries to engage his mind routinely to maintain cognition. He reports adequate concentration. He reports that he regularly reads news feed on his phone. Denies SI.  ? ?He reports that he lost another brother last month and has had sadness in response to this. Mother died when he was 85 yo.  ? ?Xanax last filled on 01/12/22.  ? ?Mini-Mental   ? ?Flowsheet Row Clinical Support from 03/17/2018 in [facility]  ?Total Score (max 30 points ) 29  ? ?  ? ?PHQ2-9   ? ?Flowsheet Row Office Visit from 12/26/2021 in [facility] Visit from 12/06/2020 in [facility] Clinical Support from 03/28/2020 in [facility] Visit from 12/21/2019 in [facility] at [employer] Clinical Support from 03/17/2018 in [facility]  ?PHQ-2 Total Score 0 0 0 1 0  ?PHQ-9 Total Score -- -- -- -- 0  ? ?  ? ?Flowsheet Row ED from 07/29/2021 in [facility] ED from 07/11/2021 in [facility] EMERGENCY DEPARTMENT  ?C-SSRS RISK CATEGORY Error: Question 6 not populated No Risk  ? ?  ?  ? ?Review of Systems:  ?Review of Systems  ?Gastrointestinal: Negative.    ?Musculoskeletal:  Negative for gait problem.  ?Neurological:  Negative for tremors.  ?Psychiatric/Behavioral:    ?     Please refer to HPI  ? ?Medications: I have reviewed the patient's current medications. ? ?Current Outpatient Medications  ?Medication Sig Dispense Refill  ? alprazolam (XANAX) 2 MG tablet TAKE 1/2 TO 1 (ONE-HALF TO ONE) TABLET BY MOUTH THREE TIMES DAILY AS NEEDED FOR ANXIETY AND FOR INSOMNIA 80 tablet 5  ? Ascorbic Acid (VITAMIN C) 1000 MG tablet Take 5,000 mg by mouth daily.     ? aspirin 81 MG tablet Take 81 mg by mouth daily.    ? Cascara Sagrada 450 MG CAPS Take by mouth.    ? cholecalciferol (VITAMIN D) 1000 units tablet Take 1,000 Units by mouth daily.    ? Cod Liver Oil 1000 MG CAPS Take by mouth.    ? cyclobenzaprine (FLEXERIL) 10 MG tablet Take 1 tablet (10 mg total) by mouth 2 (two) times daily as needed. 180 tablet 3  ? diclofenac (VOLTAREN) 75 MG EC tablet 1 tab by moth twice per day as needed 180 tablet 3  ? losartan-hydrochlorothiazide (HYZAAR) 100-25 MG tablet Take 1 tablet by mouth daily. 90 tablet 3  ? Magnesium-Zinc 133.33-5 MG TABS Take by mouth.    ? mupirocin nasal ointment (BACTROBAN) 2 % Place 1 application into the nose 2 (two) times daily. Use one-half  of tube in each nostril twice daily for five (5) days. After application, press sides of nose together and gently massage. 10 g 0  ? Omega-3 Fatty Acids (OMEGA 3 PO) Take by mouth. Omega XL - 2 capsules daily    ? pyridoxine (B-6) 100 MG tablet Take 100 mg by mouth daily.    ? Specialty Vitamins Products (CENTRUM PERFORMANCE) TABS Take by mouth.    ? traZODone (DESYREL) 100 MG tablet Take 1 tablet (100 mg total) by mouth at bedtime. 90 tablet 1  ? vitamin B-12 (CYANOCOBALAMIN) 1000 MCG tablet Take 1,000 mcg by mouth daily.    ? ?No current facility-administered medications for this visit.  ? ? ?Medication Side Effects: None ? ?Allergies: No Known Allergies ? ?Past Medical History:  ?Diagnosis Date  ? Anxiety   ? Arthritis  of right knee   ? Arthritis of spine   ? ED (erectile dysfunction)   ? Hypertension   ? ? ?Past Medical History, Surgical history, Social history, and Family history were reviewed and updated as appropriate.  ? ?Please see review of systems for further details on the patient's review from today.  ? ?Objective:  ? ?Physical Exam:  ?Wt 164 lb (74.4 kg)   BMI 22.87 kg/m?  ? ?Physical Exam ?Constitutional:   ?   General: He is not in acute distress. ?Musculoskeletal:     ?   General: No deformity.  ?Neurological:  ?   Mental Status: He is alert and oriented to person, place, and time.  ?   Coordination: Coordination normal.  ?Psychiatric:     ?   Attention and Perception: Attention and perception normal. He does not perceive auditory or visual hallucinations.     ?   Mood and Affect: Mood normal. Mood is not anxious or depressed. Affect is not labile, blunt, angry or inappropriate.     ?   Speech: Speech normal.     ?   Behavior: Behavior normal.     ?   Thought Content: Thought content normal. Thought content is not paranoid or delusional. Thought content does not include homicidal or suicidal ideation. Thought content does not include homicidal or suicidal plan.     ?   Cognition and Memory: Cognition and memory normal.     ?   Judgment: Judgment normal.  ?   Comments: Insight intact  ? ? ?Lab Review:  ?   ?Component Value Date/Time  ? NA 140 12/23/2021 1242  ? K 4.1 12/23/2021 1242  ? CL 100 12/23/2021 1242  ? CO2 32 12/23/2021 1242  ? GLUCOSE 98 12/23/2021 1242  ? BUN 17 12/23/2021 1242  ? CREATININE 1.04 12/23/2021 1242  ? CALCIUM 9.4 12/23/2021 1242  ? PROT 6.3 12/23/2021 1242  ? ALBUMIN 4.2 12/23/2021 1242  ? AST 20 12/23/2021 1242  ? ALT 20 12/23/2021 1242  ? ALKPHOS 73 12/23/2021 1242  ? BILITOT 0.9 12/23/2021 1242  ? GFRNONAA 88.07 11/14/2009 1036  ? ? ?   ?Component Value Date/Time  ? WBC 3.8 (L) 12/23/2021 1242  ? RBC 4.31 12/23/2021 1242  ? HGB 13.7 12/23/2021 1242  ? HCT 39.6 12/23/2021 1242  ? PLT 269.0  12/23/2021 1242  ? MCV 92.0 12/23/2021 1242  ? MCHC 34.6 12/23/2021 1242  ? RDW 13.7 12/23/2021 1242  ? LYMPHSABS 1.2 12/23/2021 1242  ? MONOABS 0.4 12/23/2021 1242  ? EOSABS 0.3 12/23/2021 1242  ? BASOSABS 0.0 12/23/2021 1242  ? ? ?No results found for: POCLITH,  LITHIUM  ? ?No results found for: PHENYTOIN, PHENOBARB, VALPROATE, CBMZ  ? ?.res ?Assessment: Plan:   ? ?Will continue current plan of care since target signs and symptoms are well controlled without any tolerability issues. ?Continue Xanax 2 mg 1/2-1 tab po TID prn anxiety. ?Pt to follow-up in 6 months or sooner if clinically indicated.  ?Patient advised to contact office with any questions, adverse effects, or acute worsening in signs and symptoms. ? ? ?[name] was seen today for follow-up. ? ?Diagnoses and all orders for this visit: ? ?Anxiety state ?-     alprazolam (XANAX) 2 MG tablet; TAKE 1/2 TO 1 (ONE-HALF TO ONE) TABLET BY MOUTH THREE TIMES DAILY AS NEEDED FOR ANXIETY AND FOR INSOMNIA ? ?  ? ?Please see After Visit Summary for patient specific instructions. ? ?Future Appointments  ?Date Time Provider Department Center  ?08/15/2022 11:00 AM Corie Chiquitoarter, Marai Teehan, PMHNP [facility] None  ? ? ?No orders of the defined types were placed in this encounter. ? ? ?------------------------------- ?

## 2022-05-09 ENCOUNTER — Ambulatory Visit (INDEPENDENT_AMBULATORY_CARE_PROVIDER_SITE_OTHER): Payer: Medicare PPO

## 2022-05-09 DIAGNOSIS — Z0000 Encounter for general adult medical examination without abnormal findings: Secondary | ICD-10-CM | POA: Diagnosis not present

## 2022-05-09 NOTE — Patient Instructions (Signed)
[name] , Thank you for taking time to come for your Medicare Wellness Visit. I appreciate your ongoing commitment to your health goals. Please review the following plan we discussed and let me know if I can assist you in the future.   Screening recommendations/referrals: Colonoscopy: 02/25/2018; due every 10 years (due 02/26/2028) Recommended yearly ophthalmology/optometry visit for glaucoma screening and checkup Recommended yearly dental visit for hygiene and checkup  Vaccinations: Influenza vaccine: 07/01/2021 Pneumococcal vaccine: 07/22/2016 Tdap vaccine: 07/13/2008; due every 10 years (due 07/13/2018; overdue) Shingles vaccine: never done   Covid-19: 12/09/2019,01/03/2020, 07/25/2020, 03/19/2021  Advanced directives: No; in the process.  Conditions/risks identified: Yes  Next appointment: Please schedule your next Medicare Wellness Visit with your Nurse Health Advisor in 1 year by calling [phone].  Preventive Care 21 Years and Older, Male Preventive care refers to lifestyle choices and visits with your health care provider that can promote health and wellness. What does preventive care include? A yearly physical exam. This is also called an annual well check. Dental exams once or twice a year. Routine eye exams. Ask your health care provider how often you should have your eyes checked. Personal lifestyle choices, including: Daily care of your teeth and gums. Regular physical activity. Eating a healthy diet. Avoiding tobacco and drug use. Limiting alcohol use. Practicing safe sex. Taking low doses of aspirin every day. Taking vitamin and mineral supplements as recommended by your health care provider. What happens during an annual well check? The services and screenings done by your health care provider during your annual well check will depend on your age, overall health, lifestyle risk factors, and family history of disease. Counseling  Your health care provider may ask  you questions about your: Alcohol use. Tobacco use. Drug use. Emotional well-being. Home and relationship well-being. Sexual activity. Eating habits. History of falls. Memory and ability to understand (cognition). Work and work Astronomer. Screening  You may have the following tests or measurements: Height, weight, and BMI. Blood pressure. Lipid and cholesterol levels. These may be checked every 5 years, or more frequently if you are over 1 years old. Skin check. Lung cancer screening. You may have this screening every year starting at age 16 if you have a 30-pack-year history of smoking and currently smoke or have quit within the past 15 years. Fecal occult blood test (FOBT) of the stool. You may have this test every year starting at age 63. Flexible sigmoidoscopy or colonoscopy. You may have a sigmoidoscopy every 5 years or a colonoscopy every 10 years starting at age 50. Prostate cancer screening. Recommendations will vary depending on your family history and other risks. Hepatitis C blood test. Hepatitis B blood test. Sexually transmitted disease (STD) testing. Diabetes screening. This is done by checking your blood sugar (glucose) after you have not eaten for a while (fasting). You may have this done every 1-3 years. Abdominal aortic aneurysm (AAA) screening. You may need this if you are a current or former smoker. Osteoporosis. You may be screened starting at age 35 if you are at high risk. Talk with your health care provider about your test results, treatment options, and if necessary, the need for more tests. Vaccines  Your health care provider may recommend certain vaccines, such as: Influenza vaccine. This is recommended every year. Tetanus, diphtheria, and acellular pertussis (Tdap, Td) vaccine. You may need a Td booster every 10 years. Zoster vaccine. You may need this after age 27. Pneumococcal 13-valent conjugate (PCV13) vaccine. One dose is recommended  after age  61. Pneumococcal polysaccharide (PPSV23) vaccine. One dose is recommended after age 62. Talk to your health care provider about which screenings and vaccines you need and how often you need them. This information is not intended to replace advice given to you by your health care provider. Make sure you discuss any questions you have with your health care provider. Document Released: 10/26/2015 Document Revised: 06/18/2016 Document Reviewed: 07/31/2015 Elsevier Interactive Patient Education  2017 [place] Prevention in the Home Falls can cause injuries. They can happen to people of all ages. There are many things you can do to make your home safe and to help prevent falls. What can I do on the outside of my home? Regularly fix the edges of walkways and driveways and fix any cracks. Remove anything that might make you trip as you walk through a door, such as a raised step or threshold. Trim any bushes or trees on the path to your home. Use bright outdoor lighting. Clear any walking paths of anything that might make someone trip, such as rocks or tools. Regularly check to see if handrails are loose or broken. Make sure that both sides of any steps have handrails. Any raised decks and porches should have guardrails on the edges. Have any leaves, snow, or ice cleared regularly. Use sand or salt on walking paths during winter. Clean up any spills in your garage right away. This includes oil or grease spills. What can I do in the bathroom? Use night lights. Install grab bars by the toilet and in the tub and shower. Do not use towel bars as grab bars. Use non-skid mats or decals in the tub or shower. If you need to sit down in the shower, use a plastic, non-slip stool. Keep the floor dry. Clean up any water that spills on the floor as soon as it happens. Remove soap buildup in the tub or shower regularly. Attach bath mats securely with double-sided non-slip rug tape. Do not have throw  rugs and other things on the floor that can make you trip. What can I do in the bedroom? Use night lights. Make sure that you have a light by your bed that is easy to reach. Do not use any sheets or blankets that are too big for your bed. They should not hang down onto the floor. Have a firm chair that has side arms. You can use this for support while you get dressed. Do not have throw rugs and other things on the floor that can make you trip. What can I do in the kitchen? Clean up any spills right away. Avoid walking on wet floors. Keep items that you use a lot in easy-to-reach places. If you need to reach something above you, use a strong step stool that has a grab bar. Keep electrical cords out of the way. Do not use floor polish or wax that makes floors slippery. If you must use wax, use non-skid floor wax. Do not have throw rugs and other things on the floor that can make you trip. What can I do with my stairs? Do not leave any items on the stairs. Make sure that there are handrails on both sides of the stairs and use them. Fix handrails that are broken or loose. Make sure that handrails are as long as the stairways. Check any carpeting to make sure that it is firmly attached to the stairs. Fix any carpet that is loose or worn. Avoid having throw  rugs at the top or bottom of the stairs. If you do have throw rugs, attach them to the floor with carpet tape. Make sure that you have a light switch at the top of the stairs and the bottom of the stairs. If you do not have them, ask someone to add them for you. What else can I do to help prevent falls? Wear shoes that: Do not have high heels. Have rubber bottoms. Are comfortable and fit you well. Are closed at the toe. Do not wear sandals. If you use a stepladder: Make sure that it is fully opened. Do not climb a closed stepladder. Make sure that both sides of the stepladder are locked into place. Ask someone to hold it for you, if  possible. Clearly mark and make sure that you can see: Any grab bars or handrails. First and last steps. Where the edge of each step is. Use tools that help you move around (mobility aids) if they are needed. These include: Canes. Walkers. Scooters. Crutches. Turn on the lights when you go into a dark area. Replace any light bulbs as soon as they burn out. Set up your furniture so you have a clear path. Avoid moving your furniture around. If any of your floors are uneven, fix them. If there are any pets around you, be aware of where they are. Review your medicines with your doctor. Some medicines can make you feel dizzy. This can increase your chance of falling. Ask your doctor what other things that you can do to help prevent falls. This information is not intended to replace advice given to you by your health care provider. Make sure you discuss any questions you have with your health care provider. Document Released: 07/26/2009 Document Revised: 03/06/2016 Document Reviewed: 11/03/2014 Elsevier Interactive Patient Education  2017 [employer].

## 2022-05-09 NOTE — Progress Notes (Signed)
I connected with Carl JarvisJames Trujillo today by telephone and verified that I am speaking with the correct person using two identifiers. Location patient: home Location provider: work Persons participating in the virtual visit: patient, provider.   I discussed the limitations, risks, security and privacy concerns of performing an evaluation and management service by telephone and the availability of in person appointments. I also discussed with the patient that there may be a patient responsible charge related to this service. The patient expressed understanding and verbally consented to this telephonic visit.    Interactive audio and video telecommunications were attempted between this provider and patient, however failed, due to patient having technical difficulties OR patient did not have access to video capability.  We continued and completed visit with audio only.  Some vital signs may be absent or patient reported.   Time Spent with patient on telephone encounter: 30 minutes  Subjective:   Carl Trujillo is a 71 y.o. male who presents for Medicare Annual/Subsequent preventive examination.  Review of Systems     Cardiac Risk Factors include: advanced age (>10855men, 6>65 women);dyslipidemia;hypertension;male gender;family history of premature cardiovascular disease     Objective:    There were no vitals filed for this visit. There is no height or weight on file to calculate BMI.     05/09/2022    1:40 PM 03/28/2020    2:29 PM 03/17/2018    4:22 PM  Advanced Directives  Does Patient Have a Medical Advance Directive? No Yes Yes  Type of Special educational needs teacherAdvance Directive  Healthcare Power of DanielAttorney;Living will Healthcare Power of WastaAttorney;Living will  Does patient want to make changes to medical advance directive?  No - Patient declined   Copy of Healthcare Power of Attorney in Chart?  No - copy requested No - copy requested  Would patient like information on creating a medical advance directive? No - Patient  declined      Current Medications (verified) Outpatient Encounter Medications as of 05/09/2022  Medication Sig   alprazolam (XANAX) 2 MG tablet TAKE 1/2 TO 1 (ONE-HALF TO ONE) TABLET BY MOUTH THREE TIMES DAILY AS NEEDED FOR ANXIETY AND FOR INSOMNIA   Ascorbic Acid (VITAMIN C) 1000 MG tablet Take 5,000 mg by mouth daily.    aspirin 81 MG tablet Take 81 mg by mouth daily.   [place] 450 MG CAPS Take by mouth.   cholecalciferol (VITAMIN D) 1000 units tablet Take 1,000 Units by mouth daily.   Cod Liver Oil 1000 MG CAPS Take by mouth.   cyclobenzaprine (FLEXERIL) 10 MG tablet Take 1 tablet (10 mg total) by mouth 2 (two) times daily as needed.   diclofenac (VOLTAREN) 75 MG EC tablet 1 tab by moth twice per day as needed   losartan-hydrochlorothiazide (HYZAAR) 100-25 MG tablet Take 1 tablet by mouth daily.   Magnesium-Zinc 133.33-5 MG TABS Take by mouth.   mupirocin nasal ointment (BACTROBAN) 2 % Place 1 application into the nose 2 (two) times daily. Use one-half of tube in each nostril twice daily for five (5) days. After application, press sides of nose together and gently massage.   Omega-3 Fatty Acids (OMEGA 3 PO) Take by mouth. Omega XL - 2 capsules daily   pyridoxine (B-6) 100 MG tablet Take 100 mg by mouth daily.   Specialty Vitamins Products (CENTRUM PERFORMANCE) TABS Take by mouth.   traZODone (DESYREL) 100 MG tablet Take 1 tablet (100 mg total) by mouth at bedtime.   vitamin B-12 (CYANOCOBALAMIN) 1000 MCG tablet Take 1,000  mcg by mouth daily.   No facility-administered encounter medications on file as of 05/09/2022.    Allergies (verified) Patient has no known allergies.   History: Past Medical History:  Diagnosis Date   Anxiety    Arthritis of right knee    Arthritis of spine    ED (erectile dysfunction)    Hypertension    History reviewed. No pertinent surgical history. Family History  Problem Relation Age of Onset   Cancer Other    Coronary artery disease Other     CVA Sister    Cancer Mother    Post-traumatic stress disorder Brother    Social History   Socioeconomic History   Marital status: Single    Spouse name: Not on file   Number of children: Not on file   Years of education: Not on file   Highest education level: Not on file  Occupational History   Occupation: retired  Tobacco Use   Smoking status: Former    Types: Cigars    Quit date: 10/13/1997    Years since quitting: 24.5   Smokeless tobacco: Never   Tobacco comments:    current smoker of cigars in the pm - stopped cigarettes 9yrs ago  Vaping Use   Vaping Use: Never used  Substance and Sexual Activity   Alcohol use: Yes    Alcohol/week: 3.0 standard drinks of alcohol    Types: 3 Glasses of wine per week   Drug use: No   Sexual activity: Not Currently  Other Topics Concern   Not on file  Social History Narrative   Not on file   Social Determinants of Health   Financial Resource Strain: Low Risk  (05/09/2022)   Overall Financial Resource Strain (CARDIA)    Difficulty of Paying Living Expenses: Not hard at all  Food Insecurity: No Food Insecurity (05/09/2022)   Hunger Vital Sign    Worried About Running Out of Food in the Last Year: Never true    Ran Out of Food in the Last Year: Never true  Transportation Needs: No Transportation Needs (05/09/2022)   PRAPARE - Administrator, Civil Service (Medical): No    Lack of Transportation (Non-Medical): No  Physical Activity: Sufficiently Active (05/09/2022)   Exercise Vital Sign    Days of Exercise per Week: 5 days    Minutes of Exercise per Session: 60 min  Stress: No Stress Concern Present (05/09/2022)   [employer] of Occupational Health - Occupational Stress Questionnaire    Feeling of Stress : Not at all  Social Connections: Moderately Integrated (05/09/2022)   Social Connection and Isolation Panel [NHANES]    Frequency of Communication with Friends and Family: More than three times a week    Frequency  of Social Gatherings with Friends and Family: More than three times a week    Attends Religious Services: More than 4 times per year    Active Member of [employer] or Organizations: Yes    Attends Engineer, structural: More than 4 times per year    Marital Status: Never married    Tobacco Counseling Counseling given: Not Answered Tobacco comments: current smoker of cigars in the pm - stopped cigarettes 52yrs ago   Clinical Intake:  Pre-visit preparation completed: Yes  Pain : No/denies pain     Nutritional Risks: None Diabetes: No  How often do you need to have someone help you when you read instructions, pamphlets, or other written materials from your doctor or pharmacy?: 1 -  Never What is the last grade level you completed in school?: HSG; some college  Diabetic? no  Interpreter Needed?: No  Information entered by :: Susie Cassette, LPN.   Activities of Daily Living    05/09/2022    1:51 PM  In your present state of health, do you have any difficulty performing the following activities:  Hearing? 0  Vision? 0  Difficulty concentrating or making decisions? 0  Walking or climbing stairs? 0  Dressing or bathing? 0  Doing errands, shopping? 0  Preparing Food and eating ? N  Using the Toilet? N  In the past six months, have you accidently leaked urine? N  Do you have problems with loss of bowel control? N  Managing your Medications? N  Managing your Finances? N  Housekeeping or managing your Housekeeping? N    Patient Care Team: Corwin Levins, MD as PCP - General (Internal Medicine) Jerilee Field, MD as Consulting Physician (Urology) Waymon Budge, MD as Consulting Physician (Pulmonary Disease)  Indicate any recent Medical Services you may have received from other than [facility] providers in the past year (date may be approximate).     Assessment:   This is a routine wellness examination for Carl Trujillo.  Hearing/Vision screen Hearing Screening -  Comments:: Patient denied any hearing difficulty.   No hearing aids.  Vision Screening - Comments:: Patient does wear readers.     Dietary issues and exercise activities discussed: Current Exercise Habits: Home exercise routine, Type of exercise: walking;Other - see comments (yard work), Time (Minutes): 30, Frequency (Times/Week): 7, Weekly Exercise (Minutes/Week): 210, Intensity: Moderate, Exercise limited by: orthopedic condition(s)   Goals Addressed             This Visit's Progress    To maintain my health and weight.        Depression Screen    05/09/2022    1:36 PM 12/26/2021   12:18 PM 12/26/2021   11:43 AM 12/06/2020   11:19 AM 03/28/2020    4:12 PM 12/21/2019    4:39 PM 03/17/2018    4:44 PM  PHQ 2/9 Scores  PHQ - 2 Score 0 0 0 0 0 1 0  PHQ- 9 Score       0    Fall Risk    05/09/2022    1:37 PM 12/26/2021   12:18 PM 12/26/2021   11:43 AM 12/06/2020   11:20 AM 03/28/2020    4:11 PM  Fall Risk   Falls in the past year? 0 0 0 0 0  Number falls in past yr: 0 0 0 0 0  Injury with Fall? 0  0 0 0  Risk for fall due to : No Fall Risks   No Fall Risks No Fall Risks  Follow up Falls evaluation completed   Falls evaluation completed Falls evaluation completed;Education provided    FALL RISK PREVENTION PERTAINING TO THE HOME:  Any stairs in or around the home? No  If so, are there any without handrails? No  Home free of loose throw rugs in walkways, pet beds, electrical cords, etc? Yes  Adequate lighting in your home to reduce risk of falls? Yes   ASSISTIVE DEVICES UTILIZED TO PREVENT FALLS:  Life alert? No  Use of a cane, walker or w/c? No  Grab bars in the bathroom? Yes  Shower chair or bench in shower? No  Elevated toilet seat or a handicapped toilet? No   TIMED UP AND GO:  Was the test performed?  No .  Length of time to ambulate 10 feet: n/a sec.   Appearance of gait: Gait not evaluated during this visit.  Cognitive Function:    03/17/2018    4:46 PM   MMSE - Mini Mental State Exam  Orientation to time 5  Orientation to Place 5  Registration 3  Attention/ Calculation 5  Recall 2  Language- name 2 objects 2  Language- repeat 1  Language- follow 3 step command 3  Language- read & follow direction 1  Write a sentence 1  Copy design 1  Total score 29        05/09/2022    1:40 PM 03/28/2020    4:15 PM  6CIT Screen  What Year? 0 points 0 points  What month? 0 points 0 points  What time? 0 points 0 points  Count back from 20 0 points 0 points  Months in reverse 0 points 0 points  Repeat phrase 0 points 0 points  Total Score 0 points 0 points    Immunizations Immunization History  Administered Date(s) Administered   Fluad Quad(high Dose 65+) 06/29/2019, 09/27/2020   Influenza Split 06/27/2013, 08/22/2016, 07/13/2017   Influenza Whole 07/13/2008, 07/11/2009   Influenza, High Dose Seasonal PF 06/24/2018   Influenza-Unspecified 07/18/2014, 07/01/2021   PFIZER(Purple Top)SARS-COV-2 Vaccination 12/09/2019, 01/03/2020, 07/25/2020, 03/19/2021   Pneumococcal Polysaccharide-23 08/22/2016   Td 07/13/2008    TDAP status: Due, Education has been provided regarding the importance of this vaccine. Advised may receive this vaccine at local pharmacy or Health Dept. Aware to provide a copy of the vaccination record if obtained from local pharmacy or Health Dept. Verbalized acceptance and understanding.  Flu Vaccine status: Up to date  Pneumococcal vaccine status: Due, Education has been provided regarding the importance of this vaccine. Advised may receive this vaccine at local pharmacy or Health Dept. Aware to provide a copy of the vaccination record if obtained from local pharmacy or Health Dept. Verbalized acceptance and understanding.  Covid-19 vaccine status: Completed vaccines  Qualifies for Shingles Vaccine? Yes   Zostavax completed No   Shingrix Completed?: No.    Education has been provided regarding the importance of this  vaccine. Patient has been advised to call insurance company to determine out of pocket expense if they have not yet received this vaccine. Advised may also receive vaccine at local pharmacy or Health Dept. Verbalized acceptance and understanding.  Screening Tests Health Maintenance  Topic Date Due   Zoster Vaccines- Shingrix (1 of 2) Never done   COVID-19 Vaccine (5 - Pfizer series) 05/14/2021   Pneumonia Vaccine 110+ Years old (2 - PCV) 12/27/2022 (Originally 08/22/2017)   TETANUS/TDAP  12/27/2022 (Originally 07/13/2018)   INFLUENZA VACCINE  05/13/2022   COLONOSCOPY (Pts 45-24yrs Insurance coverage will need to be confirmed)  01/22/2028   Hepatitis C Screening  Completed   HPV VACCINES  Aged Out    Health Maintenance  Health Maintenance Due  Topic Date Due   Zoster Vaccines- Shingrix (1 of 2) Never done   COVID-19 Vaccine (5 - Pfizer series) 05/14/2021    Colorectal cancer screening: Type of screening: Colonoscopy. Completed 02/25/2018. Repeat every 10 years  Lung Cancer Screening: (Low Dose CT Chest recommended if Age 22-80 years, 30 pack-year currently smoking OR have quit w/in 15years.) does not qualify.   Lung Cancer Screening Referral: no  Additional Screening:  Hepatitis C Screening: does qualify; Completed 11/24/2016  Vision Screening: Recommended annual ophthalmology exams for early detection of glaucoma and other disorders of  the eye. Is the patient up to date with their annual eye exam?  Yes  Who is the provider or what is the name of the office in which the patient attends annual eye exams? No Optometrist due to insurance If pt is not established with a provider, would they like to be referred to a provider to establish care? No .   Dental Screening: Recommended annual dental exams for proper oral hygiene  Community Resource Referral / Chronic Care Management: CRR required this visit?  No   CCM required this visit?  No      Plan:     I have personally  reviewed and noted the following in the patient's chart:   Medical and social history Use of alcohol, tobacco or illicit drugs  Current medications and supplements including opioid prescriptions. Patient is not currently taking opioid prescriptions. Functional ability and status Nutritional status Physical activity Advanced directives List of other physicians Hospitalizations, surgeries, and ER visits in previous 12 months Vitals Screenings to include cognitive, depression, and falls Referrals and appointments  In addition, I have reviewed and discussed with patient certain preventive protocols, quality metrics, and best practice recommendations. A written personalized care plan for preventive services as well as general preventive health recommendations were provided to patient.     Mickeal Needy, LPN   2/29/7989   Nurse Notes:  Patient is cogitatively intact. There were no vitals filed for this visit. There is no height or weight on file to calculate BMI. Patient stated that he has no issues with gait or balance; does not use any assistive devices. Medications reviewed with patient; no opioid use noted.

## 2022-05-13 DIAGNOSIS — E785 Hyperlipidemia, unspecified: Secondary | ICD-10-CM

## 2022-05-13 HISTORY — DX: Hyperlipidemia, unspecified: E78.5

## 2022-07-01 ENCOUNTER — Ambulatory Visit (INDEPENDENT_AMBULATORY_CARE_PROVIDER_SITE_OTHER): Payer: Medicare PPO | Admitting: Internal Medicine

## 2022-07-01 ENCOUNTER — Encounter: Payer: Self-pay | Admitting: Internal Medicine

## 2022-07-01 VITALS — BP 124/70 | HR 62 | Temp 97.8°F | Ht 71.0 in | Wt 164.0 lb

## 2022-07-01 DIAGNOSIS — Z23 Encounter for immunization: Secondary | ICD-10-CM | POA: Diagnosis not present

## 2022-07-01 DIAGNOSIS — I10 Essential (primary) hypertension: Secondary | ICD-10-CM

## 2022-07-01 DIAGNOSIS — R9431 Abnormal electrocardiogram [ECG] [EKG]: Secondary | ICD-10-CM

## 2022-07-01 DIAGNOSIS — R739 Hyperglycemia, unspecified: Secondary | ICD-10-CM

## 2022-07-01 DIAGNOSIS — E782 Mixed hyperlipidemia: Secondary | ICD-10-CM

## 2022-07-01 DIAGNOSIS — E559 Vitamin D deficiency, unspecified: Secondary | ICD-10-CM

## 2022-07-01 LAB — LIPID PANEL
Cholesterol: 159 mg/dL (ref 0–200)
HDL: 55.9 mg/dL (ref >39.00)
LDL Cholesterol: 80 mg/dL (ref 0–99)
NonHDL: 102.86
Total CHOL/HDL Ratio: 3
Triglycerides: 114 mg/dL (ref 0.0–149.0)
VLDL: 22.8 mg/dL (ref 0.0–40.0)

## 2022-07-01 LAB — BASIC METABOLIC PANEL
BUN: 17 mg/dL (ref 6–23)
CO2: 31 mEq/L (ref 19–32)
Calcium: 9.3 mg/dL (ref 8.4–10.5)
Chloride: 100 mEq/L (ref 96–112)
Creatinine, Ser: 1.17 mg/dL (ref 0.40–1.50)
GFR: 62.6 mL/min (ref >60.00)
Glucose, Bld: 91 mg/dL (ref 70–99)
Potassium: 3.6 mEq/L (ref 3.5–5.1)
Sodium: 137 mEq/L (ref 135–145)

## 2022-07-01 LAB — HEPATIC FUNCTION PANEL
ALT: 28 U/L (ref 0–53)
AST: 27 U/L (ref 0–37)
Albumin: 4.1 g/dL (ref 3.5–5.2)
Alkaline Phosphatase: 86 U/L (ref 39–117)
Bilirubin, Direct: 0.2 mg/dL (ref 0.0–0.3)
Total Bilirubin: 1.3 mg/dL — ABNORMAL HIGH (ref 0.2–1.2)
Total Protein: 6.9 g/dL (ref 6.0–8.3)

## 2022-07-01 LAB — VITAMIN D 25 HYDROXY (VIT D DEFICIENCY, FRACTURES): VITD: 39.5 ng/mL (ref 30.00–100.00)

## 2022-07-01 LAB — HEMOGLOBIN A1C: Hgb A1c MFr Bld: 5.7 % (ref 4.6–6.5)

## 2022-07-01 NOTE — Patient Instructions (Addendum)
You had the flu shot today  Please continue all other medications as before, and refills have been done if requested.  Please have the pharmacy call with any other refills you may need.  Please continue your efforts at being more active, low cholesterol diet, and weight control.  You are otherwise up to date with prevention measures today.  Please keep your appointments with your specialists as you may have planned  You will be contacted regarding the referral for: Cardiac CT score  Please go to the LAB at the blood drawing area for the tests to be done  You will be contacted by phone if any changes need to be made immediately.  Otherwise, you will receive a letter about your results with an explanation, but please check with MyChart first.  Please remember to sign up for MyChart if you have not done so, as this will be important to you in the future with finding out test results, communicating by private email, and scheduling acute appointments online when needed.  Please make an Appointment to return in 6 months, or sooner if needed

## 2022-07-01 NOTE — Progress Notes (Unsigned)
Patient ID: Carl Trujillo, male   DOB: 03-May-1951, 71 y.o.   MRN: [id number]        Chief Complaint: follow up HTN, HLD and hyperglycemia low vit d       HPI:  Carl Trujillo is a 71 y.o. male here overall doing ok, Pt denies chest pain, increased sob or doe, wheezing, orthopnea, PND, increased LE swelling, palpitations, dizziness or syncope.   Pt denies polydipsia, polyuria, or new focal neuro s/s.    Pt denies fever, wt loss, night sweats, loss of appetite, or other constitutional symptoms   Due for flu shot.  Not taking Vit D Wt Readings from Last 3 Encounters:  07/01/22 164 lb (74.4 kg)  12/26/21 167 lb (75.8 kg)  10/01/21 160 lb (72.6 kg)   BP Readings from Last 3 Encounters:  07/01/22 124/70  12/26/21 126/62  10/01/21 138/70         Past Medical History:  Diagnosis Date   Anxiety    Arthritis of right knee    Arthritis of spine    ED (erectile dysfunction)    Hypertension    No past surgical history on file.  reports that he quit smoking about 24 years ago. His smoking use included cigars. He has never used smokeless tobacco. He reports current alcohol use of about 3.0 standard drinks of alcohol per week. He reports that he does not use drugs. family history includes CVA in his sister; Cancer in his mother and another family member; Coronary artery disease in an other family member; Post-traumatic stress disorder in his brother. No Known Allergies Current Outpatient Medications on File Prior to Visit  Medication Sig Dispense Refill   alprazolam (XANAX) 2 MG tablet TAKE 1/2 TO 1 (ONE-HALF TO ONE) TABLET BY MOUTH THREE TIMES DAILY AS NEEDED FOR ANXIETY AND FOR INSOMNIA 80 tablet 5   Ascorbic Acid (VITAMIN C) 1000 MG tablet Take 5,000 mg by mouth daily.      aspirin 81 MG tablet Take 81 mg by mouth daily.     Cascara Sagrada 450 MG CAPS Take by mouth.     cholecalciferol (VITAMIN D) 1000 units tablet Take 1,000 Units by mouth daily.     Cod Liver Oil 1000 MG CAPS Take by mouth.      cyclobenzaprine (FLEXERIL) 10 MG tablet Take 1 tablet (10 mg total) by mouth 2 (two) times daily as needed. 180 tablet 3   diclofenac (VOLTAREN) 75 MG EC tablet 1 tab by moth twice per day as needed 180 tablet 3   losartan-hydrochlorothiazide (HYZAAR) 100-25 MG tablet Take 1 tablet by mouth daily. 90 tablet 3   Magnesium-Zinc 133.33-5 MG TABS Take by mouth.     mupirocin nasal ointment (BACTROBAN) 2 % Place 1 application into the nose 2 (two) times daily. Use one-half of tube in each nostril twice daily for five (5) days. After application, press sides of nose together and gently massage. 10 g 0   Omega-3 Fatty Acids (OMEGA 3 PO) Take by mouth. Omega XL - 2 capsules daily     pyridoxine (B-6) 100 MG tablet Take 100 mg by mouth daily.     Specialty Vitamins Products (CENTRUM PERFORMANCE) TABS Take by mouth.     traZODone (DESYREL) 100 MG tablet Take 1 tablet (100 mg total) by mouth at bedtime. 90 tablet 1   vitamin B-12 (CYANOCOBALAMIN) 1000 MCG tablet Take 1,000 mcg by mouth daily.     No current facility-administered medications on file prior to visit.  ROS:  All others reviewed and negative.  Objective        PE:  BP 124/70 (BP Location: Right Arm, Patient Position: Sitting, Cuff Size: Large)   Pulse 62   Temp 97.8 F (36.6 C) (Oral)   Ht 5\' 11"  (1.803 m)   Wt 164 lb (74.4 kg)   SpO2 93%   BMI 22.87 kg/m                 Constitutional: Pt appears in NAD               HENT: Head: NCAT.                Right Ear: External ear normal.                 Left Ear: External ear normal.                Eyes: . Pupils are equal, round, and reactive to light. Conjunctivae and EOM are normal               Nose: without d/c or deformity               Neck: Neck supple. Gross normal ROM               Cardiovascular: Normal rate and regular rhythm.                 Pulmonary/Chest: Effort normal and breath sounds without rales or wheezing.                Abd:  Soft, NT, ND, + BS, no  organomegaly               Neurological: Pt is alert. At baseline orientation, motor grossly intact               Skin: Skin is warm. No rashes, no other new lesions, LE edema - none               Psychiatric: Pt behavior is normal without agitation   Micro: none  Cardiac tracings I have personally interpreted today:  none  Pertinent Radiological findings (summarize): none   Lab Results  Component Value Date   WBC 3.8 (L) 12/23/2021   HGB 13.7 12/23/2021   HCT 39.6 12/23/2021   PLT 269.0 12/23/2021   GLUCOSE 91 07/01/2022   CHOL 159 07/01/2022   TRIG 114.0 07/01/2022   HDL 55.90 07/01/2022   LDLCALC 80 07/01/2022   ALT 28 07/01/2022   AST 27 07/01/2022   NA 137 07/01/2022   K 3.6 07/01/2022   CL 100 07/01/2022   CREATININE 1.17 07/01/2022   BUN 17 07/01/2022   CO2 31 07/01/2022   TSH 3.24 12/23/2021   PSA 0.98 12/23/2021   HGBA1C 5.7 07/01/2022   Assessment/Plan:  Carl Trujillo is a 71 y.o. Black or African American [2] male with  has a past medical history of Anxiety, Arthritis of right knee, Arthritis of spine, ED (erectile dysfunction), and Hypertension.  Vitamin D deficiency Last vitamin D Lab Results  Component Value Date   VD25OH 29.50 (L) 12/23/2021   Low, reminded to start oral replacement   Essential hypertension BP Readings from Last 3 Encounters:  07/01/22 124/70  12/26/21 126/62  10/01/21 138/70   Stable, pt to continue medical treatment hyzaar 100 - 25 mg qd   Hyperglycemia Lab Results  Component Value Date   HGBA1C 5.7 07/01/2022   Stable,  pt to continue current medical treatment  - diet, wt control, excercise   Mixed hyperlipidemia Lab Results  Component Value Date   LDLCALC 80 07/01/2022   Stable, pt to continue current low chol diet, declines statin low dose for now; also for Cardiac CT score  Followup: No follow-ups on file.  Carl Cower, MD 07/02/2022 8:38 PM [facility]

## 2022-07-01 NOTE — Assessment & Plan Note (Signed)
Last vitamin D Lab Results  Component Value Date   VD25OH 29.50 (L) 12/23/2021   Low, reminded to start oral replacement

## 2022-07-02 NOTE — Assessment & Plan Note (Signed)
Lab Results  Component Value Date   HGBA1C 5.7 07/01/2022   Stable, pt to continue current medical treatment  - diet, wt control, excercise  

## 2022-07-02 NOTE — Assessment & Plan Note (Signed)
BP Readings from Last 3 Encounters:  07/01/22 124/70  12/26/21 126/62  10/01/21 138/70   Stable, pt to continue medical treatment hyzaar 100 - 25 mg qd

## 2022-07-02 NOTE — Assessment & Plan Note (Addendum)
Lab Results  Component Value Date   LDLCALC 80 07/01/2022   Stable, pt to continue current low chol diet, declines statin low dose for now; also for Cardiac CT score

## 2022-07-11 ENCOUNTER — Other Ambulatory Visit: Payer: Self-pay | Admitting: Internal Medicine

## 2022-07-11 ENCOUNTER — Ambulatory Visit (HOSPITAL_COMMUNITY)
Admission: RE | Admit: 2022-07-11 | Discharge: 2022-07-11 | Disposition: A | Discharge status: Home / Self Care | Payer: Medicare PPO | Source: Ambulatory Visit | Attending: Internal Medicine | Admitting: Internal Medicine

## 2022-07-11 ENCOUNTER — Encounter: Payer: Self-pay | Admitting: Internal Medicine

## 2022-07-11 DIAGNOSIS — E782 Mixed hyperlipidemia: Secondary | ICD-10-CM | POA: Insufficient documentation

## 2022-07-11 DIAGNOSIS — R931 Abnormal findings on diagnostic imaging of heart and coronary circulation: Secondary | ICD-10-CM

## 2022-07-11 DIAGNOSIS — R9431 Abnormal electrocardiogram [ECG] [EKG]: Secondary | ICD-10-CM | POA: Insufficient documentation

## 2022-07-11 DIAGNOSIS — I10 Essential (primary) hypertension: Secondary | ICD-10-CM | POA: Insufficient documentation

## 2022-07-11 DIAGNOSIS — R739 Hyperglycemia, unspecified: Secondary | ICD-10-CM | POA: Insufficient documentation

## 2022-07-11 MED ORDER — ROSUVASTATIN CALCIUM 10 MG PO TABS: 10.0000 mg | ORAL_TABLET | Freq: Every day | ORAL | 3 refills | Status: DC

## 2022-07-15 ENCOUNTER — Telehealth: Payer: Self-pay | Admitting: Internal Medicine

## 2022-07-15 NOTE — Telephone Encounter (Signed)
Patient was returning your call - told patient that you would call him back

## 2022-08-04 ENCOUNTER — Encounter: Payer: Self-pay | Admitting: Cardiology

## 2022-08-04 ENCOUNTER — Ambulatory Visit: Payer: Medicare PPO | Attending: Cardiology | Admitting: Cardiology

## 2022-08-04 VITALS — BP 142/72 | HR 75 | Ht 70.0 in | Wt 161.0 lb

## 2022-08-04 DIAGNOSIS — G4733 Obstructive sleep apnea (adult) (pediatric): Secondary | ICD-10-CM

## 2022-08-04 DIAGNOSIS — R739 Hyperglycemia, unspecified: Secondary | ICD-10-CM

## 2022-08-04 DIAGNOSIS — R931 Abnormal findings on diagnostic imaging of heart and coronary circulation: Secondary | ICD-10-CM

## 2022-08-04 DIAGNOSIS — Z87891 Personal history of nicotine dependence: Secondary | ICD-10-CM

## 2022-08-04 DIAGNOSIS — E782 Mixed hyperlipidemia: Secondary | ICD-10-CM | POA: Diagnosis not present

## 2022-08-04 DIAGNOSIS — I10 Essential (primary) hypertension: Secondary | ICD-10-CM | POA: Diagnosis not present

## 2022-08-04 HISTORY — DX: Abnormal findings on diagnostic imaging of heart and coronary circulation: R93.1

## 2022-08-04 NOTE — Patient Instructions (Addendum)
Medication Instructions:   Keep taking Rosuvastatin  daily      Lab Work:  Not needed     Testing/Procedures:  Not needed  Follow-Up: At [facility], you and your health needs are our priority.  As part of our continuing mission to provide you with exceptional heart care, we have created designated Provider Care Teams.  These Care Teams include your primary Cardiologist (physician) and Advanced Practice Providers (APPs -  Physician Assistants and Nurse Practitioners) who all work together to provide you with the care you need, when you need it.  We recommend signing up for the patient portal called "MyChart".  Sign up information is provided on this After Visit Summary.  MyChart is used to connect with patients for Virtual Visits (Telemedicine).  Patients are able to view lab/test results, encounter notes, upcoming appointments, etc.  Non-urgent messages can be sent to your provider as well.   To learn more about what you can do with MyChart, go to [email or web address].    Your next appointment:   6 month(s)  call in Jan 2024 for a April APPT  The format for your next appointment:   In Person  Provider:   Glenetta Hew, MD    Other Instructions  Your physician discussed the importance of regular exercise, diet, and recommended that you start or continue a regular exercise program for good health.

## 2022-08-04 NOTE — Progress Notes (Signed)
Primary Care Provider: Corwin Trujillo, Carl W, MD  HeartCare Cardiologist: Bryan Lemmaavid Taima Rada, MD Electrophysiologist: None  Clinic Note: Chief Complaint  Patient presents with   New Patient (Initial Visit)    Referred because of elevated Coronary Calcium Score   Coronary Artery Disease    Coronary Calcium Score over 500 mostly LAD.  Completely asymptomatic.    ===================================  ASSESSMENT/PLAN   Problem List Items Addressed This Visit       Cardiology Problems   Agatston coronary artery calcium score greater than 400 (Chronic)    Coronary Calcium Score 574 with the most with being in the LAD.  I explained to him the pathophysiology of coronary calcium and how this indicates presence of obstructive plaque.  We do not know his as is the plaque active or senescent..  His lipids are not overly elevated, his BP is usually pretty well controlled (was 120/70 at his PCPs office.  He is pretty anxious today (recheck blood pressure was 136/70)  I explained to him that in the absence of any active cardiac symptoms, the we will have any indication for doing a stress test or Coronary CTA, but low threshold if he were to have symptoms of exertional dyspnea or chest pain pressure etc.  We talked about this fact medication with continue his statin.  Continuing his ARB and/HCTZ.  He is on aspirin which is also indicated for elevated calcium score.  Not currently on beta-blocker which would be reasonable to hold off on blood pressures increase.  LDL goal should be less than 70 and he is already at 80 having just started statin.  Plan: Continue statin, aspirin and ARB-HCTZ. Continue to try to increase activity level as this will tell us if he is actually having symptoms.      Essential hypertension - Primary (Chronic)    BP was little high on initial evaluation here with back into the normal range on follow-up check.  He is pretty stressed.  It was also checked shortly after he  sat down.  I suspect that the pressures in his PCPs office of are probably more accurate.  I would just simply continue his current medications.  If pressures do increase, we can consider beta-blocker for cardioprotection.       Relevant Orders   EKG 12-Lead (Completed)   Mixed hyperlipidemia (Chronic)    With Coronary Calcium Score at 574, target LDL should be less than 70.  Was just started on statin.  Lipids were actually not that bad with an LDL of 80 last month.  Hopefully with the initiation of statin, lipids will improve.  This will help stabilize plaque.  Long discussion about dietary habits.        Other   Obstructive sleep apnea    Has not really been followed up with pulmonary medicine.  I do not think he is using CPAP now.      History of tobacco use (Chronic)    No longer smoking.      Hyperglycemia (Chronic)    Borderline glucose tolerance.  Also another risk factor for CAD.  Monitor closely and initiate treatment when warranted.  Defer to PCP.      ===================================  HPI:    Carl Trujillo is a 71 y.o. male with a PMH notable for HTN,, HLD ED who used to smoke cigars) is being seen today for the evaluation of elevated Coronary Calcium Score at the request of Carl Trujillo, Carl W, MD.  Carl Trujillo was seen  on 07/01/2022 by Dr. Jenny Reichmann -> Coronary Calcium Score ordered for risk stratification.  He was pretty much asymptomatic, but with evidence of HLD, restratification was warranted.  Recent Hospitalizations: None  Reviewed  CV studies:    The following studies were reviewed today: (if available, images/films reviewed: From Epic Chart or Care Everywhere) Coronary Calcium Score 07/11/2022: Total Calcium Score 574, LAD 493, LCx 25, RCA 56.  2 cm right adrenal mass likely benign adenoma  Interval History:   Carl Trujillo presents for cardiology evaluation.  He is pretty much asymptomatic.   He said that he probably is not as active as he used to be.  He he  has done a lot of walking years-his friend used to walk with him diet and he just got out of the habit.  But whenever he does activity he denies any chest pain or pressure with rest or exertion.  He is very concerned about the whole cholesterol thing in the LAD coronary calcification.  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  He eats a egg sandwich and when he realized that the [place] had salt he took a short [place] sandwich.  He spent a lot of time trying to explain why he should not have high cholesterol levels or other risk factors.  He has no issues with raking his leaves or walking around in the neighborhood, going shopping etc.  No active cardiac symptoms noted in CV ROS below.  CV Review of Symptoms (Summary): Cardiovascular ROS: no chest pain or dyspnea on exertion negative for - edema, irregular heartbeat, orthopnea, palpitations, paroxysmal nocturnal dyspnea, rapid heart rate, shortness of breath, or syncope/near syncope, TIA/amaurosis fugax, claudication  He said that he used to be a taking red yeast rice back in 2012 but stopped it just because he did not know why he was taking it.  Only family history of any Heart diseases that he had a brother who been via not better and long-term smoker with issues from being a veteran.  He recently had a cardiac cath with a stent placed.  He tells me that he does not have a lot of, with his brother-not a heavy smoker like his brother nor alcohol and other risk factors.  REVIEWED OF SYSTEMS   Review of Systems  Constitutional:  Negative for malaise/fatigue and weight loss.  HENT:         Poor teeth  Respiratory:  Negative for cough and shortness of breath.   Gastrointestinal:  Negative for abdominal pain, blood in stool, constipation and melena.  Genitourinary:  Negative for hematuria.  Musculoskeletal:  Negative for joint pain.  Neurological:  Negative for dizziness and focal weakness.  Psychiatric/Behavioral:  Negative for  memory loss. The patient does not have insomnia.    I have reviewed and (if needed) personally updated the patient's problem list, medications, allergies, past medical and surgical history, social and family history.   PAST MEDICAL HISTORY   Past Medical History:  Diagnosis Date   Anxiety    Arthritis of right knee    Arthritis of spine    ED (erectile dysfunction)    Hyperlipidemia 05/2022   Recently diagnosed: Added on rosuvastatin and omega-3 fatty acids   Hypertension     PAST SURGICAL HISTORY   History reviewed. No pertinent surgical history.  Immunization History  Administered Date(s) Administered   Fluad Quad(high Dose 65+) 06/29/2019, 09/27/2020, 07/01/2022   Influenza Split 06/27/2013, 08/22/2016, 07/13/2017   Influenza Whole 07/13/2008,  07/11/2009   Influenza, High Dose Seasonal PF 06/24/2018   Influenza-Unspecified 07/18/2014, 07/01/2021   PFIZER(Purple Top)SARS-COV-2 Vaccination 12/09/2019, 01/03/2020, 07/25/2020, 03/19/2021   Pneumococcal Polysaccharide-23 08/22/2016   Td 07/13/2008   Zoster Recombinat (Shingrix) 06/17/2021, 08/19/2021    MEDICATIONS/ALLERGIES   Current Meds  Medication Sig   alprazolam (XANAX) 2 MG tablet TAKE 1/2 TO 1 (ONE-HALF TO ONE) TABLET BY MOUTH THREE TIMES DAILY AS NEEDED FOR ANXIETY AND FOR INSOMNIA   Ascorbic Acid (VITAMIN C) 1000 MG tablet Take 5,000 mg by mouth daily.    aspirin 81 MG tablet Take 81 mg by mouth daily.   Cascara Sagrada 450 MG CAPS Take by mouth.   cholecalciferol (VITAMIN D) 1000 units tablet Take 1,000 Units by mouth daily.   cyclobenzaprine (FLEXERIL) 10 MG tablet Take 1 tablet (10 mg total) by mouth 2 (two) times daily as needed.   losartan-hydrochlorothiazide (HYZAAR) 100-25 MG tablet Take 1 tablet by mouth daily.   Magnesium-Zinc 133.33-5 MG TABS Take by mouth.   mupirocin nasal ointment (BACTROBAN) 2 % Place 1 application into the nose 2 (two) times daily. Use one-half of tube in each nostril twice daily  for five (5) days. After application, press sides of nose together and gently massage.   Omega-3 Fatty Acids (OMEGA 3 PO) Take by mouth. Omega XL - 2 capsules daily   pyridoxine (B-6) 100 MG tablet Take 100 mg by mouth daily.   rosuvastatin (CRESTOR) 10 MG tablet Take 1 tablet (10 mg total) by mouth daily.   Specialty Vitamins Products (CENTRUM PERFORMANCE) TABS Take by mouth.   traZODone (DESYREL) 100 MG tablet Take 1 tablet (100 mg total) by mouth at bedtime.   vitamin B-12 (CYANOCOBALAMIN) 1000 MCG tablet Take 1,000 mcg by mouth daily.    No Known Allergies  SOCIAL HISTORY/FAMILY HISTORY   Reviewed in Epic:   Social History   Tobacco Use   Smoking status: Former    Types: Cigars    Quit date: 10/13/1997    Years since quitting: 24.8   Smokeless tobacco: Never   Tobacco comments:    current smoker of cigars in the pm - stopped cigarettes 36yrs ago  Vaping Use   Vaping Use: Never used  Substance Use Topics   Alcohol use: Yes    Alcohol/week: 3.0 standard drinks of alcohol    Types: 3 Glasses of wine per week   Drug use: No   Social History   Social History Narrative   Escher says he has been taking care of himself since he was age 51.  His parents were essentially nonexistent.  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   (He tells me that he worked at the [employer] ")    He has not been working since early 60s.  He just "takes care of himself.  He lives alone after his long-term friend passed.  He used to walk quite a bit but then stopped doing that once his friend was no longer available.   Family History  Problem Relation Age of Onset   Cancer Other    Coronary artery disease Other    CVA Sister    Cancer Mother    Post-traumatic stress disorder Brother     OBJCTIVE -PE, EKG, labs   Wt Readings from Last 3 Encounters:  08/04/22 161 lb (73 kg)  07/01/22 164 lb (74.4 kg)  12/26/21 167 lb (75.8 kg)  Physical Exam: BP (!) 142/72   Pulse 75   Ht 5\' 10"  (1.778 m)   Wt 161 lb (73 kg)   SpO2 98%   BMI 23.10 kg/m recheck BP was 136/70 mmHg. Physical Exam Vitals reviewed.  Constitutional:      Appearance: Normal appearance.  HENT:     Head: Normocephalic and atraumatic.     Mouth/Throat:     Comments: Missing several teeth-poor dentition Eyes:     Extraocular Movements: Extraocular movements intact.  Neck:     Vascular: No carotid bruit.  Cardiovascular:     Rate and Rhythm: Normal rate and regular rhythm.     Pulses: Normal pulses.     Heart sounds: Normal heart sounds, S1 normal and S2 normal. No murmur heard.    No friction rub. No gallop.  Pulmonary:     Effort: Pulmonary effort is normal. No respiratory distress.     Breath sounds: Normal breath sounds. No wheezing, rhonchi or rales.  Chest:     Chest wall: No tenderness.  Abdominal:     General: Abdomen is flat. Bowel sounds are normal. There is no distension.     Palpations: Abdomen is soft.     Tenderness: There is no abdominal tenderness. There is no right CVA tenderness, guarding or rebound.  Musculoskeletal:     Cervical back: Normal range of motion and neck supple.  Skin:    General: Skin is warm and dry.  Neurological:     General: No focal deficit present.     Mental Status: He is alert and oriented to person, place, and time.     Gait: Gait normal.  Psychiatric:        Mood and Affect: Mood normal.        Behavior: Behavior normal.        Thought Content: Thought content normal.        Judgment: Judgment normal.      Adult ECG Report  Rate: 75;  Rhythm: normal sinus rhythm and nonspecific IVCD.  T wave abnormality consider inferior ischemia. ;   Narrative Interpretation: No prior EKG  Recent Labs: Reviewed-PCP started low-dose Crestor based on these labs. Lab Results  Component Value Date   CHOL 159 07/01/2022   HDL 55.90 07/01/2022   LDLCALC 80 07/01/2022   TRIG 114.0 07/01/2022    CHOLHDL 3 07/01/2022   Lab Results  Component Value Date   CREATININE 1.17 07/01/2022   BUN 17 07/01/2022   NA 137 07/01/2022   K 3.6 07/01/2022   CL 100 07/01/2022   CO2 31 07/01/2022      Latest Ref Rng & Units 12/23/2021   12:42 PM 12/06/2020   12:50 PM 12/22/2019    8:53 AM  CBC  WBC 4.0 - 10.5 K/uL 3.8  4.4  4.5   Hemoglobin 13.0 - 17.0 g/dL 02/21/2020  08.6  57.8   Hematocrit 39.0 - 52.0 % 39.6  43.0  44.6   Platelets 150.0 - 400.0 K/uL 269.0  266.0  232.0     Lab Results  Component Value Date   HGBA1C 5.7 07/01/2022   Lab Results  Component Value Date   TSH 3.24 12/23/2021    ================================================== I spent a total of 36 minutes with the patient spent in direct patient consultation.  Additional time spent with chart review  / charting (studies, outside notes, etc): 26 min Total Time: 62 min  Current medicines are reviewed at length with the patient today.  (+/-  concerns) none  Notice: This dictation was prepared with Dragon dictation along with smart phrase technology. Any transcriptional errors that result from this process are unintentional and may not be corrected upon review.   Studies Ordered:  Orders Placed This Encounter  Procedures   EKG 12-Lead   No orders of the defined types were placed in this encounter.   Patient Instructions / Medication Changes & Studies & Tests Ordered   Patient Instructions  Medication Instructions:   Keep taking Rosuvastatin  daily      Lab Work:  Not needed     Testing/Procedures:  Not needed  Follow-Up: At [facility], you and your health needs are our priority.  As part of our continuing mission to provide you with exceptional heart care, we have created designated Provider Care Teams.  These Care Teams include your primary Cardiologist (physician) and Advanced Practice Providers (APPs -  Physician Assistants and Nurse Practitioners) who all work together to provide you with the  care you need, when you need it.  We recommend signing up for the patient portal called "MyChart".  Sign up information is provided on this After Visit Summary.  MyChart is used to connect with patients for Virtual Visits (Telemedicine).  Patients are able to view lab/test results, encounter notes, upcoming appointments, etc.  Non-urgent messages can be sent to your provider as well.   To learn more about what you can do with MyChart, go to [email or web address].    Your next appointment:   6 month(s)  call in Jan 2024 for a April APPT  The format for your next appointment:   In Person  Provider:   Bryan Lemma, MD    Other Instructions  Your physician discussed the importance of regular exercise, diet, and recommended that you start or continue a regular exercise program for good health.     Marykay Lex, MD, MS Bryan Lemma, M.D., M.S. Interventional Cardiologist  [facility]  Pager # [phone] Phone # [phone] [address] [place], [place] [address]   Thank you for choosing [facility] [place]!!

## 2022-08-09 ENCOUNTER — Encounter: Payer: Self-pay | Admitting: Cardiology

## 2022-08-10 ENCOUNTER — Encounter: Payer: Self-pay | Admitting: Cardiology

## 2022-08-10 NOTE — Assessment & Plan Note (Signed)
Has not really been followed up with pulmonary medicine.  I do not think he is using CPAP now.

## 2022-08-10 NOTE — Assessment & Plan Note (Signed)
No longer smoking 

## 2022-08-10 NOTE — Progress Notes (Incomplete)
Primary Care Provider: Biagio Borg, MD [place] Cardiologist: Glenetta Hew, MD Electrophysiologist: None  Clinic Note: No chief complaint on file.   ===================================  ASSESSMENT/PLAN   Problem List Items Addressed This Visit       Cardiology Problems   Agatston coronary artery calcium score greater than 400 (Chronic)   Essential hypertension - Primary (Chronic)   Relevant Orders   EKG 12-Lead (Completed)   Mixed hyperlipidemia (Chronic)     Other   History of tobacco use (Chronic)   Hyperglycemia (Chronic)   ===================================  HPI:    Carl Trujillo is a 71 y.o. male with a PMH notable for HTN,, HLD ED who used to smoke cigars) is being seen today for the evaluation of elevated Coronary Calcium Score at the request of Biagio Borg, MD.  Carl Trujillo was seen on 07/01/2022 by Dr. Jenny Reichmann -> Coronary Calcium Score ordered for risk stratification.  He was pretty much asymptomatic, but with evidence of HLD, restratification was warranted.  Recent Hospitalizations: None  Reviewed  CV studies:    The following studies were reviewed today: (if available, images/films reviewed: From Epic Chart or Care Everywhere) Coronary Calcium Score 07/11/2022: Total Calcium Score 574, LAD 493, LCx 25, RCA 56.  2 cm right adrenal mass likely benign adenoma  Interval History:   Carl Trujillo presents for cardiology evaluation.  He is pretty much asymptomatic.   He said that he probably is not as active as he used to be.  He he has done a lot of walking years-his friend used to walk with him diet and he just got out of the habit.  But whenever he does activity he denies any chest pain or pressure with rest or exertion.  He is very concerned about the whole cholesterol thing in the LAD coronary calcification.  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  He eats a egg sandwich and when he realized that the [place] had salt he took  a short [place] sandwich.  He spent a lot of time trying to explain why he should not have high cholesterol levels or other risk factors.  He has no issues with raking his leaves or walking around in the neighborhood, going shopping etc.  No active cardiac symptoms noted in CV ROS below.  CV Review of Symptoms (Summary): Cardiovascular ROS: no chest pain or dyspnea on exertion negative for - edema, irregular heartbeat, orthopnea, palpitations, paroxysmal nocturnal dyspnea, rapid heart rate, shortness of breath, or syncope/near syncope, TIA/amaurosis fugax, claudication  He said that he used to be a taking red yeast rice back in 2012 but stopped it just because he did not know why he was taking it.  Only family history of any Heart diseases that he had a brother who been via not better and long-term smoker with issues from being a veteran.  He recently had a cardiac cath with a stent placed.  He tells me that he does not have a lot of, with his brother-not a heavy smoker like his brother nor alcohol and other risk factors.  REVIEWED OF SYSTEMS   Review of Systems  Constitutional:  Negative for malaise/fatigue and weight loss.  HENT:         Poor teeth  Respiratory:  Negative for cough and shortness of breath.   Gastrointestinal:  Negative for abdominal pain, blood in stool, constipation and melena.  Genitourinary:  Negative for hematuria.  Musculoskeletal:  Negative for  joint pain.  Neurological:  Negative for dizziness and focal weakness.  Psychiatric/Behavioral:  Negative for memory loss. The patient does not have insomnia.    I have reviewed and (if needed) personally updated the patient's problem list, medications, allergies, past medical and surgical history, social and family history.   PAST MEDICAL HISTORY   Past Medical History:  Diagnosis Date  . Anxiety   . Arthritis of right knee   . Arthritis of spine   . ED (erectile dysfunction)   . Hyperlipidemia 05/2022   Recently  diagnosed: Added on rosuvastatin and omega-3 fatty acids  . Hypertension     PAST SURGICAL HISTORY   No past surgical history on file.  Immunization History  Administered Date(s) Administered  . Fluad Quad(high Dose 65+) 06/29/2019, 09/27/2020, 07/01/2022  . Influenza Split 06/27/2013, 08/22/2016, 07/13/2017  . Influenza Whole 07/13/2008, 07/11/2009  . Influenza, High Dose Seasonal PF 06/24/2018  . Influenza-Unspecified 07/18/2014, 07/01/2021  . PFIZER(Purple Top)SARS-COV-2 Vaccination 12/09/2019, 01/03/2020, 07/25/2020, 03/19/2021  . Pneumococcal Polysaccharide-23 08/22/2016  . Td 07/13/2008  . Zoster Recombinat (Shingrix) 06/17/2021, 08/19/2021    MEDICATIONS/ALLERGIES   Current Meds  Medication Sig  . alprazolam (XANAX) 2 MG tablet TAKE 1/2 TO 1 (ONE-HALF TO ONE) TABLET BY MOUTH THREE TIMES DAILY AS NEEDED FOR ANXIETY AND FOR INSOMNIA  . Ascorbic Acid (VITAMIN C) 1000 MG tablet Take 5,000 mg by mouth daily.   [name] aspirin 81 MG tablet Take 81 mg by mouth daily.  [name] 450 MG CAPS Take by mouth.  . cholecalciferol (VITAMIN D) 1000 units tablet Take 1,000 Units by mouth daily.  . cyclobenzaprine (FLEXERIL) 10 MG tablet Take 1 tablet (10 mg total) by mouth 2 (two) times daily as needed.  [name] losartan-hydrochlorothiazide (HYZAAR) 100-25 MG tablet Take 1 tablet by mouth daily.  . Magnesium-Zinc 133.33-5 MG TABS Take by mouth.  . mupirocin nasal ointment (BACTROBAN) 2 % Place 1 application into the nose 2 (two) times daily. Use one-half of tube in each nostril twice daily for five (5) days. After application, press sides of nose together and gently massage.  . Omega-3 Fatty Acids (OMEGA 3 PO) Take by mouth. Omega XL - 2 capsules daily  . pyridoxine (B-6) 100 MG tablet Take 100 mg by mouth daily.  . rosuvastatin (CRESTOR) 10 MG tablet Take 1 tablet (10 mg total) by mouth daily.  [name] Specialty Vitamins Products (CENTRUM PERFORMANCE) TABS Take by mouth.  . traZODone (DESYREL) 100  MG tablet Take 1 tablet (100 mg total) by mouth at bedtime.  . vitamin B-12 (CYANOCOBALAMIN) 1000 MCG tablet Take 1,000 mcg by mouth daily.    No Known Allergies  SOCIAL HISTORY/FAMILY HISTORY   Reviewed in Epic:   Social History   Tobacco Use  . Smoking status: Former    Types: Cigars    Quit date: 10/13/1997    Years since quitting: 24.8  . Smokeless tobacco: Never  . Tobacco comments:    current smoker of cigars in the pm - stopped cigarettes 23yrs ago  Vaping Use  . Vaping Use: Never used  Substance Use Topics  . Alcohol use: Yes    Alcohol/week: 3.0 standard drinks of alcohol    Types: 3 Glasses of wine per week  . Drug use: No   Social History   Social History Narrative   Carl Trujillo says he has been taking care of himself since he was age 3.  His parents were essentially nonexistent.  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   (He tells me that he worked at the [employer] ")    He has not been working since early 60s.  He just "takes care of himself.  He lives alone after his long-term friend passed.  He used to walk quite a bit but then stopped doing that once his friend was no longer available.   Family History  Problem Relation Age of Onset  . Cancer Other   . Coronary artery disease Other   . CVA Sister   . Cancer Mother   . Post-traumatic stress disorder Brother     OBJCTIVE -PE, EKG, labs   Wt Readings from Last 3 Encounters:  08/04/22 161 lb (73 kg)  07/01/22 164 lb (74.4 kg)  12/26/21 167 lb (75.8 kg)    Physical Exam: BP (!) 142/72   Pulse 75   Ht 5\' 10"  (1.778 m)   Wt 161 lb (73 kg)   SpO2 98%   BMI 23.10 kg/m  Physical Exam Vitals reviewed.  Constitutional:      Appearance: Normal appearance.  HENT:     Head: Normocephalic and atraumatic.     Mouth/Throat:     Comments: Missing several teeth-poor dentition Eyes:     Extraocular Movements: Extraocular movements intact.   Neck:     Vascular: No carotid bruit.  Cardiovascular:     Rate and Rhythm: Normal rate and regular rhythm.     Pulses: Normal pulses.     Heart sounds: Normal heart sounds, S1 normal and S2 normal. No murmur heard.    No friction rub. No gallop.  Pulmonary:     Effort: Pulmonary effort is normal. No respiratory distress.     Breath sounds: Normal breath sounds. No wheezing, rhonchi or rales.  Chest:     Chest wall: No tenderness.  Abdominal:     General: Abdomen is flat. Bowel sounds are normal. There is no distension.     Palpations: Abdomen is soft.     Tenderness: There is no abdominal tenderness. There is no right CVA tenderness, guarding or rebound.  Musculoskeletal:     Cervical back: Normal range of motion and neck supple.  Skin:    General: Skin is warm and dry.  Neurological:     General: No focal deficit present.     Mental Status: He is alert and oriented to person, place, and time.     Gait: Gait normal.  Psychiatric:        Mood and Affect: Mood normal.        Behavior: Behavior normal.        Thought Content: Thought content normal.        Judgment: Judgment normal.      Adult ECG Report  Rate: 75;  Rhythm: normal sinus rhythm and nonspecific IVCD.  T wave abnormality consider inferior ischemia. ;   Narrative Interpretation: No prior EKG  Recent Labs: Reviewed-PCP started low-dose Crestor based on these labs. Lab Results  Component Value Date   CHOL 159 07/01/2022   HDL 55.90 07/01/2022   LDLCALC 80 07/01/2022   TRIG 114.0 07/01/2022   CHOLHDL 3 07/01/2022   Lab Results  Component Value Date   CREATININE 1.17 07/01/2022   BUN 17 07/01/2022   NA 137 07/01/2022   K 3.6 07/01/2022   CL 100 07/01/2022   CO2 31 07/01/2022      Latest Ref Rng & Units 12/23/2021  12:42 PM 12/06/2020   12:50 PM 12/22/2019    8:53 AM  CBC  WBC 4.0 - 10.5 K/uL 3.8  4.4  4.5   Hemoglobin 13.0 - 17.0 g/dL 83.3  82.5  05.3   Hematocrit 39.0 - 52.0 % 39.6  43.0  44.6    Platelets 150.0 - 400.0 K/uL 269.0  266.0  232.0     Lab Results  Component Value Date   HGBA1C 5.7 07/01/2022   Lab Results  Component Value Date   TSH 3.24 12/23/2021    ================================================== I spent a total of 36 minutes with the patient spent in direct patient consultation.  Additional time spent with chart review  / charting (studies, outside notes, etc): 26 min Total Time: 62 min  Current medicines are reviewed at length with the patient today.  (+/- concerns) none  Notice: This dictation was prepared with Dragon dictation along with smart phrase technology. Any transcriptional errors that result from this process are unintentional and may not be corrected upon review.   Studies Ordered:  Orders Placed This Encounter  Procedures  . EKG 12-Lead   No orders of the defined types were placed in this encounter.   Patient Instructions / Medication Changes & Studies & Tests Ordered   Patient Instructions  Medication Instructions:   Keep taking Rosuvastatin  daily      Lab Work:  Not needed     Testing/Procedures:  Not needed  Follow-Up: At [facility], you and your health needs are our priority.  As part of our continuing mission to provide you with exceptional heart care, we have created designated Provider Care Teams.  These Care Teams include your primary Cardiologist (physician) and Advanced Practice Providers (APPs -  Physician Assistants and Nurse Practitioners) who all work together to provide you with the care you need, when you need it.  We recommend signing up for the patient portal called "MyChart".  Sign up information is provided on this After Visit Summary.  MyChart is used to connect with patients for Virtual Visits (Telemedicine).  Patients are able to view lab/test results, encounter notes, upcoming appointments, etc.  Non-urgent messages can be sent to your provider as well.   To learn more about what you can do  with MyChart, go to [email or web address].    Your next appointment:   6 month(s)  call in Jan 2024 for a April APPT  The format for your next appointment:   In Person  Provider:   Bryan Lemma, MD    Other Instructions  Your physician discussed the importance of regular exercise, diet, and recommended that you start or continue a regular exercise program for good health.     Carl Lex, MD, MS Bryan Lemma, M.D., M.S. Interventional Cardiologist  [facility]  Pager # [phone] Phone # [phone] [address] [place], [place] [address]   Thank you for choosing [facility] [place]!!

## 2022-08-10 NOTE — Assessment & Plan Note (Signed)
BP was little high on initial evaluation here with back into the normal range on follow-up check.  He is pretty stressed.  It was also checked shortly after he sat down.  I suspect that the pressures in his PCPs office of are probably more accurate.  I would just simply continue his current medications.  If pressures do increase, we can consider beta-blocker for cardioprotection.

## 2022-08-10 NOTE — Assessment & Plan Note (Signed)
Coronary Calcium Score 574 with the most with being in the LAD.  I explained to him the pathophysiology of coronary calcium and how this indicates presence of obstructive plaque.  We do not know his as is the plaque active or senescent..  His lipids are not overly elevated, his BP is usually pretty well controlled (was 120/70 at his PCPs office.  He is pretty anxious today (recheck blood pressure was 136/70)  I explained to him that in the absence of any active cardiac symptoms, the we will have any indication for doing a stress test or Coronary CTA, but low threshold if he were to have symptoms of exertional dyspnea or chest pain pressure etc.  We talked about this fact medication with continue his statin.  Continuing his ARB and/HCTZ.  He is on aspirin which is also indicated for elevated calcium score.  Not currently on beta-blocker which would be reasonable to hold off on blood pressures increase.  LDL goal should be less than 70 and he is already at 70 having just started statin.  Plan: Continue statin, aspirin and ARB-HCTZ. Continue to try to increase activity level as this will tell us if he is actually having symptoms.

## 2022-08-10 NOTE — Assessment & Plan Note (Signed)
Borderline glucose tolerance.  Also another risk factor for CAD.  Monitor closely and initiate treatment when warranted.  Defer to PCP.

## 2022-08-10 NOTE — Assessment & Plan Note (Addendum)
With Coronary Calcium Score at 574, target LDL should be less than 70.  Was just started on statin.  Lipids were actually not that bad with an LDL of 80 last month.  Hopefully with the initiation of statin, lipids will improve.  This will help stabilize plaque.  Long discussion about dietary habits.

## 2022-08-15 ENCOUNTER — Ambulatory Visit: Payer: Medicare PPO | Admitting: Psychiatry

## 2022-08-15 ENCOUNTER — Other Ambulatory Visit: Payer: Self-pay | Admitting: Psychiatry

## 2022-08-15 DIAGNOSIS — F411 Generalized anxiety disorder: Secondary | ICD-10-CM

## 2022-08-15 NOTE — Telephone Encounter (Signed)
Filled 10/2

## 2022-08-15 NOTE — Telephone Encounter (Signed)
We had to cancel his appt this morning due to the power outage.  He is now Carl Trujillo for 11/8.  Send prescription to [facility]

## 2022-08-20 ENCOUNTER — Ambulatory Visit (INDEPENDENT_AMBULATORY_CARE_PROVIDER_SITE_OTHER): Payer: Medicare PPO | Admitting: Psychiatry

## 2022-08-20 ENCOUNTER — Encounter: Payer: Self-pay | Admitting: Psychiatry

## 2022-08-20 DIAGNOSIS — F411 Generalized anxiety disorder: Secondary | ICD-10-CM | POA: Diagnosis not present

## 2022-08-20 MED ORDER — ALPRAZOLAM 2 MG PO TABS: ORAL_TABLET | ORAL | 5 refills | Status: DC

## 2022-08-20 NOTE — Progress Notes (Signed)
Arin Peral [id number] [birth year] 71 y.o.  Subjective:   Patient ID:  Carl Trujillo is a 71 y.o. (DOB 10/14/1950) male.  Chief Complaint:  Chief Complaint  Patient presents with   Follow-up    Anxiety    HPI Carl Trujillo presents to the office today for follow-up of anxiety.  Patient reports that he continues to do well overall.  He reports that he was referred to cardiology by his PCP after he shared that his oldest brother had a cardiac issue.  He reports that this was surprising since he typically does not have any health issues.  He reports that he has never had surgery or any serious health issues in the past.  He reports that cardiologist recommended that he start walking daily and he has been walking 2 miles daily since that time and "I feel 200% better."  He denies any significant anxiety.  Denies depressed mood.  He reports adequate sleep and reports that he has changed his sleep schedule slightly so that he can wake up at 10 AM to walk.  He reports that he continues to eat as healthy as possible.  He reports that his energy and motivation have improved.  No suicidal ideation.   AUDIT    Flowsheet Row Clinical Support from 05/09/2022 in [facility]  Alcohol Use Disorder Identification Test Final Score (AUDIT) 3      Mini-Mental    Flowsheet Row Clinical Support from 03/17/2018 in [facility]  Total Score (max 30 points ) 29      PHQ2-9    Flowsheet Row Office Visit from 07/01/2022 in [facility] Clinical Support from 05/09/2022 in [facility] Visit from 12/26/2021 in [facility] Visit from 12/06/2020 in [facility] Clinical Support from 03/28/2020 in [facility] at [facility]  PHQ-2 Total Score 0 0 0 0 0  PHQ-9 Total Score 0 -- -- -- --      Flowsheet Row ED from 07/29/2021 in [facility]  ED from 07/11/2021 in [facility] EMERGENCY DEPARTMENT  C-SSRS RISK CATEGORY Error: Question 6 not populated No Risk        Review of Systems:  Review of Systems  Constitutional:  Negative for fatigue.  Cardiovascular:  Negative for chest pain.  Musculoskeletal:  Negative for gait problem.  Psychiatric/Behavioral:         Please refer to HPI    Medications: I have reviewed the patient's current medications.  Current Outpatient Medications  Medication Sig Dispense Refill   [START ON 09/17/2022] alprazolam (XANAX) 2 MG tablet TAKE 1/2 TO 1 (ONE-HALF TO ONE) TABLET BY MOUTH THREE TIMES DAILY AS NEEDED FOR ANXIETY AND  FOR  INSOMNIA 80 tablet 5   Ascorbic Acid (VITAMIN C) 1000 MG tablet Take 5,000 mg by mouth daily.      aspirin 81 MG tablet Take 81 mg by mouth daily.     Cascara Sagrada 450 MG CAPS Take by mouth.     cholecalciferol (VITAMIN D) 1000 units tablet Take 1,000 Units by mouth daily.     Cod Liver Oil 1000 MG CAPS Take by mouth. (Patient not taking: Reported on 08/04/2022)     cyclobenzaprine (FLEXERIL) 10 MG tablet Take 1 tablet (10 mg total) by mouth 2 (two) times daily as needed. 180 tablet 3   losartan-hydrochlorothiazide (HYZAAR) 100-25 MG tablet Take 1 tablet  by mouth daily. 90 tablet 3   Magnesium-Zinc 133.33-5 MG TABS Take by mouth.     mupirocin nasal ointment (BACTROBAN) 2 % Place 1 application into the nose 2 (two) times daily. Use one-half of tube in each nostril twice daily for five (5) days. After application, press sides of nose together and gently massage. 10 g 0   Omega-3 Fatty Acids (OMEGA 3 PO) Take by mouth. Omega XL - 2 capsules daily     pyridoxine (B-6) 100 MG tablet Take 100 mg by mouth daily.     rosuvastatin (CRESTOR) 10 MG tablet Take 1 tablet (10 mg total) by mouth daily. 90 tablet 3   Specialty Vitamins Products (CENTRUM PERFORMANCE) TABS Take by mouth.     traZODone (DESYREL) 100 MG tablet Take 1 tablet (100 mg total) by mouth at  bedtime. 90 tablet 1   vitamin B-12 (CYANOCOBALAMIN) 1000 MCG tablet Take 1,000 mcg by mouth daily.     No current facility-administered medications for this visit.    Medication Side Effects: None  Allergies: No Known Allergies  Past Medical History:  Diagnosis Date   Anxiety    Arthritis of right knee    Arthritis of spine    ED (erectile dysfunction)    Hyperlipidemia 05/2022   Recently diagnosed: Added on rosuvastatin and omega-3 fatty acids   Hypertension     Past Medical History, Surgical history, Social history, and Family history were reviewed and updated as appropriate.   Please see review of systems for further details on the patient's review from today.   Objective:   Physical Exam:  There were no vitals taken for this visit.  Physical Exam Constitutional:      General: He is not in acute distress. Musculoskeletal:        General: No deformity.  Neurological:     Mental Status: He is alert and oriented to person, place, and time.     Coordination: Coordination normal.  Psychiatric:        Attention and Perception: Attention and perception normal. He does not perceive auditory or visual hallucinations.        Mood and Affect: Mood normal. Mood is not anxious or depressed. Affect is not labile, blunt, angry or inappropriate.        Speech: Speech normal.        Behavior: Behavior normal.        Thought Content: Thought content normal. Thought content is not paranoid or delusional. Thought content does not include homicidal or suicidal ideation. Thought content does not include homicidal or suicidal plan.        Cognition and Memory: Cognition and memory normal.        Judgment: Judgment normal.     Comments: Insight intact     Lab Review:     Component Value Date/Time   NA 137 07/01/2022 1120   K 3.6 07/01/2022 1120   CL 100 07/01/2022 1120   CO2 31 07/01/2022 1120   GLUCOSE 91 07/01/2022 1120   BUN 17 07/01/2022 1120   CREATININE 1.17 07/01/2022  1120   CALCIUM 9.3 07/01/2022 1120   PROT 6.9 07/01/2022 1120   ALBUMIN 4.1 07/01/2022 1120   AST 27 07/01/2022 1120   ALT 28 07/01/2022 1120   ALKPHOS 86 07/01/2022 1120   BILITOT 1.3 (H) 07/01/2022 1120   GFRNONAA 88.07 11/14/2009 1036       Component Value Date/Time   WBC 3.8 (L) 12/23/2021 1242   RBC 4.31  12/23/2021 1242   HGB 13.7 12/23/2021 1242   HCT 39.6 12/23/2021 1242   PLT 269.0 12/23/2021 1242   MCV 92.0 12/23/2021 1242   MCHC 34.6 12/23/2021 1242   RDW 13.7 12/23/2021 1242   LYMPHSABS 1.2 12/23/2021 1242   MONOABS 0.4 12/23/2021 1242   EOSABS 0.3 12/23/2021 1242   BASOSABS 0.0 12/23/2021 1242    No results found for: "POCLITH", "LITHIUM"   No results found for: "PHENYTOIN", "PHENOBARB", "VALPROATE", "CBMZ"   .res Assessment: Plan:    Patient seen for 30 minutes and time spent discussing recent stressor with testing with cardiologist.  Patient reports that he has been following cardiologist's recommendations faithfully.  Discussed that walking regularly is likely beneficial for his energy, motivation, and anxiety as well. Continue alprazolam as needed for anxiety. Pt to follow-up in 3 months or sooner if clinically indicated.  Patient advised to contact office with any questions, adverse effects, or acute worsening in signs and symptoms.   Arley was seen today for follow-up.  Diagnoses and all orders for this visit:  Anxiety state -     alprazolam (XANAX) 2 MG tablet; TAKE 1/2 TO 1 (ONE-HALF TO ONE) TABLET BY MOUTH THREE TIMES DAILY AS NEEDED FOR ANXIETY AND  FOR  INSOMNIA     Please see After Visit Summary for patient specific instructions.  No future appointments.   No orders of the defined types were placed in this encounter.   -------------------------------

## 2022-10-22 ENCOUNTER — Encounter: Payer: Self-pay | Admitting: Internal Medicine

## 2022-10-22 ENCOUNTER — Ambulatory Visit (INDEPENDENT_AMBULATORY_CARE_PROVIDER_SITE_OTHER): Payer: Medicare PPO | Admitting: Internal Medicine

## 2022-10-22 VITALS — BP 134/72 | HR 69 | Temp 98.0°F | Ht 70.0 in | Wt 164.5 lb

## 2022-10-22 DIAGNOSIS — F411 Generalized anxiety disorder: Secondary | ICD-10-CM | POA: Diagnosis not present

## 2022-10-22 DIAGNOSIS — E538 Deficiency of other specified B group vitamins: Secondary | ICD-10-CM | POA: Diagnosis not present

## 2022-10-22 DIAGNOSIS — Z125 Encounter for screening for malignant neoplasm of prostate: Secondary | ICD-10-CM

## 2022-10-22 DIAGNOSIS — Z0001 Encounter for general adult medical examination with abnormal findings: Secondary | ICD-10-CM

## 2022-10-22 DIAGNOSIS — E559 Vitamin D deficiency, unspecified: Secondary | ICD-10-CM

## 2022-10-22 DIAGNOSIS — E782 Mixed hyperlipidemia: Secondary | ICD-10-CM

## 2022-10-22 DIAGNOSIS — R739 Hyperglycemia, unspecified: Secondary | ICD-10-CM | POA: Diagnosis not present

## 2022-10-22 DIAGNOSIS — I10 Essential (primary) hypertension: Secondary | ICD-10-CM

## 2022-10-22 NOTE — Assessment & Plan Note (Signed)
Last vitamin D Lab Results  Component Value Date   VD25OH 39.50 07/01/2022   Low, to start oral replacement

## 2022-10-22 NOTE — Assessment & Plan Note (Signed)
Lab Results  Component Value Date   HGBA1C 5.7 07/01/2022   Stable, pt to continue current medical treatment  - diet, wt control

## 2022-10-22 NOTE — Assessment & Plan Note (Signed)
BP Readings from Last 3 Encounters:  10/22/22 134/72  08/04/22 (!) 142/72  07/01/22 124/70   Stable, pt to continue medical treatment hyzaar 100-25 mg qd

## 2022-10-22 NOTE — Assessment & Plan Note (Signed)
Age and sex appropriate education and counseling updated with regular exercise and diet Referrals for preventative services - none needed Immunizations addressed - declines covid booster, for Tdap at the pharmacy Smoking counseling  - none needed Evidence for depression or other mood disorder - none significant Most recent labs reviewed. I have personally reviewed and have noted: 1) the patient's medical and social history 2) The patient's current medications and supplements 3) The patient's height, weight, and BMI have been recorded in the chart

## 2022-10-22 NOTE — Assessment & Plan Note (Signed)
Chronic mod to severe persistent, declines any med change, counseling conts to help and needs letter for Formulary Exception done to continue seeing

## 2022-10-22 NOTE — Progress Notes (Signed)
Patient ID: Carl Trujillo, male   DOB: [birth year], 72 y.o.   MRN: [id number]         Chief Complaint:: wellness exam and anxiety, htn., hld, hyperglycemia, low vit d       HPI:  Carl Trujillo is a 72 y.o. male here for wellness exam; declines covid booster, for Tdap at the pharmacy soon, o/w up to date                        Also has been concerned about insurance coverage for specialists - Dr Ellyn Hack card ok, but Thayer Headings PA at [facility]; needs a formulary exception letter.  Tolerating new BP med. Pt denies chest pain, increased sob or doe, wheezing, orthopnea, PND, increased LE swelling, palpitations, dizziness or syncope.   Pt denies polydipsia, polyuria, or new focal neuro s/s.    Pt denies fever, wt loss, night sweats, loss of appetite, or other constitutional symptoms  Declines labs today but will do next visit    Wt Readings from Last 3 Encounters:  10/22/22 164 lb 8 oz (74.6 kg)  08/04/22 161 lb (73 kg)  07/01/22 164 lb (74.4 kg)   BP Readings from Last 3 Encounters:  10/22/22 134/72  08/04/22 (!) 142/72  07/01/22 124/70   Immunization History  Administered Date(s) Administered   Fluad Quad(high Dose 65+) 06/29/2019, 09/27/2020, 07/01/2022   Influenza Split 06/27/2013, 08/22/2016, 07/13/2017   Influenza Whole 07/13/2008, 07/11/2009   Influenza, High Dose Seasonal PF 06/24/2018   Influenza-Unspecified 07/18/2014, 07/01/2021   PFIZER(Purple Top)SARS-COV-2 Vaccination 12/09/2019, 01/03/2020, 07/25/2020, 03/19/2021   Pneumococcal Polysaccharide-23 08/22/2016   Td 07/13/2008   Zoster Recombinat (Shingrix) 06/17/2021, 08/19/2021   Health Maintenance Due  Topic Date Due   DTaP/Tdap/Td (2 - Tdap) 07/13/2018      Past Medical History:  Diagnosis Date   Anxiety    Arthritis of right knee    Arthritis of spine    ED (erectile dysfunction)    Hyperlipidemia 05/2022   Recently diagnosed: Added on rosuvastatin and omega-3 fatty acids   Hypertension     History reviewed. No pertinent surgical history.  reports that he quit smoking about 25 years ago. His smoking use included cigars. He has never used smokeless tobacco. He reports current alcohol use of about 3.0 standard drinks of alcohol per week. He reports that he does not use drugs. family history includes CVA in his sister; Cancer in his mother and another family member; Coronary artery disease in an other family member; Post-traumatic stress disorder in his brother. No Known Allergies Current Outpatient Medications on File Prior to Visit  Medication Sig Dispense Refill   alprazolam (XANAX) 2 MG tablet TAKE 1/2 TO 1 (ONE-HALF TO ONE) TABLET BY MOUTH THREE TIMES DAILY AS NEEDED FOR ANXIETY AND  FOR  INSOMNIA 80 tablet 5   Ascorbic Acid (VITAMIN C) 1000 MG tablet Take 5,000 mg by mouth daily.      aspirin 81 MG tablet Take 81 mg by mouth daily.     Cascara Sagrada 450 MG CAPS Take by mouth.     cholecalciferol (VITAMIN D) 1000 units tablet Take 1,000 Units by mouth daily.     Cod Liver Oil 1000 MG CAPS Take by mouth.     cyclobenzaprine (FLEXERIL) 10 MG tablet Take 1 tablet (10 mg total) by mouth 2 (two) times daily as needed. 180 tablet 3   losartan-hydrochlorothiazide (HYZAAR) 100-25 MG tablet Take 1 tablet by mouth daily.  90 tablet 3   Magnesium-Zinc 133.33-5 MG TABS Take by mouth.     mupirocin nasal ointment (BACTROBAN) 2 % Place 1 application into the nose 2 (two) times daily. Use one-half of tube in each nostril twice daily for five (5) days. After application, press sides of nose together and gently massage. 10 g 0   Omega-3 Fatty Acids (OMEGA 3 PO) Take by mouth. Omega XL - 2 capsules daily     pyridoxine (B-6) 100 MG tablet Take 100 mg by mouth daily.     rosuvastatin (CRESTOR) 10 MG tablet Take 1 tablet (10 mg total) by mouth daily. 90 tablet 3   Specialty Vitamins Products (CENTRUM PERFORMANCE) TABS Take by mouth.     traZODone (DESYREL) 100 MG tablet Take 1 tablet (100 mg  total) by mouth at bedtime. 90 tablet 1   vitamin B-12 (CYANOCOBALAMIN) 1000 MCG tablet Take 1,000 mcg by mouth daily.     No current facility-administered medications on file prior to visit.        ROS:  All others reviewed and negative.  Objective        PE:  BP 134/72   Pulse 69   Temp 98 F (36.7 C) (Temporal)   Ht 5\' 10"  (1.778 m)   Wt 164 lb 8 oz (74.6 kg)   SpO2 95%   BMI 23.60 kg/m                 Constitutional: Pt appears in NAD               HENT: Head: NCAT.                Right Ear: External ear normal.                 Left Ear: External ear normal.                Eyes: . Pupils are equal, round, and reactive to light. Conjunctivae and EOM are normal               Nose: without d/c or deformity               Neck: Neck supple. Gross normal ROM               Cardiovascular: Normal rate and regular rhythm.                 Pulmonary/Chest: Effort normal and breath sounds without rales or wheezing.                Abd:  Soft, NT, ND, + BS, no organomegaly               Neurological: Pt is alert. At baseline orientation, motor grossly intact               Skin: Skin is warm. No rashes, no other new lesions, LE edema - none               Psychiatric: Pt behavior is normal without agitation but pressured speech  Micro: none  Cardiac tracings I have personally interpreted today:  none  Pertinent Radiological findings (summarize): none   Lab Results  Component Value Date   WBC 3.8 (L) 12/23/2021   HGB 13.7 12/23/2021   HCT 39.6 12/23/2021   PLT 269.0 12/23/2021   GLUCOSE 91 07/01/2022   CHOL 159 07/01/2022   TRIG 114.0 07/01/2022   HDL 55.90 07/01/2022  LDLCALC 80 07/01/2022   ALT 28 07/01/2022   AST 27 07/01/2022   NA 137 07/01/2022   K 3.6 07/01/2022   CL 100 07/01/2022   CREATININE 1.17 07/01/2022   BUN 17 07/01/2022   CO2 31 07/01/2022   TSH 3.24 12/23/2021   PSA 0.98 12/23/2021   HGBA1C 5.7 07/01/2022   Assessment/Plan:  Carl Trujillo is a 72  y.o. Black or African American [2] male with  has a past medical history of Anxiety, Arthritis of right knee, Arthritis of spine, ED (erectile dysfunction), Hyperlipidemia (05/2022), and Hypertension.  Vitamin D deficiency Last vitamin D Lab Results  Component Value Date   VD25OH 39.50 07/01/2022   Low, to start oral replacement   Encounter for well adult exam with abnormal findings Age and sex appropriate education and counseling updated with regular exercise and diet Referrals for preventative services - none needed Immunizations addressed - declines covid booster, for Tdap at the pharmacy Smoking counseling  - none needed Evidence for depression or other mood disorder - none significant Most recent labs reviewed. I have personally reviewed and have noted: 1) the patient's medical and social history 2) The patient's current medications and supplements 3) The patient's height, weight, and BMI have been recorded in the chart   Essential hypertension BP Readings from Last 3 Encounters:  10/22/22 134/72  08/04/22 (!) 142/72  07/01/22 124/70   Stable, pt to continue medical treatment hyzaar 100-25 mg qd   Generalized anxiety disorder Chronic mod to severe persistent, declines any med change, counseling conts to help and needs letter for Formulary Exception done to continue seeing   Hyperglycemia Lab Results  Component Value Date   HGBA1C 5.7 07/01/2022   Stable, pt to continue current medical treatment  - diet, wt control   Mixed hyperlipidemia Lab Results  Component Value Date   [place] 80 07/01/2022   Uncontrolled, goal ld l< 70,, pt to continue current statin crestor 10 mg qd and lower chol diet, will recheck lab next visit  Followup: Return in about 6 months (around 04/22/2023).  [name], MD 10/22/2022 12:50 PM [facility]

## 2022-10-22 NOTE — Assessment & Plan Note (Addendum)
Lab Results  Component Value Date   [place] 80 07/01/2022   Uncontrolled, goal ld l< 70,, pt to continue current statin crestor 10 mg qd and lower chol diet, will recheck lab next visit

## 2022-10-22 NOTE — Patient Instructions (Addendum)
You are given the formulary exception letter  Please consider having the Tdap tetanus and Prevnar 20 pneumonia shots at the pharmacy  Please continue all other medications as before, and refills have been done if requested.  Please have the pharmacy call with any other refills you may need.  Please continue your efforts at being more active, low cholesterol diet, and weight control.  You are otherwise up to date with prevention measures today.  Please keep your appointments with your specialists as you may have planned  Please make an Appointment to return in 6 months, or sooner if needed, also with Labs done at the [facility] lab a few days ahead

## 2022-10-27 ENCOUNTER — Other Ambulatory Visit: Payer: Self-pay

## 2022-11-05 ENCOUNTER — Telehealth: Payer: Self-pay | Admitting: Cardiology

## 2022-11-05 NOTE — Telephone Encounter (Signed)
Patient states he was told to walk more at his last appointment. He says he had been walking 30-45 minutes everyday, but now it has been too cold. He says he will be joining a gym and working out 2-3 times a week.

## 2022-11-05 NOTE — Telephone Encounter (Signed)
Thnx for the update

## 2022-11-05 NOTE — Telephone Encounter (Signed)
Will route to MD to make aware as FYI.  Thank you!

## 2022-11-13 DIAGNOSIS — R3912 Poor urinary stream: Secondary | ICD-10-CM | POA: Diagnosis not present

## 2022-11-13 DIAGNOSIS — D573 Sickle-cell trait: Secondary | ICD-10-CM | POA: Diagnosis not present

## 2022-11-13 DIAGNOSIS — R3915 Urgency of urination: Secondary | ICD-10-CM | POA: Diagnosis not present

## 2022-11-13 DIAGNOSIS — Z125 Encounter for screening for malignant neoplasm of prostate: Secondary | ICD-10-CM | POA: Diagnosis not present

## 2022-11-13 DIAGNOSIS — N5201 Erectile dysfunction due to arterial insufficiency: Secondary | ICD-10-CM | POA: Diagnosis not present

## 2022-11-13 DIAGNOSIS — N401 Enlarged prostate with lower urinary tract symptoms: Secondary | ICD-10-CM | POA: Diagnosis not present

## 2022-11-19 ENCOUNTER — Ambulatory Visit (INDEPENDENT_AMBULATORY_CARE_PROVIDER_SITE_OTHER): Payer: Medicare PPO | Admitting: Psychiatry

## 2022-11-19 ENCOUNTER — Encounter: Payer: Self-pay | Admitting: Psychiatry

## 2022-11-19 DIAGNOSIS — F411 Generalized anxiety disorder: Secondary | ICD-10-CM | POA: Diagnosis not present

## 2022-11-19 MED ORDER — ALPRAZOLAM 2 MG PO TABS: ORAL_TABLET | ORAL | 3 refills | Status: DC

## 2022-11-19 NOTE — Progress Notes (Signed)
Carl Trujillo [id number] August 23, 1951 72 y.o.  Subjective:   Patient ID:  Carl Trujillo is a 72 y.o. (DOB 1951/05/02) male.  Chief Complaint:  Chief Complaint  Patient presents with   Follow-up    Anxiety    HPI Carl Trujillo presents to the office today for follow-up of anxiety. He reports that his anxiety has been "up and down." He reports anxiety in response to current events and violent crime. He reports sadness in response to deaths from violent crime. He reports that he has been worried about a friend that was more distanced and was able to talk with her recently. He reports Xanax remains effective for his anxiety. Appetite has been good. He reports that he has been eating vegetables regularly. Sleeping well. Energy and motivation have been good. Denies SI.   He reports that the holidays went well. He went to several holiday gatherings.   He enjoys reading and keeping up with current events. Retired for 24 years.   He reports that he stopped ETOH on October 6. He reports that he was previously drinking red wine some nights. Former smoker.   Xanax last filled 10/17/21.x2  AUDIT    Flowsheet Row Clinical Support from 05/09/2022 in [place] at [facility]  Alcohol Use Disorder Identification Test Final Score (AUDIT) 3      Mini-Mental    Flowsheet Row Clinical Support from 03/17/2018 in [place]  Total Score (max 30 points ) 29      PHQ2-9    [place] Visit from 10/22/2022 in [place] at [facility] Visit from 07/01/2022 in [place] at [place] from 05/09/2022 in [place] at [facility] Visit from 12/26/2021 in [place] at [facility] Visit from 12/06/2020 in [place] at  [facility]  PHQ-2 Total Score 1 0 0 0 0  PHQ-9 Total Score 0 0 -- -- --      Flowsheet Row ED  from 07/29/2021 in [facility] at [facility] ED from 07/11/2021 in [facility] Emergency Department at [place] Error: Question 6 not populated No Risk        Review of Systems:  Review of Systems  Gastrointestinal: Negative.   Musculoskeletal:  Negative for gait problem.  Neurological:  Negative for headaches.  Psychiatric/Behavioral:         Please refer to HPI    Medications: I have reviewed the patient's current medications.  Current Outpatient Medications  Medication Sig Dispense Refill   Ascorbic Acid (VITAMIN C) 1000 MG tablet Take 5,000 mg by mouth daily.      [START ON 02/11/2023] alprazolam (XANAX) 2 MG tablet TAKE 1/2 TO 1 (ONE-HALF TO ONE) TABLET BY MOUTH THREE TIMES DAILY AS NEEDED FOR ANXIETY AND  FOR  INSOMNIA 80 tablet 3   aspirin 81 MG tablet Take 81 mg by mouth daily.     Cascara Sagrada 450 MG CAPS Take by mouth.     cholecalciferol (VITAMIN D) 1000 units tablet Take 1,000 Units by mouth daily.     Cod Liver Oil 1000 MG CAPS Take by mouth.     cyclobenzaprine (FLEXERIL) 10 MG tablet Take 1 tablet (10 mg total) by mouth 2 (two) times daily as needed. 180 tablet 3   losartan-hydrochlorothiazide (HYZAAR) 100-25 MG tablet Take 1 tablet by mouth daily. 90 tablet 3  Magnesium-Zinc 133.33-5 MG TABS Take by mouth.     Omega-3 Fatty Acids (OMEGA 3 PO) Take by mouth. Omega XL - 2 capsules daily     pyridoxine (B-6) 100 MG tablet Take 100 mg by mouth daily.     rosuvastatin (CRESTOR) 10 MG tablet Take 1 tablet (10 mg total) by mouth daily. 90 tablet 3   Specialty Vitamins Products (CENTRUM PERFORMANCE) TABS Take by mouth.     traZODone (DESYREL) 100 MG tablet Take 1 tablet (100 mg total) by mouth at bedtime. 90 tablet 1   vitamin B-12 (CYANOCOBALAMIN) 1000 MCG tablet Take 1,000 mcg by mouth daily.     No current facility-administered medications for this visit.    Medication Side Effects: None  Allergies: No Known  Allergies  Past Medical History:  Diagnosis Date   Anxiety    Arthritis of right knee    Arthritis of spine    ED (erectile dysfunction)    Hyperlipidemia 05/2022   Recently diagnosed: Added on rosuvastatin and omega-3 fatty acids   Hypertension     Past Medical History, Surgical history, Social history, and Family history were reviewed and updated as appropriate.   Please see review of systems for further details on the patient's review from today.   Objective:   Physical Exam:  There were no vitals taken for this visit.  Physical Exam Constitutional:      General: He is not in acute distress. Musculoskeletal:        General: No deformity.  Neurological:     Mental Status: He is alert and oriented to person, place, and time.     Coordination: Coordination normal.  Psychiatric:        Attention and Perception: Attention and perception normal. He does not perceive auditory or visual hallucinations.        Mood and Affect: Mood normal. Mood is not anxious or depressed. Affect is not labile, blunt, angry or inappropriate.        Speech: Speech normal.        Behavior: Behavior normal.        Thought Content: Thought content normal. Thought content is not paranoid or delusional. Thought content does not include homicidal or suicidal ideation. Thought content does not include homicidal or suicidal plan.        Cognition and Memory: Cognition and memory normal.        Judgment: Judgment normal.     Comments: Insight intact     Lab Review:     Component Value Date/Time   NA 137 07/01/2022 1120   K 3.6 07/01/2022 1120   CL 100 07/01/2022 1120   CO2 31 07/01/2022 1120   GLUCOSE 91 07/01/2022 1120   BUN 17 07/01/2022 1120   CREATININE 1.17 07/01/2022 1120   CALCIUM 9.3 07/01/2022 1120   PROT 6.9 07/01/2022 1120   ALBUMIN 4.1 07/01/2022 1120   AST 27 07/01/2022 1120   ALT 28 07/01/2022 1120   ALKPHOS 86 07/01/2022 1120   BILITOT 1.3 (H) 07/01/2022 1120   GFRNONAA 88.07  11/14/2009 1036       Component Value Date/Time   WBC 3.8 (L) 12/23/2021 1242   RBC 4.31 12/23/2021 1242   HGB 13.7 12/23/2021 1242   HCT 39.6 12/23/2021 1242   PLT 269.0 12/23/2021 1242   MCV 92.0 12/23/2021 1242   MCHC 34.6 12/23/2021 1242   RDW 13.7 12/23/2021 1242   LYMPHSABS 1.2 12/23/2021 1242   MONOABS 0.4 12/23/2021 1242  EOSABS 0.3 12/23/2021 1242   BASOSABS 0.0 12/23/2021 1242    No results found for: "POCLITH", "LITHIUM"   No results found for: "PHENYTOIN", "PHENOBARB", "VALPROATE", "CBMZ"   .res Assessment: Plan:   Will continue current plan of care since target signs and symptoms are well controlled without any tolerability issues. Continue Xanax 2 mg 1/2-1 tab po TID prn anxiety and insomnia.  Pt to follow-up in 6 months or sooner if clinically indicated.  Patient advised to contact office with any questions, adverse effects, or acute worsening in signs and symptoms.   Shellie was seen today for follow-up.  Diagnoses and all orders for this visit:  Anxiety state -     alprazolam (XANAX) 2 MG tablet; TAKE 1/2 TO 1 (ONE-HALF TO ONE) TABLET BY MOUTH THREE TIMES DAILY AS NEEDED FOR ANXIETY AND  FOR  INSOMNIA     Please see After Visit Summary for patient specific instructions.  Future Appointments  Date Time Provider [place]  02/16/2023  9:30 AM Leonie Man, MD [facility] None  05/20/2023 10:30 AM Thayer Headings, PMHNP [facility] None    No orders of the defined types were placed in this encounter.   -------------------------------

## 2023-01-02 ENCOUNTER — Other Ambulatory Visit (HOSPITAL_BASED_OUTPATIENT_CLINIC_OR_DEPARTMENT_OTHER): Payer: Self-pay

## 2023-01-13 ENCOUNTER — Other Ambulatory Visit: Payer: Self-pay | Admitting: Internal Medicine

## 2023-01-13 DIAGNOSIS — G8929 Other chronic pain: Secondary | ICD-10-CM

## 2023-01-13 DIAGNOSIS — I10 Essential (primary) hypertension: Secondary | ICD-10-CM

## 2023-01-15 ENCOUNTER — Other Ambulatory Visit: Payer: Self-pay | Admitting: Internal Medicine

## 2023-01-15 DIAGNOSIS — I10 Essential (primary) hypertension: Secondary | ICD-10-CM

## 2023-01-16 ENCOUNTER — Other Ambulatory Visit: Payer: Self-pay | Admitting: Internal Medicine

## 2023-01-16 DIAGNOSIS — G8929 Other chronic pain: Secondary | ICD-10-CM

## 2023-02-12 ENCOUNTER — Other Ambulatory Visit: Payer: Self-pay | Admitting: Internal Medicine

## 2023-02-12 DIAGNOSIS — G8929 Other chronic pain: Secondary | ICD-10-CM

## 2023-02-16 ENCOUNTER — Encounter: Payer: Self-pay | Admitting: Cardiology

## 2023-02-16 ENCOUNTER — Ambulatory Visit: Payer: Medicare HMO | Attending: Cardiology | Admitting: Cardiology

## 2023-02-16 VITALS — BP 108/72 | HR 67 | Ht 70.0 in | Wt 165.4 lb

## 2023-02-16 DIAGNOSIS — I10 Essential (primary) hypertension: Secondary | ICD-10-CM | POA: Diagnosis not present

## 2023-02-16 DIAGNOSIS — G4733 Obstructive sleep apnea (adult) (pediatric): Secondary | ICD-10-CM | POA: Diagnosis not present

## 2023-02-16 DIAGNOSIS — R739 Hyperglycemia, unspecified: Secondary | ICD-10-CM

## 2023-02-16 DIAGNOSIS — R931 Abnormal findings on diagnostic imaging of heart and coronary circulation: Secondary | ICD-10-CM

## 2023-02-16 DIAGNOSIS — E782 Mixed hyperlipidemia: Secondary | ICD-10-CM

## 2023-02-16 NOTE — Assessment & Plan Note (Signed)
Stopped CPAP in 2019 -- used 2007-2019.  Lost wgt - no longer "needed".

## 2023-02-16 NOTE — Assessment & Plan Note (Addendum)
Completely asymptomatic -- no need for further evaluation at this time.  Discussed Pathophysiology of Lipid Plaque.  BP controlled  On statin - goal LDL < 70 (target < 55) == > Recheck labs in May have to timeframe.  Anticipate titrating up rosuvastatin at goal. On ASA

## 2023-02-16 NOTE — Patient Instructions (Signed)
Medication Instructions:  No changes  *If you need a refill on your cardiac medications before your next appointment, please call your pharmacy*   Lab Work: late MAY/JUNE LIPID CMP Hgba1C If you have labs (blood work) drawn today and your tests are completely normal, you will receive your results only by: MyChart Message (if you have MyChart) OR A paper copy in the mail If you have any lab test that is abnormal or we need to change your treatment, we will call you to review the results.   Testing/Procedures: Not needed   Follow-Up: At [facility], you and your health needs are our priority.  As part of our continuing mission to provide you with exceptional heart care, we have created designated Provider Care Teams.  These Care Teams include your primary Cardiologist (physician) and Advanced Practice Providers (APPs -  Physician Assistants and Nurse Practitioners) who all work together to provide you with the care you need, when you need it.     Your next appointment:   6 month(s)  The format for your next appointment:   In Person  Provider:   Edd Fabian, FNP    Then, Bryan Lemma, MD will plan to see you again in 12 month(s).

## 2023-02-16 NOTE — Progress Notes (Signed)
Primary Care Provider: Corwin Levins, MD [facility] Cardiologist: Bryan Lemma, MD Electrophysiologist: None  Clinic Note: Chief Complaint  Patient presents with   Follow-up    Feeling great.  No problems.   Coronary Artery Disease    No angina.  Says he feels great   ===================================  ASSESSMENT/PLAN   Problem List Items Addressed This Visit       Cardiology Problems   Mixed hyperlipidemia (Chronic)    LDL 80 in Sept 2023 > started on 10 mg Crestor - recheck Lipids now.       Relevant Orders   Lipid panel   Comprehensive metabolic panel   Hemoglobin A1c   Essential hypertension - Primary (Chronic)    Excellent Control - continue ARB-HCTZ      Agatston coronary artery calcium score greater than 400 (Chronic)    Completely asymptomatic -- no need for further evaluation at this time.  Discussed Pathophysiology of Lipid Plaque.  BP controlled  On statin - goal LDL < 70 (target < 55) == > Recheck labs in May have to timeframe.  Anticipate titrating up rosuvastatin at goal. On ASA      Relevant Orders   EKG 12-Lead   Lipid panel   Comprehensive metabolic panel   Hemoglobin A1c     Other   Obstructive sleep apnea    Stopped CPAP in 2019 -- used 2007-2019.  Lost wgt - no longer "needed".       Hyperglycemia (Chronic)    Check A1c & Chem panel       Relevant Orders   Lipid panel   Comprehensive metabolic panel   Hemoglobin A1c    ===================================  HPI:    Carl Trujillo is a 72 y.o. male with a PMH notable for HTN, HLD, ED with elevated Coronary Calcium Score (574) who presents today for 49-month follow-up.  He returns today at the request of Dagmawi, Balster, MD.  Vic Flowers was seen for initial consultation on August 04 2022: He is doing well with no active anginal symptoms or heart failure symptoms.  We therefore discussed the pathophysiology of coronary disease with the role of coronary calcification  indicating presence of plaque.  In the absence of symptoms, we chose not to proceed with ischemic evaluation.  Discussed importance of treating blood pressure and lipids and blood sugars.  LDL goal less than 55/less than 70, was 81 evaluation.  PCP had actually started statin.  Mild glucose intolerance, discussed importance of dietary modification.  Continue on ARB-HCTZ.  Also discussed importance of using CPAP.  Recent Hospitalizations: N/A  Reviewed  CV studies:    The following studies were reviewed today: (if available, images/films reviewed: From Epic Chart or Care Everywhere) No new studies:  Interval History:   Carl Trujillo returns here today for 53-month follow-up still remaining totally asymptomatic.  He is very active does routine yard work and recently joined [employer] to do exercise.  His exercise has been curtailed little bit recently because his brother now lives with him and he is acting as a Engineer, structural.  This is keeping him from ELB is free to exercise, but he is still doing walking.  Completely asymptomatic regarding standpoint with no chest pain pressure or dyspnea at rest or exertion.  He spent a lot of time talking about all kinds of other things.  He questions medications and monitor for.  We discussed the rosuvastatin and losartan-HCTZ we also discussed aspirin.  He wanted to verify  what he was taking. He denies any lightheadedness dizziness or wooziness.  No syncope or near syncope.  No TIA or amaurosis fugax.  No PND, orthopnea or edema.  No claudication.  Only family history of any Heart diseases that he had a brother who been via not better and long-term smoker with issues from being a veteran.  He recently had a cardiac cath with a stent placed.  He tells me that he does not have a lot of, with his brother-not a heavy smoker like his brother nor alcohol and other risk factors. => His brother is now living with him.  REVIEWED OF SYSTEMS   Review of Systems   Constitutional:  Negative for malaise/fatigue and weight loss.  HENT:  Negative for congestion and nosebleeds.        Very poor teeth  Respiratory:  Negative for shortness of breath.   Gastrointestinal:  Negative for blood in stool and melena.  Genitourinary:  Negative for hematuria.  Musculoskeletal:  Negative for joint pain (Just minor aches and pain).  Neurological:  Negative for dizziness (Only if he does not drink enough water) and focal weakness.  Psychiatric/Behavioral: Negative.     I have reviewed and (if needed) personally updated the patient's problem list, medications, allergies, past medical and surgical history, social and family history.   PAST MEDICAL HISTORY   Past Medical History:  Diagnosis Date   Agatston coronary artery calcium score greater than 400 08/04/2022   Coronary Calcium Score 07/11/2022: Total Calcium Score 574, LAD 493, LCx 25, RCA 56.    Anxiety    Arthritis of right knee    Arthritis of spine    ED (erectile dysfunction)    Hyperlipidemia 05/2022   Recently diagnosed: Added on rosuvastatin and omega-3 fatty acids   Hypertension     PAST SURGICAL HISTORY   History reviewed. No pertinent surgical history.   MEDICATIONS/ALLERGIES   Current Meds  Medication Sig   alprazolam (XANAX) 2 MG tablet TAKE 1/2 TO 1 (ONE-HALF TO ONE) TABLET BY MOUTH THREE TIMES DAILY AS NEEDED FOR ANXIETY AND  FOR  INSOMNIA   Ascorbic Acid (VITAMIN C) 1000 MG tablet Take 5,000 mg by mouth daily.    aspirin 81 MG tablet Take 81 mg by mouth daily.   Cascara Sagrada 450 MG CAPS Take by mouth.   cholecalciferol (VITAMIN D) 1000 units tablet Take 1,000 Units by mouth daily.   Cod Liver Oil 1000 MG CAPS Take by mouth.   cyclobenzaprine (FLEXERIL) 10 MG tablet Take 1 tablet (10 mg total) by mouth 2 (two) times daily as needed.   losartan-hydrochlorothiazide (HYZAAR) 100-25 MG tablet Take 1 tablet by mouth once daily   Magnesium-Zinc 133.33-5 MG TABS Take by mouth.   Omega-3  Fatty Acids (OMEGA 3 PO) Take by mouth. Omega XL - 2 capsules daily   pyridoxine (B-6) 100 MG tablet Take 100 mg by mouth daily.   rosuvastatin (CRESTOR) 10 MG tablet Take 1 tablet (10 mg total) by mouth daily.   Specialty Vitamins Products (CENTRUM PERFORMANCE) TABS Take by mouth.   traZODone (DESYREL) 100 MG tablet Take 1 tablet (100 mg total) by mouth at bedtime.   vitamin B-12 (CYANOCOBALAMIN) 1000 MCG tablet Take 1,000 mcg by mouth daily.    No Known Allergies  SOCIAL HISTORY/FAMILY HISTORY   Reviewed in Epic:  Pertinent findings:  Social History   Tobacco Use   Smoking status: Former    Types: Cigars    Quit date: 10/13/1997  Years since quitting: 25.3   Smokeless tobacco: Never   Tobacco comments:    current smoker of cigars in the pm - stopped cigarettes 59yrs ago  Vaping Use   Vaping Use: Never used  Substance Use Topics   Alcohol use: Not Currently   Drug use: No   Social History   Social History Narrative   Havok says he has been taking care of himself since he was age 74.  His parents were essentially nonexistent.  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   (He tells me that he worked at the [employer] ")    He has not been working since early 60s.  He just "takes care of himself.  He lives alone after his long-term friend passed.  He used to walk quite a bit but then stopped doing that once his friend was no longer available.    OBJCTIVE -PE, EKG, labs   Wt Readings from Last 3 Encounters:  02/16/23 165 lb 6.4 oz (75 kg)  10/22/22 164 lb 8 oz (74.6 kg)  08/04/22 161 lb (73 kg)    Physical Exam: BP 108/72   Pulse 67   Ht 5\' 10"  (1.778 m)   Wt 165 lb 6.4 oz (75 kg)   SpO2 96%   BMI 23.73 kg/m  Physical Exam Vitals reviewed.  Constitutional:      General: He is not in acute distress.    Appearance: Normal appearance. He is normal weight. He is not ill-appearing or toxic-appearing.   HENT:     Head: Normocephalic and atraumatic.     Mouth/Throat:     Comments: Very poor teeth-missing several teeth Eyes:     Pupils: Pupils are equal, round, and reactive to light.  Neck:     Vascular: No carotid bruit or JVD.  Cardiovascular:     Rate and Rhythm: Normal rate and regular rhythm. No extrasystoles are present.    Chest Wall: PMI is not displaced.     Pulses: Normal pulses and intact distal pulses.     Heart sounds: S1 normal and S2 normal. No murmur heard.    No friction rub. No gallop.  Pulmonary:     Effort: Pulmonary effort is normal. No respiratory distress.     Breath sounds: Normal breath sounds. No wheezing, rhonchi or rales.  Musculoskeletal:        General: Swelling (Trivial) present.     Cervical back: Normal range of motion and neck supple.  Skin:    General: Skin is warm and dry.  Neurological:     General: No focal deficit present.     Mental Status: He is alert and oriented to person, place, and time.     Cranial Nerves: No cranial nerve deficit.     Gait: Gait normal.  Psychiatric:        Mood and Affect: Mood normal.        Behavior: Behavior normal.     Comments: Very casual, nonchalant in his mannerisms and speaking.  Does not seem to be all that concerned about his heart CT findings.  He just thought about how well he feels and speaks about multiple other noncardiac issues.  Somewhat tangential.     Adult ECG Report  Rate: 67 ;  Rhythm: normal sinus rhythm and 1  AVB ; nonspecific ST and T wave changes.  No ischemic changes.  Normal axis,.  Narrative Interpretation: Stable  Recent Labs: Reviewed Lab Results  Component Value Date   CHOL 159 07/01/2022   HDL 55.90 07/01/2022   LDLCALC 80 07/01/2022   TRIG 114.0 07/01/2022   CHOLHDL 3 07/01/2022   Lab Results  Component Value Date   CREATININE 1.17 07/01/2022   BUN 17 07/01/2022   NA 137 07/01/2022   K 3.6 07/01/2022   CL 100 07/01/2022   CO2 31 07/01/2022      Latest Ref Rng  & Units 12/23/2021   12:42 PM 12/06/2020   12:50 PM 12/22/2019    8:53 AM  CBC  WBC 4.0 - 10.5 K/uL 3.8  4.4  4.5   Hemoglobin 13.0 - 17.0 g/dL 16.1  09.6  04.5   Hematocrit 39.0 - 52.0 % 39.6  43.0  44.6   Platelets 150.0 - 400.0 K/uL 269.0  266.0  232.0     Lab Results  Component Value Date   HGBA1C 5.7 07/01/2022   Lab Results  Component Value Date   TSH 3.24 12/23/2021    ================================================== I spent a total of 22 minutes with the patient spent in direct patient consultation.  Additional time spent with chart review  / charting (studies, outside notes, etc): 15 min Total Time: 37 min  Current medicines are reviewed at length with the patient today.  (+/- concerns) n/a  Notice: This dictation was prepared with Dragon dictation along with smart phrase technology. Any transcriptional errors that result from this process are unintentional and may not be corrected upon review.  Studies Ordered:   Orders Placed This Encounter  Procedures   Lipid panel   Comprehensive metabolic panel   Hemoglobin A1c   EKG 12-Lead   No orders of the defined types were placed in this encounter.   Patient Instructions / Medication Changes & Studies & Tests Ordered   Patient Instructions  Medication Instructions:  No changes  *If you need a refill on your cardiac medications before your next appointment, please call your pharmacy*   Lab Work: late MAY/JUNE LIPID CMP Hgba1C If you have labs (blood work) drawn today and your tests are completely normal, you will receive your results only by: MyChart Message (if you have MyChart) OR A paper copy in the mail If you have any lab test that is abnormal or we need to change your treatment, we will call you to review the results.   Testing/Procedures: Not needed   Follow-Up: At [facility], you and your health needs are our priority.  As part of our continuing mission to provide you with exceptional  heart care, we have created designated Provider Care Teams.  These Care Teams include your primary Cardiologist (physician) and Advanced Practice Providers (APPs -  Physician Assistants and Nurse Practitioners) who all work together to provide you with the care you need, when you need it.     Your next appointment:   6 month(s)  The format for your next appointment:   In Person  Provider:   Edd Fabian, FNP    Then, Bryan Lemma, MD will plan to see you again in 12 month(s).       Marykay Lex, MD, MS Bryan Lemma, M.D., M.S. Interventional Cardiologist  [facility]  Pager # [phone] Phone # [phone] [address] [place] [address], [place] [address]   Thank you for choosing [facility] [place]!!

## 2023-02-16 NOTE — Assessment & Plan Note (Signed)
Excellent Control - continue ARB-HCTZ

## 2023-02-16 NOTE — Assessment & Plan Note (Signed)
Check A1c & Chem panel

## 2023-02-16 NOTE — Assessment & Plan Note (Signed)
LDL 80 in Sept 2023 > started on 10 mg Crestor - recheck Lipids now.

## 2023-02-22 ENCOUNTER — Encounter: Payer: Self-pay | Admitting: Cardiology

## 2023-02-23 ENCOUNTER — Other Ambulatory Visit: Payer: Self-pay

## 2023-02-26 ENCOUNTER — Other Ambulatory Visit: Payer: Self-pay | Admitting: Internal Medicine

## 2023-02-26 DIAGNOSIS — G8929 Other chronic pain: Secondary | ICD-10-CM

## 2023-03-04 ENCOUNTER — Telehealth: Payer: Self-pay

## 2023-03-04 MED ORDER — DICLOFENAC SODIUM 75 MG PO TBEC: 75.0000 mg | DELAYED_RELEASE_TABLET | Freq: Two times a day (BID) | ORAL | 2 refills | Status: DC | PRN

## 2023-03-04 NOTE — Telephone Encounter (Signed)
Called pt no answer LMOM rx has been sent to [facility]...Raechel Chute

## 2023-03-04 NOTE — Telephone Encounter (Signed)
Ok done to [facility] ?

## 2023-03-12 ENCOUNTER — Other Ambulatory Visit (INDEPENDENT_AMBULATORY_CARE_PROVIDER_SITE_OTHER): Payer: Medicare HMO

## 2023-03-12 DIAGNOSIS — E782 Mixed hyperlipidemia: Secondary | ICD-10-CM

## 2023-03-12 DIAGNOSIS — E559 Vitamin D deficiency, unspecified: Secondary | ICD-10-CM | POA: Diagnosis not present

## 2023-03-12 DIAGNOSIS — E538 Deficiency of other specified B group vitamins: Secondary | ICD-10-CM | POA: Diagnosis not present

## 2023-03-12 DIAGNOSIS — Z125 Encounter for screening for malignant neoplasm of prostate: Secondary | ICD-10-CM

## 2023-03-12 DIAGNOSIS — R739 Hyperglycemia, unspecified: Secondary | ICD-10-CM | POA: Diagnosis not present

## 2023-03-12 LAB — URINALYSIS, ROUTINE W REFLEX MICROSCOPIC
Bilirubin Urine: NEGATIVE
Hgb urine dipstick: NEGATIVE
Ketones, ur: NEGATIVE
Leukocytes,Ua: NEGATIVE
Nitrite: NEGATIVE
RBC / HPF: NONE SEEN (ref 0–?)
Specific Gravity, Urine: <=1.005 — AB (ref 1.000–1.030)
Total Protein, Urine: NEGATIVE
Urine Glucose: NEGATIVE
Urobilinogen, UA: 0.2 (ref 0.0–1.0)
WBC, UA: NONE SEEN (ref 0–?)
pH: 7 (ref 5.0–8.0)

## 2023-03-12 LAB — BASIC METABOLIC PANEL
BUN: 17 mg/dL (ref 6–23)
CO2: 31 mEq/L (ref 19–32)
Calcium: 8.8 mg/dL (ref 8.4–10.5)
Chloride: 102 mEq/L (ref 96–112)
Creatinine, Ser: 1.14 mg/dL (ref 0.40–1.50)
GFR: 64.27 mL/min (ref >60.00)
Glucose, Bld: 92 mg/dL (ref 70–99)
Potassium: 4.1 mEq/L (ref 3.5–5.1)
Sodium: 141 mEq/L (ref 135–145)

## 2023-03-12 LAB — LIPID PANEL
Cholesterol: 107 mg/dL (ref 0–200)
HDL: 44.8 mg/dL (ref >39.00)
LDL Cholesterol: 51 mg/dL (ref 0–99)
NonHDL: 61.91
Total CHOL/HDL Ratio: 2
Triglycerides: 57 mg/dL (ref 0.0–149.0)
VLDL: 11.4 mg/dL (ref 0.0–40.0)

## 2023-03-12 LAB — CBC WITH DIFFERENTIAL/PLATELET
Basophils Absolute: 0 10*3/uL (ref 0.0–0.1)
Basophils Relative: 0.4 % (ref 0.0–3.0)
Eosinophils Absolute: 0.1 10*3/uL (ref 0.0–0.7)
Eosinophils Relative: 3.3 % (ref 0.0–5.0)
HCT: 41.5 % (ref 39.0–52.0)
Hemoglobin: 13.8 g/dL (ref 13.0–17.0)
Lymphocytes Relative: 36.6 % (ref 12.0–46.0)
Lymphs Abs: 1.1 10*3/uL (ref 0.7–4.0)
MCHC: 33.1 g/dL (ref 30.0–36.0)
MCV: 91.2 fl (ref 78.0–100.0)
Monocytes Absolute: 0.4 10*3/uL (ref 0.1–1.0)
Monocytes Relative: 11.6 % (ref 3.0–12.0)
Neutro Abs: 1.5 10*3/uL (ref 1.4–7.7)
Neutrophils Relative %: 48.1 % (ref 43.0–77.0)
Platelets: 217 10*3/uL (ref 150.0–400.0)
RBC: 4.55 Mil/uL (ref 4.22–5.81)
RDW: 13.2 % (ref 11.5–15.5)
WBC: 3.1 10*3/uL — ABNORMAL LOW (ref 4.0–10.5)

## 2023-03-12 LAB — HEPATIC FUNCTION PANEL
ALT: 26 U/L (ref 0–53)
AST: 26 U/L (ref 0–37)
Albumin: 4.4 g/dL (ref 3.5–5.2)
Alkaline Phosphatase: 79 U/L (ref 39–117)
Bilirubin, Direct: 0.2 mg/dL (ref 0.0–0.3)
Total Bilirubin: 0.8 mg/dL (ref 0.2–1.2)
Total Protein: 6.9 g/dL (ref 6.0–8.3)

## 2023-03-12 LAB — TSH: TSH: 4.58 u[IU]/mL (ref 0.35–5.50)

## 2023-03-12 LAB — HEMOGLOBIN A1C: Hgb A1c MFr Bld: 5.6 % (ref 4.6–6.5)

## 2023-03-12 LAB — VITAMIN B12: Vitamin B-12: 1330 pg/mL — ABNORMAL HIGH (ref 211–911)

## 2023-03-12 LAB — VITAMIN D 25 HYDROXY (VIT D DEFICIENCY, FRACTURES): VITD: 51.95 ng/mL (ref 30.00–100.00)

## 2023-03-12 LAB — PSA: PSA: 1.11 ng/mL (ref 0.10–4.00)

## 2023-03-16 ENCOUNTER — Encounter: Payer: Self-pay | Admitting: Internal Medicine

## 2023-03-16 ENCOUNTER — Ambulatory Visit (INDEPENDENT_AMBULATORY_CARE_PROVIDER_SITE_OTHER): Payer: Medicare HMO | Admitting: Internal Medicine

## 2023-03-16 VITALS — BP 128/76 | HR 65 | Temp 97.7°F | Ht 70.0 in | Wt 163.0 lb

## 2023-03-16 DIAGNOSIS — I10 Essential (primary) hypertension: Secondary | ICD-10-CM | POA: Diagnosis not present

## 2023-03-16 DIAGNOSIS — E782 Mixed hyperlipidemia: Secondary | ICD-10-CM

## 2023-03-16 DIAGNOSIS — R739 Hyperglycemia, unspecified: Secondary | ICD-10-CM

## 2023-03-16 DIAGNOSIS — E559 Vitamin D deficiency, unspecified: Secondary | ICD-10-CM

## 2023-03-16 DIAGNOSIS — E538 Deficiency of other specified B group vitamins: Secondary | ICD-10-CM

## 2023-03-16 NOTE — Assessment & Plan Note (Signed)
Lab Results  Component Value Date   HGBA1C 5.6 03/12/2023   Stable, pt to continue current medical treatment  - diet, wt control

## 2023-03-16 NOTE — Assessment & Plan Note (Signed)
Last vitamin D Lab Results  Component Value Date   VD25OH 51.95 03/12/2023   Stable, cont oral replacement

## 2023-03-16 NOTE — Patient Instructions (Addendum)
MyChart Username DCLOWERY  Your Password is the same  Please continue all other medications as before, and refills have been done if requested.  Please have the pharmacy call with any other refills you may need.  Please continue your efforts at being more active, low cholesterol diet, and weight control.  You are otherwise up to date with prevention measures today.  Please keep your appointments with your specialists as you may have planned  Please make an Appointment to return in 6 months, or sooner if needed

## 2023-03-16 NOTE — Assessment & Plan Note (Signed)
Lab Results  Component Value Date   LDLCALC 51 03/12/2023   Stable, pt to continue current statin crestor 10 qd

## 2023-03-16 NOTE — Assessment & Plan Note (Signed)
BP Readings from Last 3 Encounters:  03/16/23 128/76  02/16/23 108/72  10/22/22 134/72   Stable, pt to continue medical treatment hyzaar, 100 25 mg qd

## 2023-03-16 NOTE — Progress Notes (Signed)
Patient ID: Carl Trujillo, male   DOB: 05-Mar-1951, 72 y.o.   MRN: [id number]        Chief Complaint: follow up low b12, hld, htn, hyperglycemia, low vit d       HPI:  Carl Trujillo is a 72 y.o. male here overall doing ok; Pt denies chest pain, increased sob or doe, wheezing, orthopnea, PND, increased LE swelling, palpitations, dizziness or syncope.   Pt denies polydipsia, polyuria, or new focal neuro s/s.    Pt denies fever, wt loss, night sweats, loss of appetite, or other constitutional symptoms         Wt Readings from Last 3 Encounters:  03/16/23 163 lb (73.9 kg)  02/16/23 165 lb 6.4 oz (75 kg)  10/22/22 164 lb 8 oz (74.6 kg)   BP Readings from Last 3 Encounters:  03/16/23 128/76  02/16/23 108/72  10/22/22 134/72         Past Medical History:  Diagnosis Date   Agatston coronary artery calcium score greater than 400 08/04/2022   Coronary Calcium Score 07/11/2022: Total Calcium Score 574, LAD 493, LCx 25, RCA 56.    Anxiety    Arthritis of right knee    Arthritis of spine    ED (erectile dysfunction)    Hyperlipidemia 05/2022   Recently diagnosed: Added on rosuvastatin and omega-3 fatty acids   Hypertension    History reviewed. No pertinent surgical history.  reports that he quit smoking about 25 years ago. His smoking use included cigars. He has never used smokeless tobacco. He reports that he does not currently use alcohol. He reports that he does not use drugs. family history includes CVA in his sister; Cancer in his mother and another family member; Coronary artery disease in an other family member; Post-traumatic stress disorder in his brother. No Known Allergies Current Outpatient Medications on File Prior to Visit  Medication Sig Dispense Refill   alprazolam (XANAX) 2 MG tablet TAKE 1/2 TO 1 (ONE-HALF TO ONE) TABLET BY MOUTH THREE TIMES DAILY AS NEEDED FOR ANXIETY AND  FOR  INSOMNIA 80 tablet 3   Ascorbic Acid (VITAMIN C) 1000 MG tablet Take 5,000 mg by mouth daily.       aspirin 81 MG tablet Take 81 mg by mouth daily.     Cascara Sagrada 450 MG CAPS Take by mouth.     cholecalciferol (VITAMIN D) 1000 units tablet Take 1,000 Units by mouth daily.     Cod Liver Oil 1000 MG CAPS Take by mouth.     cyclobenzaprine (FLEXERIL) 10 MG tablet Take 1 tablet by mouth twice daily as needed 180 tablet 0   diclofenac (VOLTAREN) 75 MG EC tablet Take 1 tablet (75 mg total) by mouth 2 (two) times daily as needed. 60 tablet 2   losartan-hydrochlorothiazide (HYZAAR) 100-25 MG tablet Take 1 tablet by mouth once daily 90 tablet 3   Magnesium-Zinc 133.33-5 MG TABS Take by mouth.     Omega-3 Fatty Acids (OMEGA 3 PO) Take by mouth. Omega XL - 2 capsules daily     pyridoxine (B-6) 100 MG tablet Take 100 mg by mouth daily.     rosuvastatin (CRESTOR) 10 MG tablet Take 1 tablet (10 mg total) by mouth daily. 90 tablet 3   Specialty Vitamins Products (CENTRUM PERFORMANCE) TABS Take by mouth.     traZODone (DESYREL) 100 MG tablet Take 1 tablet (100 mg total) by mouth at bedtime. 90 tablet 1   vitamin B-12 (CYANOCOBALAMIN) 1000 MCG tablet Take  1,000 mcg by mouth daily.     No current facility-administered medications on file prior to visit.        ROS:  All others reviewed and negative.  Objective        PE:  BP 128/76 (BP Location: Right Arm, Patient Position: Sitting, Cuff Size: Normal)   Pulse 65   Temp 97.7 F (36.5 C) (Oral)   Ht 5\' 10"  (1.778 m)   Wt 163 lb (73.9 kg)   SpO2 97%   BMI 23.39 kg/m                 Constitutional: Pt appears in NAD               HENT: Head: NCAT.                Right Ear: External ear normal.                 Left Ear: External ear normal.                Eyes: . Pupils are equal, round, and reactive to light. Conjunctivae and EOM are normal               Nose: without d/c or deformity               Neck: Neck supple. Gross normal ROM               Cardiovascular: Normal rate and regular rhythm.                 Pulmonary/Chest: Effort normal  and breath sounds without rales or wheezing.                Abd:  Soft, NT, ND, + BS, no organomegaly               Neurological: Pt is alert. At baseline orientation, motor grossly intact               Skin: Skin is warm. No rashes, no other new lesions, LE edema - none               Psychiatric: Pt behavior is normal without agitation   Micro: none  Cardiac tracings I have personally interpreted today:  none  Pertinent Radiological findings (summarize): none   Lab Results  Component Value Date   WBC 3.1 (L) 03/12/2023   HGB 13.8 03/12/2023   HCT 41.5 03/12/2023   PLT 217.0 03/12/2023   GLUCOSE 92 03/12/2023   CHOL 107 03/12/2023   TRIG 57.0 03/12/2023   HDL 44.80 03/12/2023   LDLCALC 51 03/12/2023   ALT 26 03/12/2023   AST 26 03/12/2023   NA 141 03/12/2023   K 4.1 03/12/2023   CL 102 03/12/2023   CREATININE 1.14 03/12/2023   BUN 17 03/12/2023   CO2 31 03/12/2023   TSH 4.58 03/12/2023   PSA 1.11 03/12/2023   HGBA1C 5.6 03/12/2023   Assessment/Plan:  Carl Trujillo is a 72 y.o. Black or African American [2] male with  has a past medical history of Agatston coronary artery calcium score greater than 400 (08/04/2022), Anxiety, Arthritis of right knee, Arthritis of spine, ED (erectile dysfunction), Hyperlipidemia (05/2022), and Hypertension.  Mixed hyperlipidemia Lab Results  Component Value Date   LDLCALC 51 03/12/2023   Stable, pt to continue current statin crestor 10 qd   Essential hypertension BP Readings from Last 3 Encounters:  03/16/23 128/76  02/16/23 108/72  10/22/22 134/72   Stable, pt to continue medical treatment hyzaar, 100 25 mg qd   Hyperglycemia Lab Results  Component Value Date   HGBA1C 5.6 03/12/2023   Stable, pt to continue current medical treatment  - diet, wt control   Vitamin D deficiency Last vitamin D Lab Results  Component Value Date   VD25OH 51.95 03/12/2023   Stable, cont oral replacement   B12 deficiency Lab Results   Component Value Date   VITAMINB12 1,330 (H) 03/12/2023   Now overcontrolled, pt to decrease oral replacement - b12 1000 mcg to mon - wed -fri  Followup: Return in about 6 months (around 09/15/2023).  Carl Barre, MD 03/16/2023 7:26 PM [facility]

## 2023-03-16 NOTE — Assessment & Plan Note (Signed)
Lab Results  Component Value Date   VITAMINB12 1,330 (H) 03/12/2023   Now overcontrolled, pt to decrease oral replacement - b12 1000 mcg to mon - wed -fri

## 2023-05-20 ENCOUNTER — Encounter: Payer: Self-pay | Admitting: Psychiatry

## 2023-05-20 ENCOUNTER — Ambulatory Visit (INDEPENDENT_AMBULATORY_CARE_PROVIDER_SITE_OTHER): Payer: Medicare HMO | Admitting: Psychiatry

## 2023-05-20 DIAGNOSIS — F411 Generalized anxiety disorder: Secondary | ICD-10-CM | POA: Diagnosis not present

## 2023-05-20 DIAGNOSIS — F5101 Primary insomnia: Secondary | ICD-10-CM

## 2023-05-20 MED ORDER — TRAZODONE HCL 100 MG PO TABS
100.0000 mg | ORAL_TABLET | Freq: Every day | ORAL | 1 refills | Status: DC
Start: 2023-05-20 — End: 2023-11-18

## 2023-05-20 MED ORDER — ALPRAZOLAM 2 MG PO TABS
ORAL_TABLET | ORAL | 5 refills | Status: DC
Start: 2023-05-20 — End: 2023-11-18

## 2023-05-20 NOTE — Progress Notes (Signed)
Carl Trujillo [id number] Jun 18, 1951 72 y.o.  Subjective:   Patient ID:  Carl Trujillo is a 72 y.o. (DOB May 04, 1951) male.  Chief Complaint:  Chief Complaint  Patient presents with   Follow-up    Anxiety    HPI Carl Trujillo presents to the office today for follow-up of anxiety. He reports that his brother had surgery in late February and he went to live with his brother to help care for him and take him to follow-up appointments. He reports that he just moved back into his home a few weeks ago. He is concerned that his brother is not taking care of himself. He reports that he is 1 of only 3 siblings still living. He reports that he is focused on maintaining his health as much as possible. He reports some worry "about this country" and possible war. He reports that rest and sleep is most helpful for his anxiety. He reports enjoying time alone in his home. Denies depressed mood. He reports taking a 1/4 tab of Xanax prn when he experiences increased anxiety. Energy and motivation have been good. Adequate concentration.   Xanax last filled 04/13/23.    AUDIT    Flowsheet Row Clinical Support from 05/09/2022 in [facility]  Alcohol Use Disorder Identification Test Final Score (AUDIT) 3      Mini-Mental    Flowsheet Row Clinical Support from 03/17/2018 in [facility]  Total Score (max 30 points ) 29      PHQ2-9    Flowsheet Row Office Visit from 03/16/2023 in [facility] Visit from 10/22/2022 in [facility] [place] Office Visit from 07/01/2022 in [facility] Clinical Support from 05/09/2022 in [facility] at [facility] Office Visit from 12/26/2021 in [facility] at [facility]  PHQ-2 Total Score 0 1 0 0 0  PHQ-9 Total Score -- 0 0 -- --      Flowsheet Row ED from 07/29/2021 in [facility] ED from 07/11/2021 in [facility] Emergency Department at [facility]  C-SSRS RISK CATEGORY Error: Question 6 not populated No Risk        Review of Systems:  Review of Systems  Musculoskeletal:  Negative for gait problem.  Neurological:  Negative for weakness.  Psychiatric/Behavioral:         Please refer to HPI    Medications: I have reviewed the patient's current medications.  Current Outpatient Medications  Medication Sig Dispense Refill   alprazolam (XANAX) 2 MG tablet TAKE 1/2 TO 1 (ONE-HALF TO ONE) TABLET BY MOUTH THREE TIMES DAILY AS NEEDED FOR ANXIETY AND  FOR  INSOMNIA 80 tablet 5   Ascorbic Acid (VITAMIN C) 1000 MG tablet Take 5,000 mg by mouth daily.      aspirin 81 MG tablet Take 81 mg by mouth daily.     Cascara Sagrada 450 MG CAPS Take by mouth.     cholecalciferol (VITAMIN D) 1000 units tablet Take 1,000 Units by mouth daily.     Cod Liver Oil 1000 MG CAPS Take by mouth.     cyclobenzaprine (FLEXERIL) 10 MG tablet Take 1 tablet by mouth twice daily as needed 180 tablet 0   diclofenac (VOLTAREN) 75 MG EC tablet Take 1 tablet (75 mg total) by mouth 2 (two) times daily as needed. 60 tablet 2   losartan-hydrochlorothiazide (HYZAAR)  100-25 MG tablet Take 1 tablet by mouth once daily 90 tablet 3   Magnesium-Zinc 133.33-5 MG TABS Take by mouth.     Omega-3 Fatty Acids (OMEGA 3 PO) Take by mouth. Omega XL - 2 capsules daily     pyridoxine (B-6) 100 MG tablet Take 100 mg by mouth daily.     rosuvastatin (CRESTOR) 10 MG tablet Take 1 tablet (10 mg total) by mouth daily. 90 tablet 3   Specialty Vitamins Products (CENTRUM PERFORMANCE) TABS Take by mouth.     traZODone (DESYREL) 100 MG tablet Take 1 tablet (100 mg total) by mouth at bedtime. 90 tablet 1   vitamin B-12 (CYANOCOBALAMIN) 1000 MCG tablet Take 1,000 mcg by mouth daily.     No current facility-administered medications for this visit.    Medication Side Effects: None  Allergies: No  Known Allergies  Past Medical History:  Diagnosis Date   Agatston coronary artery calcium score greater than 400 08/04/2022   Coronary Calcium Score 07/11/2022: Total Calcium Score 574, LAD 493, LCx 25, RCA 56.    Anxiety    Arthritis of right knee    Arthritis of spine    ED (erectile dysfunction)    Hyperlipidemia 05/2022   Recently diagnosed: Added on rosuvastatin and omega-3 fatty acids   Hypertension     Past Medical History, Surgical history, Social history, and Family history were reviewed and updated as appropriate.   Please see review of systems for further details on the patient's review from today.   Objective:   Physical Exam:  There were no vitals taken for this visit.  Physical Exam Constitutional:      General: He is not in acute distress. Musculoskeletal:        General: No deformity.  Neurological:     Mental Status: He is alert and oriented to person, place, and time.     Coordination: Coordination normal.  Psychiatric:        Attention and Perception: Attention and perception normal. He does not perceive auditory or visual hallucinations.        Mood and Affect: Mood is anxious. Mood is not depressed. Affect is not labile, blunt, angry or inappropriate.        Speech: Speech normal.        Behavior: Behavior normal.        Thought Content: Thought content normal. Thought content is not paranoid or delusional. Thought content does not include homicidal or suicidal ideation. Thought content does not include homicidal or suicidal plan.        Cognition and Memory: Cognition and memory normal.        Judgment: Judgment normal.     Comments: Insight intact     Lab Review:     Component Value Date/Time   NA 141 03/12/2023 1114   K 4.1 03/12/2023 1114   CL 102 03/12/2023 1114   CO2 31 03/12/2023 1114   GLUCOSE 92 03/12/2023 1114   BUN 17 03/12/2023 1114   CREATININE 1.14 03/12/2023 1114   CALCIUM 8.8 03/12/2023 1114   PROT 6.9 03/12/2023 1114    ALBUMIN 4.4 03/12/2023 1114   AST 26 03/12/2023 1114   ALT 26 03/12/2023 1114   ALKPHOS 79 03/12/2023 1114   BILITOT 0.8 03/12/2023 1114   GFRNONAA 88.07 11/14/2009 1036       Component Value Date/Time   WBC 3.1 (L) 03/12/2023 1114   RBC 4.55 03/12/2023 1114   HGB 13.8 03/12/2023 1114   HCT  41.5 03/12/2023 1114   PLT 217.0 03/12/2023 1114   MCV 91.2 03/12/2023 1114   MCHC 33.1 03/12/2023 1114   RDW 13.2 03/12/2023 1114   LYMPHSABS 1.1 03/12/2023 1114   MONOABS 0.4 03/12/2023 1114   EOSABS 0.1 03/12/2023 1114   BASOSABS 0.0 03/12/2023 1114    No results found for: "POCLITH", "LITHIUM"   No results found for: "PHENYTOIN", "PHENOBARB", "VALPROATE", "CBMZ"   .res Assessment: Plan:    29 minutes spent dedicated to the care of this patient on the date of this encounter to include pre-visit review of records, ordering of medication, post visit documentation, and face-to-face time with the patient discussing the effect recent stressors have had on his anxiety. Will continue Alprazolam 2 mg 1/2-1 tablet up to three times daily as needed for anxiety or insomnia.  Will continue Trazodone 100 mg at bedtime for insomnia.  Pt to follow-up in 6 months or sooner if clinically indicated.  Patient advised to contact office with any questions, adverse effects, or acute worsening in signs and symptoms.   Carl Trujillo was seen today for follow-up.  Diagnoses and all orders for this visit:  Anxiety state -     alprazolam (XANAX) 2 MG tablet; TAKE 1/2 TO 1 (ONE-HALF TO ONE) TABLET BY MOUTH THREE TIMES DAILY AS NEEDED FOR ANXIETY AND  FOR  INSOMNIA  Primary insomnia -     traZODone (DESYREL) 100 MG tablet; Take 1 tablet (100 mg total) by mouth at bedtime.     Please see After Visit Summary for patient specific instructions.  Future Appointments  Date Time Provider Department Center  11/18/2023 11:00 AM Corie Chiquito, PMHNP [facility] None    No orders of the defined types were placed in this  encounter.   -------------------------------

## 2023-06-30 ENCOUNTER — Other Ambulatory Visit: Payer: Self-pay | Admitting: Internal Medicine

## 2023-06-30 ENCOUNTER — Other Ambulatory Visit: Payer: Self-pay

## 2023-07-03 ENCOUNTER — Other Ambulatory Visit: Payer: Self-pay | Admitting: Internal Medicine

## 2023-07-03 ENCOUNTER — Other Ambulatory Visit: Payer: Self-pay

## 2023-08-26 ENCOUNTER — Encounter: Payer: Self-pay | Admitting: Psychiatry

## 2023-09-08 ENCOUNTER — Encounter: Payer: Self-pay | Admitting: Internal Medicine

## 2023-09-18 ENCOUNTER — Ambulatory Visit: Payer: Medicare HMO | Admitting: Internal Medicine

## 2023-09-25 ENCOUNTER — Ambulatory Visit (INDEPENDENT_AMBULATORY_CARE_PROVIDER_SITE_OTHER): Payer: Medicare HMO | Admitting: Internal Medicine

## 2023-09-25 ENCOUNTER — Encounter: Payer: Self-pay | Admitting: Internal Medicine

## 2023-09-25 VITALS — BP 136/82 | HR 66 | Temp 98.0°F | Ht 70.0 in | Wt 162.0 lb

## 2023-09-25 DIAGNOSIS — F411 Generalized anxiety disorder: Secondary | ICD-10-CM | POA: Diagnosis not present

## 2023-09-25 DIAGNOSIS — E559 Vitamin D deficiency, unspecified: Secondary | ICD-10-CM

## 2023-09-25 DIAGNOSIS — E782 Mixed hyperlipidemia: Secondary | ICD-10-CM | POA: Diagnosis not present

## 2023-09-25 DIAGNOSIS — I10 Essential (primary) hypertension: Secondary | ICD-10-CM | POA: Diagnosis not present

## 2023-09-25 DIAGNOSIS — R739 Hyperglycemia, unspecified: Secondary | ICD-10-CM

## 2023-09-25 DIAGNOSIS — E538 Deficiency of other specified B group vitamins: Secondary | ICD-10-CM

## 2023-09-25 NOTE — Progress Notes (Unsigned)
Patient ID: Aesop Millhouse, male   DOB: [birth year], 72 y.o.   MRN: [id number]        Chief Complaint: follow up low vit d, hyperglycemia, htn, low b12, hld       HPI:  Lavance Bratz is a 72 y.o. male here overall doing well.  Pt denies chest pain, increased sob or doe, wheezing, orthopnea, PND, increased LE swelling, palpitations, dizziness or syncope.   Pt denies polydipsia, polyuria, or new focal neuro s/s.    Pt denies fever, wt loss, night sweats, loss of appetite, or other constitutional symptoms   Had covid and flu shot last wk.    Has been working on wt loss with walking every day.  Has not required CPAP since 2018 after wt loss per pulmonary.   Sees psychiatry on regular basis Wt Readings from Last 3 Encounters:  09/25/23 162 lb (73.5 kg)  03/16/23 163 lb (73.9 kg)  02/16/23 165 lb 6.4 oz (75 kg)   BP Readings from Last 3 Encounters:  09/25/23 136/82  03/16/23 128/76  02/16/23 108/72         Past Medical History:  Diagnosis Date   Agatston coronary artery calcium score greater than 400 08/04/2022   Coronary Calcium Score 07/11/2022: Total Calcium Score 574, LAD 493, LCx 25, RCA 56.    Anxiety    Arthritis of right knee    Arthritis of spine    ED (erectile dysfunction)    Hyperlipidemia 05/2022   Recently diagnosed: Added on rosuvastatin and omega-3 fatty acids   Hypertension    History reviewed. No pertinent surgical history.  reports that he quit smoking about 25 years ago. His smoking use included cigars. He has never used smokeless tobacco. He reports that he does not currently use alcohol. He reports that he does not use drugs. family history includes CVA in his sister; Cancer in his mother and another family member; Coronary artery disease in an other family member; Post-traumatic stress disorder in his brother. No Known Allergies Current Outpatient Medications on File Prior to Visit  Medication Sig Dispense Refill   alprazolam (XANAX) 2 MG tablet TAKE 1/2 TO 1 (ONE-HALF  TO ONE) TABLET BY MOUTH THREE TIMES DAILY AS NEEDED FOR ANXIETY AND  FOR  INSOMNIA 80 tablet 5   Ascorbic Acid (VITAMIN C) 1000 MG tablet Take 5,000 mg by mouth daily.      aspirin 81 MG tablet Take 81 mg by mouth daily.     Cascara Sagrada 450 MG CAPS Take by mouth.     cholecalciferol (VITAMIN D) 1000 units tablet Take 1,000 Units by mouth daily.     Cod Liver Oil 1000 MG CAPS Take by mouth.     cyclobenzaprine (FLEXERIL) 10 MG tablet Take 1 tablet by mouth twice daily as needed 180 tablet 0   diclofenac (VOLTAREN) 75 MG EC tablet Take 1 tablet by mouth twice daily as needed 60 tablet 2   losartan-hydrochlorothiazide (HYZAAR) 100-25 MG tablet Take 1 tablet by mouth once daily 90 tablet 3   Magnesium-Zinc 133.33-5 MG TABS Take by mouth.     Omega-3 Fatty Acids (OMEGA 3 PO) Take by mouth. Omega XL - 2 capsules daily     pyridoxine (B-6) 100 MG tablet Take 100 mg by mouth daily.     rosuvastatin (CRESTOR) 10 MG tablet Take 1 tablet by mouth once daily 90 tablet 3   Specialty Vitamins Products (CENTRUM PERFORMANCE) TABS Take by mouth.     traZODone (DESYREL)  100 MG tablet Take 1 tablet (100 mg total) by mouth at bedtime. 90 tablet 1   vitamin B-12 (CYANOCOBALAMIN) 1000 MCG tablet Take 1,000 mcg by mouth daily.     No current facility-administered medications on file prior to visit.        ROS:  All others reviewed and negative.  Objective        PE:  BP 136/82 (BP Location: Left Arm, Patient Position: Sitting, Cuff Size: Normal)   Pulse 66   Temp 98 F (36.7 C) (Oral)   Ht 5\' 10"  (1.778 m)   Wt 162 lb (73.5 kg)   SpO2 94%   BMI 23.24 kg/m                 Constitutional: Pt appears in NAD               HENT: Head: NCAT.                Right Ear: External ear normal.                 Left Ear: External ear normal.                Eyes: . Pupils are equal, round, and reactive to light. Conjunctivae and EOM are normal               Nose: without d/c or deformity               Neck:  Neck supple. Gross normal ROM               Cardiovascular: Normal rate and regular rhythm.                 Pulmonary/Chest: Effort normal and breath sounds without rales or wheezing.                Abd:  Soft, NT, ND, + BS, no organomegaly               Neurological: Pt is alert. At baseline orientation, motor grossly intact               Skin: Skin is warm. No rashes, no other new lesions, LE edema - none               Psychiatric: Pt behavior is normal without agitation   Micro: none  Cardiac tracings I have personally interpreted today:  none  Pertinent Radiological findings (summarize): none   Lab Results  Component Value Date   WBC 3.1 (L) 03/12/2023   HGB 13.8 03/12/2023   HCT 41.5 03/12/2023   PLT 217.0 03/12/2023   GLUCOSE 92 03/12/2023   CHOL 107 03/12/2023   TRIG 57.0 03/12/2023   HDL 44.80 03/12/2023   LDLCALC 51 03/12/2023   ALT 26 03/12/2023   AST 26 03/12/2023   NA 141 03/12/2023   K 4.1 03/12/2023   CL 102 03/12/2023   CREATININE 1.14 03/12/2023   BUN 17 03/12/2023   CO2 31 03/12/2023   TSH 4.58 03/12/2023   PSA 1.11 03/12/2023   HGBA1C 5.6 03/12/2023   Assessment/Plan:  Reiner Robicheau is a 72 y.o. Black or African American [2] male with  has a past medical history of Agatston coronary artery calcium score greater than 400 (08/04/2022), Anxiety, Arthritis of right knee, Arthritis of spine, ED (erectile dysfunction), Hyperlipidemia (05/2022), and Hypertension.  Vitamin D deficiency Last vitamin D Lab Results  Component Value Date   VD25OH  51.95 03/12/2023   Stable, cont oral replacement   Mixed hyperlipidemia Lab Results  Component Value Date   LDLCALC 51 03/12/2023   Stable, pt to continue current statin crestor 10 mg qd   Hyperglycemia Lab Results  Component Value Date   HGBA1C 5.6 03/12/2023   Stable, pt to continue current medical treatment  - diet, wt control   Essential hypertension BP Readings from Last 3 Encounters:  09/25/23  136/82  03/16/23 128/76  02/16/23 108/72   Stable, pt to continue medical treatment hyzaar 100 - 25 qd   B12 deficiency Lab Results  Component Value Date   VITAMINB12 1,330 (H) 03/12/2023   Stable, cont oral replacement - b12 1000 mcg qd   Generalized anxiety disorder Cont f/u with psychiatry  Followup: Return in about 6 months (around 03/25/2024).  Oliver Barre, MD 09/27/2023 7:32 PM [facility]

## 2023-09-25 NOTE — Patient Instructions (Signed)
Please continue all other medications as before, and refills have been done if requested.  Please have the pharmacy call with any other refills you may need.  Please continue your efforts at being more active, low cholesterol diet, and weight control.  Please keep your appointments with your specialists as you may have planned  Please make an Appointment to return in 6 months, or sooner if needed 

## 2023-09-27 ENCOUNTER — Encounter: Payer: Self-pay | Admitting: Internal Medicine

## 2023-09-27 NOTE — Assessment & Plan Note (Signed)
Last vitamin D Lab Results  Component Value Date   VD25OH 51.95 03/12/2023   Stable, cont oral replacement

## 2023-09-27 NOTE — Assessment & Plan Note (Signed)
BP Readings from Last 3 Encounters:  09/25/23 136/82  03/16/23 128/76  02/16/23 108/72   Stable, pt to continue medical treatment hyzaar 100 - 25 qd

## 2023-09-27 NOTE — Assessment & Plan Note (Signed)
Lab Results  Component Value Date   LDLCALC 51 03/12/2023   Stable, pt to continue current statin crestor 10 mg qd

## 2023-09-27 NOTE — Assessment & Plan Note (Signed)
Cont f/u with psychiatry

## 2023-09-27 NOTE — Assessment & Plan Note (Signed)
Lab Results  Component Value Date   HGBA1C 5.6 03/12/2023   Stable, pt to continue current medical treatment  - diet, wt control

## 2023-09-27 NOTE — Assessment & Plan Note (Signed)
Lab Results  Component Value Date   VITAMINB12 1,330 (H) 03/12/2023   Stable, cont oral replacement - b12 1000 mcg qd

## 2023-10-23 ENCOUNTER — Ambulatory Visit: Payer: Medicare HMO | Admitting: Internal Medicine

## 2023-11-18 ENCOUNTER — Encounter: Payer: Self-pay | Admitting: Physician Assistant

## 2023-11-18 ENCOUNTER — Ambulatory Visit: Payer: Medicare HMO | Admitting: Physician Assistant

## 2023-11-18 ENCOUNTER — Ambulatory Visit: Payer: Medicare HMO | Admitting: Psychiatry

## 2023-11-18 DIAGNOSIS — F411 Generalized anxiety disorder: Secondary | ICD-10-CM | POA: Diagnosis not present

## 2023-11-18 DIAGNOSIS — F5101 Primary insomnia: Secondary | ICD-10-CM | POA: Diagnosis not present

## 2023-11-18 MED ORDER — TRAZODONE HCL 100 MG PO TABS
100.0000 mg | ORAL_TABLET | Freq: Every day | ORAL | 1 refills | Status: DC
Start: 2023-11-18 — End: 2024-04-06

## 2023-11-18 MED ORDER — ALPRAZOLAM 2 MG PO TABS
ORAL_TABLET | ORAL | 5 refills | Status: DC
Start: 2023-11-18 — End: 2024-05-17

## 2023-11-18 NOTE — Progress Notes (Signed)
 [facility]  Patient ID: Carl Trujillo,  MRN: [id number]  PCP: Norleen Lynwood ORN, MD  Date of Evaluation: 11/18/2023 Time spent:20 minutes  Chief Complaint:  Chief Complaint   Anxiety; Insomnia; Follow-up    HISTORY/CURRENT STATUS: HPI Transferring to my care from Harlene Pepper, NP who is no longer with our practice.   Feels like his meds are working well. Has been on Trazodone  and Xanax  for years for anxiety and sleep. Is able to go to sleep and stay asleep.  Denies panic attacks but does get overwhelmed a lot of times and the Xanax  is helpful.  Patient is able to enjoy things.  Energy and motivation are good.  He is retired.  He enjoys walking.  Also reads a lot and keeps up with politics.  No extreme sadness, tearfulness, or feelings of hopelessness.   ADLs and personal hygiene are normal.   Denies any changes in concentration, making decisions, or remembering things.  Appetite has not changed.  Weight is stable.  Denies suicidal or homicidal thoughts.  Patient denies increased energy with decreased need for sleep, increased talkativeness, racing thoughts, impulsivity or risky behaviors, increased spending, increased libido, grandiosity, increased irritability or anger, paranoia, or hallucinations.  Denies dizziness, syncope, seizures, numbness, tingling, tremor, tics, unsteady gait, slurred speech, confusion. Denies muscle or joint pain, stiffness, or dystonia.  Individual Medical History/ Review of Systems: Changes? :No   Past medications for mental health diagnoses include: Xanax , trazodone   Allergies: Patient has no known allergies.  Current Medications:  Current Outpatient Medications:    Ascorbic Acid (VITAMIN C) 1000 MG tablet, Take 5,000 mg by mouth daily. , Disp: , Rfl:    aspirin 81 MG tablet, Take 81 mg by mouth daily., Disp: , Rfl:    Cascara Sagrada 450 MG CAPS, Take by mouth., Disp: , Rfl:    cholecalciferol (VITAMIN D ) 1000 units tablet, Take 1,000 Units  by mouth daily., Disp: , Rfl:    Cod Liver Oil 1000 MG CAPS, Take by mouth., Disp: , Rfl:    cyclobenzaprine  (FLEXERIL ) 10 MG tablet, Take 1 tablet by mouth twice daily as needed, Disp: 180 tablet, Rfl: 0   diclofenac  (VOLTAREN ) 75 MG EC tablet, Take 1 tablet by mouth twice daily as needed, Disp: 60 tablet, Rfl: 2   losartan -hydrochlorothiazide (HYZAAR) 100-25 MG tablet, Take 1 tablet by mouth once daily, Disp: 90 tablet, Rfl: 3   Magnesium-Zinc 133.33-5 MG TABS, Take by mouth., Disp: , Rfl:    Omega-3 Fatty Acids (OMEGA 3 PO), Take by mouth. Omega XL - 2 capsules daily, Disp: , Rfl:    pyridoxine (B-6) 100 MG tablet, Take 100 mg by mouth daily., Disp: , Rfl:    rosuvastatin  (CRESTOR ) 10 MG tablet, Take 1 tablet by mouth once daily, Disp: 90 tablet, Rfl: 3   Specialty Vitamins Products (CENTRUM PERFORMANCE) TABS, Take by mouth., Disp: , Rfl:    vitamin B-12 (CYANOCOBALAMIN ) 1000 MCG tablet, Take 1,000 mcg by mouth daily., Disp: , Rfl:    alprazolam  (XANAX ) 2 MG tablet, TAKE 1/2 TO 1 (ONE-HALF TO ONE) TABLET BY MOUTH THREE TIMES DAILY AS NEEDED FOR ANXIETY AND  FOR  INSOMNIA, Disp: 80 tablet, Rfl: 5   traZODone  (DESYREL ) 100 MG tablet, Take 1 tablet (100 mg total) by mouth at bedtime., Disp: 90 tablet, Rfl: 1 Medication Side Effects: none  Family Medical/ Social History: Changes? No  MENTAL HEALTH EXAM:  There were no vitals taken for this visit.There is no height or weight  on file to calculate BMI.  General Appearance: Casual and Well Groomed  Eye Contact:  Good  Speech:  Clear and Coherent and Normal Rate  Volume:  Normal  Mood:  Euthymic  Affect:  Congruent  Thought Process:  Goal Directed and Descriptions of Associations: Circumstantial  Orientation:  Full (Time, Place, and Person)  Thought Content: Logical   Suicidal Thoughts:  No  Homicidal Thoughts:  No  Memory:  WNL  Judgement:  Good  Insight:  Good  Psychomotor Activity:  Normal  Concentration:  Concentration: Good   Recall:  Good  Fund of Knowledge: Good  Language: Good  Assets:  Desire for Improvement Financial Resources/Insurance Housing Transportation  ADL's:  Intact  Cognition: WNL  Prognosis:  Good   DIAGNOSES:    ICD-10-CM   1. Anxiety state  F41.1 alprazolam  (XANAX ) 2 MG tablet    2. Primary insomnia  F51.01 traZODone  (DESYREL ) 100 MG tablet      Receiving Psychotherapy: No   RECOMMENDATIONS:   PDMP reviewed.  Xanax  filled 10/17/2023. I provided 20 minutes of face to face time during this encounter, including time spent before and after the visit in records review, medical decision making, counseling pertinent to today's visit, and charting.   He is doing well on the current treatment plan so no changes will be made.  Doses will remain the same as when he was seeing Harlene Pepper, NP.  Continue Xanax  2 mg, 1/2-1 p.o. 3 times daily as needed anxiety or sleep. Continue trazodone  100 mg, 1 p.o. nightly. Return in 6 months.  Verneita Cooks, PA-C

## 2023-12-14 ENCOUNTER — Other Ambulatory Visit: Payer: Self-pay

## 2023-12-14 ENCOUNTER — Other Ambulatory Visit: Payer: Self-pay | Admitting: Internal Medicine

## 2023-12-14 DIAGNOSIS — I10 Essential (primary) hypertension: Secondary | ICD-10-CM

## 2023-12-14 DIAGNOSIS — G8929 Other chronic pain: Secondary | ICD-10-CM

## 2024-01-07 DIAGNOSIS — N5201 Erectile dysfunction due to arterial insufficiency: Secondary | ICD-10-CM | POA: Diagnosis not present

## 2024-01-07 DIAGNOSIS — N401 Enlarged prostate with lower urinary tract symptoms: Secondary | ICD-10-CM | POA: Diagnosis not present

## 2024-01-07 DIAGNOSIS — R3912 Poor urinary stream: Secondary | ICD-10-CM | POA: Diagnosis not present

## 2024-01-07 DIAGNOSIS — Z125 Encounter for screening for malignant neoplasm of prostate: Secondary | ICD-10-CM | POA: Diagnosis not present

## 2024-01-11 ENCOUNTER — Other Ambulatory Visit: Payer: Self-pay

## 2024-01-11 ENCOUNTER — Other Ambulatory Visit: Payer: Self-pay | Admitting: Internal Medicine

## 2024-02-05 ENCOUNTER — Other Ambulatory Visit: Payer: Self-pay | Admitting: Internal Medicine

## 2024-02-05 ENCOUNTER — Other Ambulatory Visit: Payer: Self-pay

## 2024-03-03 ENCOUNTER — Other Ambulatory Visit: Payer: Self-pay

## 2024-03-03 ENCOUNTER — Other Ambulatory Visit: Payer: Self-pay | Admitting: Internal Medicine

## 2024-03-08 ENCOUNTER — Other Ambulatory Visit: Payer: Self-pay | Admitting: Internal Medicine

## 2024-03-08 ENCOUNTER — Other Ambulatory Visit: Payer: Self-pay

## 2024-03-08 DIAGNOSIS — I10 Essential (primary) hypertension: Secondary | ICD-10-CM

## 2024-04-05 ENCOUNTER — Other Ambulatory Visit: Payer: Self-pay | Admitting: Internal Medicine

## 2024-04-05 ENCOUNTER — Other Ambulatory Visit: Payer: Self-pay

## 2024-04-06 ENCOUNTER — Ambulatory Visit: Payer: Self-pay | Admitting: Internal Medicine

## 2024-04-06 ENCOUNTER — Encounter: Payer: Self-pay | Admitting: Internal Medicine

## 2024-04-06 ENCOUNTER — Ambulatory Visit (INDEPENDENT_AMBULATORY_CARE_PROVIDER_SITE_OTHER): Admitting: Internal Medicine

## 2024-04-06 VITALS — BP 138/80 | HR 79 | Temp 98.0°F | Ht 70.0 in | Wt 164.0 lb

## 2024-04-06 DIAGNOSIS — M5442 Lumbago with sciatica, left side: Secondary | ICD-10-CM

## 2024-04-06 DIAGNOSIS — I10 Essential (primary) hypertension: Secondary | ICD-10-CM | POA: Diagnosis not present

## 2024-04-06 DIAGNOSIS — E538 Deficiency of other specified B group vitamins: Secondary | ICD-10-CM

## 2024-04-06 DIAGNOSIS — Z125 Encounter for screening for malignant neoplasm of prostate: Secondary | ICD-10-CM | POA: Diagnosis not present

## 2024-04-06 DIAGNOSIS — E559 Vitamin D deficiency, unspecified: Secondary | ICD-10-CM

## 2024-04-06 DIAGNOSIS — Z0001 Encounter for general adult medical examination with abnormal findings: Secondary | ICD-10-CM

## 2024-04-06 DIAGNOSIS — E782 Mixed hyperlipidemia: Secondary | ICD-10-CM | POA: Diagnosis not present

## 2024-04-06 DIAGNOSIS — G8929 Other chronic pain: Secondary | ICD-10-CM

## 2024-04-06 DIAGNOSIS — F5101 Primary insomnia: Secondary | ICD-10-CM | POA: Diagnosis not present

## 2024-04-06 DIAGNOSIS — R739 Hyperglycemia, unspecified: Secondary | ICD-10-CM

## 2024-04-06 LAB — LIPID PANEL
Cholesterol: 104 mg/dL (ref 0–200)
HDL: 52.6 mg/dL (ref >39.00)
LDL Cholesterol: 42 mg/dL (ref 0–99)
NonHDL: 51.62
Total CHOL/HDL Ratio: 2
Triglycerides: 47 mg/dL (ref 0.0–149.0)
VLDL: 9.4 mg/dL (ref 0.0–40.0)

## 2024-04-06 LAB — CBC WITH DIFFERENTIAL/PLATELET
Basophils Absolute: 0 10*3/uL (ref 0.0–0.1)
Basophils Relative: 0.4 % (ref 0.0–3.0)
Eosinophils Absolute: 0.1 10*3/uL (ref 0.0–0.7)
Eosinophils Relative: 3.1 % (ref 0.0–5.0)
HCT: 38.7 % — ABNORMAL LOW (ref 39.0–52.0)
Hemoglobin: 13.2 g/dL (ref 13.0–17.0)
Lymphocytes Relative: 35.5 % (ref 12.0–46.0)
Lymphs Abs: 1.2 10*3/uL (ref 0.7–4.0)
MCHC: 34.1 g/dL (ref 30.0–36.0)
MCV: 91.7 fl (ref 78.0–100.0)
Monocytes Absolute: 0.4 10*3/uL (ref 0.1–1.0)
Monocytes Relative: 12.6 % — ABNORMAL HIGH (ref 3.0–12.0)
Neutro Abs: 1.6 10*3/uL (ref 1.4–7.7)
Neutrophils Relative %: 48.4 % (ref 43.0–77.0)
Platelets: 226 10*3/uL (ref 150.0–400.0)
RBC: 4.23 Mil/uL (ref 4.22–5.81)
RDW: 13.4 % (ref 11.5–15.5)
WBC: 3.3 10*3/uL — ABNORMAL LOW (ref 4.0–10.5)

## 2024-04-06 LAB — BASIC METABOLIC PANEL WITH GFR
BUN: 15 mg/dL (ref 6–23)
CO2: 32 meq/L (ref 19–32)
Calcium: 9.1 mg/dL (ref 8.4–10.5)
Chloride: 100 meq/L (ref 96–112)
Creatinine, Ser: 1.13 mg/dL (ref 0.40–1.50)
GFR: 64.46 mL/min (ref >60.00)
Glucose, Bld: 98 mg/dL (ref 70–99)
Potassium: 4.1 meq/L (ref 3.5–5.1)
Sodium: 138 meq/L (ref 135–145)

## 2024-04-06 LAB — URINALYSIS, ROUTINE W REFLEX MICROSCOPIC
Bilirubin Urine: NEGATIVE
Hgb urine dipstick: NEGATIVE
Ketones, ur: NEGATIVE
Leukocytes,Ua: NEGATIVE
Nitrite: NEGATIVE
RBC / HPF: NONE SEEN (ref 0–?)
Specific Gravity, Urine: 1.01 (ref 1.000–1.030)
Total Protein, Urine: NEGATIVE
Urine Glucose: NEGATIVE
Urobilinogen, UA: 0.2 (ref 0.0–1.0)
pH: 6 (ref 5.0–8.0)

## 2024-04-06 LAB — VITAMIN B12: Vitamin B-12: 1262 pg/mL — ABNORMAL HIGH (ref 211–911)

## 2024-04-06 LAB — HEPATIC FUNCTION PANEL
ALT: 29 U/L (ref 0–53)
AST: 32 U/L (ref 0–37)
Albumin: 4.3 g/dL (ref 3.5–5.2)
Alkaline Phosphatase: 59 U/L (ref 39–117)
Bilirubin, Direct: 0.2 mg/dL (ref 0.0–0.3)
Total Bilirubin: 1 mg/dL (ref 0.2–1.2)
Total Protein: 6.5 g/dL (ref 6.0–8.3)

## 2024-04-06 LAB — TSH: TSH: 2.51 u[IU]/mL (ref 0.35–5.50)

## 2024-04-06 LAB — HEMOGLOBIN A1C: Hgb A1c MFr Bld: 5.7 % (ref 4.6–6.5)

## 2024-04-06 LAB — VITAMIN D 25 HYDROXY (VIT D DEFICIENCY, FRACTURES): VITD: 41.2 ng/mL (ref 30.00–100.00)

## 2024-04-06 LAB — PSA: PSA: 2.6 ng/mL (ref 0.10–4.00)

## 2024-04-06 MED ORDER — TRAZODONE HCL 100 MG PO TABS: 100.0000 mg | ORAL_TABLET | Freq: Every day | ORAL | 1 refills | Status: DC

## 2024-04-06 MED ORDER — CYCLOBENZAPRINE HCL 10 MG PO TABS: 10.0000 mg | ORAL_TABLET | Freq: Two times a day (BID) | ORAL | 1 refills | Status: AC | PRN

## 2024-04-06 MED ORDER — DICLOFENAC SODIUM 75 MG PO TBEC: 75.0000 mg | DELAYED_RELEASE_TABLET | Freq: Two times a day (BID) | ORAL | 5 refills | Status: AC | PRN

## 2024-04-06 MED ORDER — LOSARTAN POTASSIUM-HCTZ 100-25 MG PO TABS
1.0000 | ORAL_TABLET | Freq: Every day | ORAL | 3 refills | Status: AC
Start: 2024-04-06 — End: ?

## 2024-04-06 MED ORDER — ROSUVASTATIN CALCIUM 10 MG PO TABS: 10.0000 mg | ORAL_TABLET | Freq: Every day | ORAL | 3 refills | Status: AC

## 2024-04-06 NOTE — Progress Notes (Signed)
 Patient ID: Carl Trujillo, male   DOB: 1950-11-20, 73 y.o.   MRN: [id number]         Chief Complaint:: wellness exam and htn, low vit d, hld, hyperglycemia, , low b12       HPI:  Carl Trujillo is a 73 y.o. male here for wellness exam; declines prevnar 20 today, for tdap at pharmacy, o/w up to date                        Also Pt denies chest pain, increased sob or doe, wheezing, orthopnea, PND, increased LE swelling, palpitations, dizziness or syncope.   Pt denies polydipsia, polyuria, or new focal neuro s/s.    Pt denies fever, wt loss, night sweats, loss of appetite, or other constitutional symptoms  BP has been controlled at home per pt. Good med compliance  Pt continues to have recurring LBP with mild increased change in severity, bowel or bladder change, fever, wt loss,  worsening LE pain/numbness/weakness, gait change or falls.   Wt Readings from Last 3 Encounters:  04/06/24 164 lb (74.4 kg)  09/25/23 162 lb (73.5 kg)  03/16/23 163 lb (73.9 kg)   BP Readings from Last 3 Encounters:  04/06/24 138/80  09/25/23 136/82  03/16/23 128/76   Immunization History  Administered Date(s) Administered   Fluad Quad(high Dose 65+) 06/29/2019, 09/27/2020, 07/01/2022   Influenza Split 06/27/2013, 08/22/2016, 07/13/2017   Influenza Whole 07/13/2008, 07/11/2009   Influenza, High Dose Seasonal PF 06/24/2018, 09/07/2023   Influenza-Unspecified 07/18/2014, 07/01/2021   PFIZER(Purple Top)SARS-COV-2 Vaccination 12/09/2019, 01/03/2020, 07/25/2020, 03/19/2021   Pneumococcal Polysaccharide-23 08/22/2016   Td 07/13/2008   Zoster Recombinant(Shingrix) 06/17/2021, 08/19/2021   Health Maintenance Due  Topic Date Due   Pneumococcal Vaccine: 50+ Years (2 of 2 - PCV) 08/22/2017   DTaP/Tdap/Td (2 - Tdap) 07/13/2018   Medicare Annual Wellness (AWV)  05/10/2023      Past Medical History:  Diagnosis Date   Agatston coronary artery calcium  score greater than 400 08/04/2022   Coronary Calcium  Score 07/11/2022:  Total Calcium  Score 574, LAD 493, LCx 25, RCA 56.    Anxiety    Arthritis of right knee    Arthritis of spine    ED (erectile dysfunction)    Hyperlipidemia 05/2022   Recently diagnosed: Added on rosuvastatin  and omega-3 fatty acids   Hypertension    History reviewed. No pertinent surgical history.  reports that he quit smoking about 26 years ago. His smoking use included cigars. He has never used smokeless tobacco. He reports that he does not currently use alcohol. He reports that he does not use drugs. family history includes CVA in his sister; Cancer in his mother and another family member; Coronary artery disease in an other family member; Post-traumatic stress disorder in his brother. No Known Allergies Current Outpatient Medications on File Prior to Visit  Medication Sig Dispense Refill   alprazolam  (XANAX ) 2 MG tablet TAKE 1/2 TO 1 (ONE-HALF TO ONE) TABLET BY MOUTH THREE TIMES DAILY AS NEEDED FOR ANXIETY AND  FOR  INSOMNIA 80 tablet 5   Ascorbic Acid (VITAMIN C) 1000 MG tablet Take 5,000 mg by mouth daily.      aspirin 81 MG tablet Take 81 mg by mouth daily.     Cascara Sagrada 450 MG CAPS Take by mouth.     cholecalciferol (VITAMIN D ) 1000 units tablet Take 1,000 Units by mouth daily.     Cod Liver Oil 1000 MG CAPS Take by  mouth.     Magnesium-Zinc 133.33-5 MG TABS Take by mouth.     Omega-3 Fatty Acids (OMEGA 3 PO) Take by mouth. Omega XL - 2 capsules daily     pyridoxine (B-6) 100 MG tablet Take 100 mg by mouth daily.     Specialty Vitamins Products (CENTRUM PERFORMANCE) TABS Take by mouth.     vitamin B-12 (CYANOCOBALAMIN ) 1000 MCG tablet Take 1,000 mcg by mouth daily.     No current facility-administered medications on file prior to visit.        ROS:  All others reviewed and negative.  Objective        PE:  BP 138/80 (BP Location: Left Arm, Patient Position: Sitting, Cuff Size: Normal)   Pulse 79   Temp 98 F (36.7 C) (Oral)   Ht 5' 10 (1.778 m)   Wt 164 lb (74.4  kg)   SpO2 96%   BMI 23.53 kg/m                 Constitutional: Pt appears in NAD               HENT: Head: NCAT.                Right Ear: External ear normal.                 Left Ear: External ear normal.                Eyes: . Pupils are equal, round, and reactive to light. Conjunctivae and EOM are normal               Nose: without d/c or deformity               Neck: Neck supple. Gross normal ROM               Cardiovascular: Normal rate and regular rhythm.                 Pulmonary/Chest: Effort normal and breath sounds without rales or wheezing.                Abd:  Soft, NT, ND, + BS, no organomegaly               Neurological: Pt is alert. At baseline orientation, motor grossly intact               Skin: Skin is warm. No rashes, no other new lesions, LE edema - none               Psychiatric: Pt behavior is normal without agitation   Micro: none  Cardiac tracings I have personally interpreted today:  none  Pertinent Radiological findings (summarize): none   Lab Results  Component Value Date   WBC 3.3 (L) 04/06/2024   HGB 13.2 04/06/2024   HCT 38.7 (L) 04/06/2024   PLT 226.0 04/06/2024   GLUCOSE 98 04/06/2024   CHOL 104 04/06/2024   TRIG 47.0 04/06/2024   HDL 52.60 04/06/2024   LDLCALC 42 04/06/2024   ALT 29 04/06/2024   AST 32 04/06/2024   NA 138 04/06/2024   K 4.1 04/06/2024   CL 100 04/06/2024   CREATININE 1.13 04/06/2024   BUN 15 04/06/2024   CO2 32 04/06/2024   TSH 2.51 04/06/2024   PSA 2.60 04/06/2024   HGBA1C 5.7 04/06/2024   Assessment/Plan:  Carl Trujillo is a 73 y.o. Black or African American [2] male with  has a past medical history of Agatston coronary artery calcium  score greater than 400 (08/04/2022), Anxiety, Arthritis of right knee, Arthritis of spine, ED (erectile dysfunction), Hyperlipidemia (05/2022), and Hypertension.  Encounter for well adult exam with abnormal findings Age and sex appropriate education and counseling updated with  regular exercise and diet Referrals for preventative services - none needed Immunizations addressed - for tdap at pharmacy, declines prevnar 20 Smoking counseling  - none needed Evidence for depression or other mood disorder - none significant Most recent labs reviewed. I have personally reviewed and have noted: 1) the patient's medical and social history 2) The patient's current medications and supplements 3) The patient's height, weight, and BMI have been recorded in the chart   Vitamin D  deficiency Last vitamin D  Lab Results  Component Value Date   VD25OH 41.20 04/06/2024   Stable, cont oral replacement   Mixed hyperlipidemia Lab Results  Component Value Date   LDLCALC 42 04/06/2024   Stable, pt to continue current statin crestor  10 mg qd   Insomnia Stalbe, cont trazodone  at bedtime prn  Hyperglycemia Lab Results  Component Value Date   HGBA1C 5.7 04/06/2024   Stable, pt to continue current medical treatment  - diet, wt control   Essential hypertension BP Readings from Last 3 Encounters:  04/06/24 138/80  09/25/23 136/82  03/16/23 128/76   Mild uncontrolled, pt states controlled at home, declines need for change at thist itme  , pt to cont hyzaar 100 25 qd   B12 deficiency Lab Results  Component Value Date   VITAMINB12 1,262 (H) 04/06/2024   Stable, cont oral replacement - b12 1000 mcg qd   LOW BACK PAIN, CHRONIC Chronic but mild worsening,, pt to continue flexeril  5 tid prn  Followup: Return in about 6 months (around 10/06/2024).  Lynwood Rush, MD 04/09/2024 3:16 PM [facility]

## 2024-04-06 NOTE — Progress Notes (Signed)
 The test results show that your current treatment is OK, as the tests are stable.  Please continue the same plan.  There is no other need for change of treatment or further evaluation based on these results, at this time.  thanks

## 2024-04-06 NOTE — Patient Instructions (Signed)

## 2024-04-09 ENCOUNTER — Encounter: Payer: Self-pay | Admitting: Internal Medicine

## 2024-04-09 NOTE — Assessment & Plan Note (Addendum)
 Chronic but mild worsening,, pt to continue flexeril  5 tid prn

## 2024-04-09 NOTE — Assessment & Plan Note (Signed)
 Lab Results  Component Value Date   VITAMINB12 1,262 (H) 04/06/2024   Stable, cont oral replacement - b12 1000 mcg qd

## 2024-04-09 NOTE — Assessment & Plan Note (Signed)
 BP Readings from Last 3 Encounters:  04/06/24 138/80  09/25/23 136/82  03/16/23 128/76   Mild uncontrolled, pt states controlled at home, declines need for change at thist itme  , pt to cont hyzaar 100 25 qd

## 2024-04-09 NOTE — Assessment & Plan Note (Signed)
 Lab Results  Component Value Date   LDLCALC 42 04/06/2024   Stable, pt to continue current statin crestor  10 mg qd

## 2024-04-09 NOTE — Assessment & Plan Note (Signed)
 Age and sex appropriate education and counseling updated with regular exercise and diet Referrals for preventative services - none needed Immunizations addressed - for tdap at pharmacy, declines prevnar 20 Smoking counseling  - none needed Evidence for depression or other mood disorder - none significant Most recent labs reviewed. I have personally reviewed and have noted: 1) the patient's medical and social history 2) The patient's current medications and supplements 3) The patient's height, weight, and BMI have been recorded in the chart

## 2024-04-09 NOTE — Assessment & Plan Note (Signed)
 Last vitamin D  Lab Results  Component Value Date   VD25OH 41.20 04/06/2024   Stable, cont oral replacement

## 2024-04-09 NOTE — Assessment & Plan Note (Signed)
 Lab Results  Component Value Date   HGBA1C 5.7 04/06/2024   Stable, pt to continue current medical treatment  - diet, wt control

## 2024-04-09 NOTE — Assessment & Plan Note (Signed)
 Stalbe, cont trazodone  at bedtime prn

## 2024-05-17 ENCOUNTER — Ambulatory Visit (INDEPENDENT_AMBULATORY_CARE_PROVIDER_SITE_OTHER): Payer: Medicare HMO | Admitting: Physician Assistant

## 2024-05-17 ENCOUNTER — Encounter: Payer: Self-pay | Admitting: Physician Assistant

## 2024-05-17 DIAGNOSIS — F5101 Primary insomnia: Secondary | ICD-10-CM | POA: Diagnosis not present

## 2024-05-17 DIAGNOSIS — F411 Generalized anxiety disorder: Secondary | ICD-10-CM

## 2024-05-17 MED ORDER — ALPRAZOLAM 2 MG PO TABS: ORAL_TABLET | ORAL | 5 refills | Status: DC

## 2024-05-17 NOTE — Progress Notes (Signed)
 [facility]  Patient ID: Rc Amison,  MRN: [id number]  PCP: Norleen Lynwood ORN, MD  Date of Evaluation: 05/17/2024 Time spent:25 minutes  Chief Complaint:  Chief Complaint   Anxiety; Insomnia; Follow-up    HISTORY/CURRENT STATUS: HPI  For 6 month med check.   States he's doing well. The Trazodone  is working well for sleep. PCP prescribes that. Takes the Xanax  in the evening and it's effective. He's been on this regimen for a long time without probs.   Patient is able to enjoy things.  Enjoys playing games on his phone. Plans to go see his sister who lives in [place].  Energy and motivation are good.  No extreme sadness, tearfulness, or feelings of hopelessness.  ADLs and personal hygiene are normal.   Denies any changes in concentration, making decisions, or remembering things.  Appetite has not changed.  Weight is stable.  No mania, delirium, AH/VH.  No SI/HI.  Individual Medical History/ Review of Systems: Changes? :No   Past medications for mental health diagnoses include: Xanax , trazodone   Allergies: Patient has no known allergies.  Current Medications:  Current Outpatient Medications:    Ascorbic Acid (VITAMIN C) 1000 MG tablet, Take 5,000 mg by mouth daily. , Disp: , Rfl:    aspirin 81 MG tablet, Take 81 mg by mouth daily., Disp: , Rfl:    Cascara Sagrada 450 MG CAPS, Take by mouth., Disp: , Rfl:    cholecalciferol (VITAMIN D ) 1000 units tablet, Take 1,000 Units by mouth daily., Disp: , Rfl:    Cod Liver Oil 1000 MG CAPS, Take by mouth., Disp: , Rfl:    cyclobenzaprine  (FLEXERIL ) 10 MG tablet, Take 1 tablet (10 mg total) by mouth 2 (two) times daily as needed., Disp: 180 tablet, Rfl: 1   diclofenac  (VOLTAREN ) 75 MG EC tablet, Take 1 tablet (75 mg total) by mouth 2 (two) times daily as needed., Disp: 60 tablet, Rfl: 5   losartan -hydrochlorothiazide (HYZAAR) 100-25 MG tablet, Take 1 tablet by mouth daily., Disp: 90 tablet, Rfl: 3   Magnesium-Zinc 133.33-5 MG TABS, Take  by mouth., Disp: , Rfl:    Omega-3 Fatty Acids (OMEGA 3 PO), Take by mouth. Omega XL - 2 capsules daily, Disp: , Rfl:    pyridoxine (B-6) 100 MG tablet, Take 100 mg by mouth daily., Disp: , Rfl:    rosuvastatin  (CRESTOR ) 10 MG tablet, Take 1 tablet (10 mg total) by mouth daily., Disp: 90 tablet, Rfl: 3   Specialty Vitamins Products (CENTRUM PERFORMANCE) TABS, Take by mouth., Disp: , Rfl:    traZODone  (DESYREL ) 100 MG tablet, Take 1 tablet (100 mg total) by mouth at bedtime., Disp: 90 tablet, Rfl: 1   vitamin B-12 (CYANOCOBALAMIN ) 1000 MCG tablet, Take 1,000 mcg by mouth daily., Disp: , Rfl:    alprazolam  (XANAX ) 2 MG tablet, TAKE 1/2 TO 1 (ONE-HALF TO ONE) TABLET BY MOUTH THREE TIMES DAILY AS NEEDED FOR ANXIETY AND  FOR  INSOMNIA, Disp: 80 tablet, Rfl: 5 Medication Side Effects: none  Family Medical/ Social History: Changes? No  MENTAL HEALTH EXAM:  There were no vitals taken for this visit.There is no height or weight on file to calculate BMI.  General Appearance: Casual, Well Groomed, and partially edentulous  Eye Contact:  Good  Speech:  Clear and Coherent and Normal Rate  Volume:  Normal  Mood:  Euthymic  Affect:  Congruent  Thought Process:  Goal Directed and Descriptions of Associations: Circumstantial  Orientation:  Full (Time, Place, and Person)  Thought Content: Logical   Suicidal Thoughts:  No  Homicidal Thoughts:  No  Memory:  WNL  Judgement:  Good  Insight:  Good  Psychomotor Activity:  Normal  Concentration:  Concentration: Good  Recall:  Good  Fund of Knowledge: Good  Language: Good  Assets:  Desire for Improvement Financial Resources/Insurance Housing Transportation  ADL's:  Intact  Cognition: WNL  Prognosis:  Good   DIAGNOSES:    ICD-10-CM   1. Primary insomnia  F51.01     2. Anxiety state  F41.1 alprazolam  (XANAX ) 2 MG tablet      Receiving Psychotherapy: No   RECOMMENDATIONS:   PDMP reviewed.  Xanax  filled 04/13/2024. I provided approximately  20  minutes of face to face time during this encounter, including time spent before and after the visit in records review, medical decision making, counseling pertinent to today's visit, and charting.   Benzos are not generally recommended over age 71 but he has been on this regimen for a long time without problems so no changes will be made.  Continue Xanax  2 mg, 1/2-1 p.o. 3 times daily as needed anxiety or sleep. Continue trazodone  100 mg, 1 p.o. nightly per PCP. Return in 6 months.  Verneita Cooks, PA-C

## 2024-06-15 ENCOUNTER — Ambulatory Visit

## 2024-06-15 VITALS — Ht 70.0 in | Wt 164.0 lb

## 2024-06-15 DIAGNOSIS — Z0000 Encounter for general adult medical examination without abnormal findings: Secondary | ICD-10-CM

## 2024-06-15 NOTE — Patient Instructions (Signed)
 [name] , Thank you for taking time out of your busy schedule to complete your Annual Wellness Visit with me. I enjoyed our conversation and look forward to speaking with you again next year. I, as well as your care team,  appreciate your ongoing commitment to your health goals. Please review the following plan we discussed and let me know if I can assist you in the future. Your Game plan/ To Do List    Follow up Visits: We will see or speak with you next year for your Next Medicare AWV with our clinical staff Have you seen your provider in the last 6 months (3 months if uncontrolled diabetes)? Yes.  Last office visit on 04/06/2024.  Clinician Recommendations:  Aim for 30 minutes of exercise or brisk walking, 6-8 glasses of water, and 5 servings of fruits and vegetables each day. You are due for a tetanus vaccine and a Flu vaccine, which you can get at your pharmacy.        This is a list of the screenings recommended for you:  Health Maintenance  Topic Date Due   Pneumococcal Vaccine for age over 47 (2 of 2 - PCV) 08/22/2017   DTaP/Tdap/Td vaccine (2 - Tdap) 07/13/2018   Medicare Annual Wellness Visit  05/10/2023   Flu Shot  05/13/2024   Colon Cancer Screening  01/22/2028   Hepatitis C Screening  Completed   Zoster (Shingles) Vaccine  Completed   HPV Vaccine  Aged Out   Meningitis B Vaccine  Aged Out   COVID-19 Vaccine  Discontinued    Advanced directives: (Declined) Advance directive discussed with you today. Even though you declined this today, please call our office should you change your mind, and we can give you the proper paperwork for you to fill out. Advance Care Planning is important because it:  [x]  Makes sure you receive the medical care that is consistent with your values, goals, and preferences  [x]  It provides guidance to your family and loved ones and reduces their decisional burden about whether or not they are making the right decisions based on your  wishes.  Follow the link provided in your after visit summary or read over the paperwork we have mailed to you to help you started getting your Advance Directives in place. If you need assistance in completing these, please reach out to us  so that we can help you!  See attachments for Preventive Care and Fall Prevention Tips.

## 2024-06-15 NOTE — Progress Notes (Signed)
 Subjective:   Carl Trujillo is a 73 y.o. who presents for a Medicare Wellness preventive visit.  As a reminder, Annual Wellness Visits don't include a physical exam, and some assessments may be limited, especially if this visit is performed virtually. We may recommend an in-person follow-up visit with your provider if needed.  Visit Complete: Virtual I connected with  Carl Trujillo on 06/15/24 by a audio enabled telemedicine application and verified that I am speaking with the correct person using two identifiers.  Patient Location: Home  Provider Location: Home Office  I discussed the limitations of evaluation and management by telemedicine. The patient expressed understanding and agreed to proceed.  Vital Signs: Because this visit was a virtual/telehealth visit, some criteria may be missing or patient reported. Any vitals not documented were not able to be obtained and vitals that have been documented are patient reported.  VideoDeclined- This patient declined Librarian, academic. Therefore the visit was completed with audio only.  Persons Participating in Visit: Patient.  AWV Questionnaire: No: Patient Medicare AWV questionnaire was not completed prior to this visit.  Cardiac Risk Factors include: advanced age (>60men, >10 women);hypertension;male gender;Other (see comment);dyslipidemia, Risk factor comments: OSA     Objective:    Today's Vitals   06/15/24 1052  Weight: 164 lb (74.4 kg)  Height: 5' 10 (1.778 m)   Body mass index is 23.53 kg/m.     06/15/2024   11:04 AM 05/09/2022    1:40 PM 03/28/2020    2:29 PM 03/17/2018    4:22 PM  Advanced Directives  Does Patient Have a Medical Advance Directive? No No Yes Yes   Type of Surveyor, minerals;Living will Healthcare Power of [place];Living will  Does patient want to make changes to medical advance directive?   No - Patient declined   Copy of Healthcare Power of  Attorney in Chart?   No - copy requested No - copy requested   Would patient like information on creating a medical advance directive?  No - Patient declined       Data saved with a previous flowsheet row definition    Current Medications (verified) Outpatient Encounter Medications as of 06/15/2024  Medication Sig   alprazolam  (XANAX ) 2 MG tablet TAKE 1/2 TO 1 (ONE-HALF TO ONE) TABLET BY MOUTH THREE TIMES DAILY AS NEEDED FOR ANXIETY AND  FOR  INSOMNIA   Ascorbic Acid (VITAMIN C) 1000 MG tablet Take 5,000 mg by mouth daily.    aspirin 81 MG tablet Take 81 mg by mouth daily.   C[place] 450 MG CAPS Take by mouth.   cholecalciferol (VITAMIN D ) 1000 units tablet Take 1,000 Units by mouth daily.   Cod Liver Oil 1000 MG CAPS Take by mouth.   cyclobenzaprine  (FLEXERIL ) 10 MG tablet Take 1 tablet (10 mg total) by mouth 2 (two) times daily as needed.   diclofenac  (VOLTAREN ) 75 MG EC tablet Take 1 tablet (75 mg total) by mouth 2 (two) times daily as needed.   losartan -hydrochlorothiazide (HYZAAR) 100-25 MG tablet Take 1 tablet by mouth daily.   Magnesium-Zinc 133.33-5 MG TABS Take by mouth.   Omega-3 Fatty Acids (OMEGA 3 PO) Take by mouth. Omega XL - 2 capsules daily   pyridoxine (B-6) 100 MG tablet Take 100 mg by mouth daily.   rosuvastatin  (CRESTOR ) 10 MG tablet Take 1 tablet (10 mg total) by mouth daily.   Specialty Vitamins Products (CENTRUM PERFORMANCE) TABS Take by mouth.  traZODone  (DESYREL ) 100 MG tablet Take 1 tablet (100 mg total) by mouth at bedtime.   vitamin B-12 (CYANOCOBALAMIN ) 1000 MCG tablet Take 1,000 mcg by mouth daily.   No facility-administered encounter medications on file as of 06/15/2024.    Allergies (verified) Patient has no known allergies.   History: Past Medical History:  Diagnosis Date   Agatston coronary artery calcium  score greater than 400 08/04/2022   Coronary Calcium  Score 07/11/2022: Total Calcium  Score 574, LAD 493, LCx 25, RCA 56.    Anxiety     Arthritis of right knee    Arthritis of spine    ED (erectile dysfunction)    Hyperlipidemia 05/2022   Recently diagnosed: Added on rosuvastatin  and omega-3 fatty acids   Hypertension    History reviewed. No pertinent surgical history. Family History  Problem Relation Age of Onset   Cancer Other    Coronary artery disease Other    CVA Sister    Cancer Mother    Post-traumatic stress disorder Brother    Social History   Socioeconomic History   Marital status: Single    Spouse name: Not on file   Number of children: Not on file   Years of education: Not on file   Highest education level: 10th grade  Occupational History   Occupation: retired  Tobacco Use   Smoking status: Former    Types: Cigars    Quit date: 10/13/1997    Years since quitting: 26.6   Smokeless tobacco: Never   Tobacco comments:    current smoker of cigars in the pm - stopped cigarettes 46yrs ago  Vaping Use   Vaping status: Never Used  Substance and Sexual Activity   Alcohol use: Not Currently   Drug use: No   Sexual activity: Not Currently  Other Topics Concern   Not on file  Social History Narrative   Saurav says he has been taking care of himself since he was age 14.  His parents were essentially nonexistent.  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   (He tells me that he worked at the [employer] )    He has not been working since early 60s.  He just takes care of himself.  He lives alone after his long-term friend passed.  He used to walk quite a bit but then stopped doing that once his friend was no longer available.   Social Drivers of Corporate investment banker Strain: Low Risk  (06/15/2024)   Overall Financial Resource Strain (CARDIA)    Difficulty of Paying Living Expenses: Not hard at all  Food Insecurity: No Food Insecurity (06/15/2024)   Hunger Vital Sign    Worried About Running Out of Food in the Last Year: Never true     Ran Out of Food in the Last Year: Never true  Transportation Needs: No Transportation Needs (06/15/2024)   PRAPARE - Administrator, Civil Service (Medical): No    Lack of Transportation (Non-Medical): No  Physical Activity: Sufficiently Active (06/15/2024)   Exercise Vital Sign    Days of Exercise per Week: 3 days    Minutes of Exercise per Session: 60 min  Stress: No Stress Concern Present (06/15/2024)   [employer] of Occupational Health - Occupational Stress Questionnaire    Feeling of Stress: Not at all  Social Connections: Moderately Isolated (06/15/2024)   Social Connection and Isolation Panel    Frequency of  Communication with Friends and Family: Once a week    Frequency of Social Gatherings with Friends and Family: Twice a week    Attends Religious Services: 1 to 4 times per year    Active Member of [employer] or Organizations: No    Attends Engineer, structural: Never    Marital Status: Divorced    Tobacco Counseling Counseling given: Not Answered Tobacco comments: current smoker of cigars in the pm - stopped cigarettes 2yrs ago    Clinical Intake:  Pre-visit preparation completed: Yes  Pain : No/denies pain     BMI - recorded: 23.53 Nutritional Status: BMI of 19-24  Normal Nutritional Risks: None Diabetes: No  Lab Results  Component Value Date   HGBA1C 5.7 04/06/2024   HGBA1C 5.6 03/12/2023   HGBA1C 5.7 07/01/2022     How often do you need to have someone help you when you read instructions, pamphlets, or other written materials from your doctor or pharmacy?: 1 - Never  Interpreter Needed?: No  Information entered by :: Shakisha Abend, RMA   Activities of Daily Living     06/15/2024   10:54 AM  In your present state of health, do you have any difficulty performing the following activities:  Hearing? 0  Vision? 0  Difficulty concentrating or making decisions? 0  Walking or climbing stairs? 0  Dressing or bathing? 0  Doing  errands, shopping? 0  Preparing Food and eating ? N  Using the Toilet? N  In the past six months, have you accidently leaked urine? N  Do you have problems with loss of bowel control? N  Managing your Medications? N  Managing your Finances? N  Housekeeping or managing your Housekeeping? N    Patient Care Team: Norleen Carl ORN, MD as PCP - General (Internal Medicine) Anner Alm ORN, MD as PCP - Cardiology (Cardiology) Nieves Cough, MD as Consulting Physician (Urology) Neysa Reggy BIRCH, MD as Consulting Physician (Pulmonary Disease)  I have updated your Care Teams any recent Medical Services you may have received from other providers in the past year.     Assessment:   This is a routine wellness examination for [place]ba.  Hearing/Vision screen Hearing Screening - Comments:: Denies hearing difficulties   Vision Screening - Comments:: Denies vision issues. Dr. Roz    Goals Addressed             This Visit's Progress    Patient Stated   On track    Stay as healthy and as independent as possible. Enjoy life, family, and travel.       Depression Screen     04/06/2024    1:59 PM 03/16/2023    9:49 AM 10/22/2022    9:03 AM 10/22/2022    8:49 AM 07/01/2022   10:59 AM 07/01/2022   10:40 AM 05/09/2022    1:36 PM  PHQ 2/9 Scores  PHQ - 2 Score 0 0 1 0 0 0 0  PHQ- 9 Score 0   0  0     Fall Risk     06/15/2024   11:05 AM 04/06/2024    1:59 PM 03/16/2023    9:49 AM 10/22/2022    9:02 AM 10/22/2022    8:49 AM  Fall Risk   Falls in the past year? 0 0 0 0 0  Number falls in past yr: 0 0 0 0 0  Injury with Fall? 0 0 0 0 0  Risk for fall due to :  No Fall Risks  No Fall Risks  Follow up Falls evaluation completed;Falls prevention discussed Falls evaluation completed Falls evaluation completed  Falls evaluation completed      Data saved with a previous flowsheet row definition    MEDICARE RISK AT HOME:  Medicare Risk at Home Any stairs in or around the home?: Yes (coming  in the house) If so, are there any without handrails?: No Home free of loose throw rugs in walkways, pet beds, electrical cords, etc?: Yes Adequate lighting in your home to reduce risk of falls?: Yes Life alert?: No Use of a cane, walker or w/c?: No Grab bars in the bathroom?: No Shower chair or bench in shower?: No Elevated toilet seat or a handicapped toilet?: Yes  TIMED UP AND GO:  Was the test performed?  No  Cognitive Function: Declined/Normal: No cognitive concerns noted by patient or family. Patient alert, oriented, able to answer questions appropriately and recall recent events. No signs of memory loss or confusion.    03/17/2018    4:46 PM  MMSE - Mini Mental State Exam  Orientation to time 5  Orientation to Place 5  Registration 3  Attention/ Calculation 5  Recall 2  Language- name 2 objects 2  Language- repeat 1  Language- follow 3 step command 3  Language- read & follow direction 1  Write a sentence 1  Copy design 1  Total score 29        05/09/2022    1:40 PM 03/28/2020    4:15 PM  6CIT Screen  What Year? 0 points 0 points  What month? 0 points 0 points  What time? 0 points 0 points  Count back from 20 0 points 0 points  Months in reverse 0 points 0 points  Repeat phrase 0 points 0 points  Total Score 0 points 0 points    Immunizations Immunization History  Administered Date(s) Administered   Fluad Quad(high Dose 65+) 06/29/2019, 09/27/2020, 07/01/2022   INFLUENZA, HIGH DOSE SEASONAL PF 06/24/2018, 09/07/2023   Influenza Split 06/27/2013, 08/22/2016, 07/13/2017   Influenza Whole 07/13/2008, 07/11/2009   Influenza-Unspecified 07/18/2014, 07/01/2021   PFIZER(Purple Top)SARS-COV-2 Vaccination 12/09/2019, 01/03/2020, 07/25/2020, 03/19/2021   PNEUMOCOCCAL CONJUGATE-20 03/19/2021   Pneumococcal Polysaccharide-23 08/22/2016   Td 07/13/2008   Zoster Recombinant(Shingrix) 06/17/2021, 08/19/2021    Screening Tests Health Maintenance  Topic Date Due    DTaP/Tdap/Td (2 - Tdap) 07/13/2018   INFLUENZA VACCINE  05/13/2024   Medicare Annual Wellness (AWV)  06/15/2025   Colonoscopy  01/22/2028   Pneumococcal Vaccine: 50+ Years  Completed   Hepatitis C Screening  Completed   Zoster Vaccines- Shingrix  Completed   HPV VACCINES  Aged Out   Meningococcal B Vaccine  Aged Out   COVID-19 Vaccine  Discontinued    Health Maintenance  Health Maintenance Due  Topic Date Due   DTaP/Tdap/Td (2 - Tdap) 07/13/2018   INFLUENZA VACCINE  05/13/2024   Health Maintenance Items Addressed: See Nurse Notes at the end of this note  Additional Screening:  Vision Screening: Recommended annual ophthalmology exams for early detection of glaucoma and other disorders of the eye. Would you like a referral to an eye doctor? No    Dental Screening: Recommended annual dental exams for proper oral hygiene  Community Resource Referral / Chronic Care Management: CRR required this visit?  No   CCM required this visit?  No   Plan:    I have personally reviewed and noted the following in the patient's chart:  Medical and social history Use of alcohol, tobacco or illicit drugs  Current medications and supplements including opioid prescriptions. Patient is not currently taking opioid prescriptions. Functional ability and status Nutritional status Physical activity Advanced directives List of other physicians Hospitalizations, surgeries, and ER visits in previous 12 months Vitals Screenings to include cognitive, depression, and falls Referrals and appointments  In addition, I have reviewed and discussed with patient certain preventive protocols, quality metrics, and best practice recommendations. A written personalized care plan for preventive services as well as general preventive health recommendations were provided to patient.   Hildagarde Holleran L Phillip Sandler, CMA   06/15/2024   After Visit Summary: (MyChart) Due to this being a telephonic visit, the after visit  summary with patients personalized plan was offered to patient via MyChart   Notes: Patient is due for a Tdap and a Flu vaccine.  He is up to date on all other health maintenance and had no concerns to address today.

## 2024-10-25 ENCOUNTER — Other Ambulatory Visit: Payer: Self-pay | Admitting: Internal Medicine

## 2024-10-25 DIAGNOSIS — F5101 Primary insomnia: Secondary | ICD-10-CM

## 2024-11-15 ENCOUNTER — Encounter: Payer: Self-pay | Admitting: Physician Assistant

## 2024-11-15 ENCOUNTER — Ambulatory Visit: Admitting: Physician Assistant

## 2024-11-15 DIAGNOSIS — F5101 Primary insomnia: Secondary | ICD-10-CM

## 2024-11-15 DIAGNOSIS — F411 Generalized anxiety disorder: Secondary | ICD-10-CM

## 2024-11-15 MED ORDER — ALPRAZOLAM 2 MG PO TABS: ORAL_TABLET | ORAL | 5 refills | Status: AC

## 2024-11-15 MED ORDER — TRAZODONE HCL 100 MG PO TABS: 100.0000 mg | ORAL_TABLET | Freq: Every day | ORAL | 1 refills | Status: AC

## 2025-05-02 ENCOUNTER — Encounter: Admitting: Family Medicine

## 2025-05-16 ENCOUNTER — Ambulatory Visit: Admitting: Physician Assistant

## 2025-06-16 ENCOUNTER — Ambulatory Visit
